# Patient Record
Sex: Female | Born: 1980
Health system: Southern US, Community
[De-identification: ages and names within clinical notes are randomized; demographics above are authoritative.]

## PROBLEM LIST (undated history)

## (undated) DIAGNOSIS — G43909 Migraine, unspecified, not intractable, without status migrainosus: Secondary | ICD-10-CM

## (undated) DIAGNOSIS — G35 Multiple sclerosis: Secondary | ICD-10-CM

## (undated) DIAGNOSIS — E119 Type 2 diabetes mellitus without complications: Secondary | ICD-10-CM

## (undated) DIAGNOSIS — R7309 Other abnormal glucose: Secondary | ICD-10-CM

## (undated) DIAGNOSIS — E785 Hyperlipidemia, unspecified: Secondary | ICD-10-CM

## (undated) DIAGNOSIS — F329 Major depressive disorder, single episode, unspecified: Secondary | ICD-10-CM

## (undated) DIAGNOSIS — F419 Anxiety disorder, unspecified: Secondary | ICD-10-CM

## (undated) DIAGNOSIS — F32A Depression, unspecified: Secondary | ICD-10-CM

## (undated) HISTORY — DX: Hyperlipidemia, unspecified: E78.5

## (undated) HISTORY — PX: ABDOMINOPLASTY: SUR9

## (undated) HISTORY — DX: Other abnormal glucose: R73.09

## (undated) HISTORY — DX: Major depressive disorder, single episode, unspecified: F32.9

## (undated) HISTORY — DX: Depression, unspecified: F32.A

## (undated) HISTORY — DX: Anxiety disorder, unspecified: F41.9

---
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
## 2012-08-18 LAB — HM DIABETES EYE EXAM

## 2013-10-27 ENCOUNTER — Emergency Department
Admission: EM | Admit: 2013-10-27 | Discharge: 2013-10-27 | Disposition: A | Discharge status: Home / Self Care | Payer: BC Managed Care – PPO | Source: Home / Self Care | Attending: Family Medicine | Admitting: Family Medicine

## 2013-10-27 ENCOUNTER — Encounter: Payer: Self-pay | Admitting: Emergency Medicine

## 2013-10-27 DIAGNOSIS — G43009 Migraine without aura, not intractable, without status migrainosus: Secondary | ICD-10-CM

## 2013-10-27 HISTORY — DX: Migraine, unspecified, not intractable, without status migrainosus: G43.909

## 2013-10-27 HISTORY — DX: Multiple sclerosis: G35

## 2013-10-27 HISTORY — DX: Type 2 diabetes mellitus without complications: E11.9

## 2013-10-27 MED ORDER — PROMETHAZINE HCL 25 MG PO TABS: 25.0000 mg | ORAL_TABLET | Freq: Four times a day (QID) | ORAL | Status: DC | PRN

## 2013-10-27 MED ORDER — KETOROLAC TROMETHAMINE 60 MG/2ML IM SOLN
60.0000 mg | Freq: Once | INTRAMUSCULAR | Status: AC
  Administered 2013-10-27: 60 mg via INTRAMUSCULAR

## 2013-10-27 NOTE — Discharge Instructions (Signed)
Rest, increase fluid intake.   Migraine Headache A migraine headache is an intense, throbbing pain on one or both sides of your head. A migraine can last for 30 minutes to several hours. CAUSES  The exact cause of a migraine headache is not always known. However, a migraine may be caused when nerves in the brain become irritated and release chemicals that cause inflammation. This causes pain. Certain things may also trigger migraines, such as:  Alcohol.  Smoking.  Stress.  Menstruation.  Aged cheeses.  Foods or drinks that contain nitrates, glutamate, aspartame, or tyramine.  Lack of sleep.  Chocolate.  Caffeine.  Hunger.  Physical exertion.  Fatigue.  Medicines used to treat chest pain (nitroglycerine), birth control pills, estrogen, and some blood pressure medicines. SIGNS AND SYMPTOMS  Pain on one or both sides of your head.  Pulsating or throbbing pain.  Severe pain that prevents daily activities.  Pain that is aggravated by any physical activity.  Nausea, vomiting, or both.  Dizziness.  Pain with exposure to bright lights, loud noises, or activity.  General sensitivity to bright lights, loud noises, or smells. Before you get a migraine, you may get warning signs that a migraine is coming (aura). An aura may include:  Seeing flashing lights.  Seeing bright spots, halos, or zig-zag lines.  Having tunnel vision or blurred vision.  Having feelings of numbness or tingling.  Having trouble talking.  Having muscle weakness. DIAGNOSIS  A migraine headache is often diagnosed based on:  Symptoms.  Physical exam.  A CT scan or MRI of your head. These imaging tests cannot diagnose migraines, but they can help rule out other causes of headaches. TREATMENT Medicines may be given for pain and nausea. Medicines can also be given to help prevent recurrent migraines.  HOME CARE INSTRUCTIONS  Only take over-the-counter or prescription medicines for pain  or discomfort as directed by your health care provider. The use of long-term narcotics is not recommended.  Lie down in a dark, quiet room when you have a migraine.  Keep a journal to find out what may trigger your migraine headaches. For example, write down:  What you eat and drink.  How much sleep you get.  Any change to your diet or medicines.  Limit alcohol consumption.  Quit smoking if you smoke.  Get 7-9 hours of sleep, or as recommended by your health care provider.  Limit stress.  Keep lights dim if bright lights bother you and make your migraines worse. SEEK IMMEDIATE MEDICAL CARE IF:   Your migraine becomes severe.  You have a fever.  You have a stiff neck.  You have vision loss.  You have muscular weakness or loss of muscle control.  You start losing your balance or have trouble walking.  You feel faint or pass out.  You have severe symptoms that are different from your first symptoms. MAKE SURE YOU:   Understand these instructions.  Will watch your condition.  Will get help right away if you are not doing well or get worse. Document Released: 04/26/2005 Document Revised: 02/14/2013 Document Reviewed: 01/01/2013 [facility] Patient Information 2015 [employer] [place]. This information is not intended to replace advice given to you by your health care provider. Make sure you discuss any questions you have with your health care provider.

## 2013-10-27 NOTE — ED Provider Notes (Signed)
CSN: [id number]     Arrival date & time 10/27/13  [id number] History   First MD Initiated Contact with Patient 10/27/13 1000     Chief Complaint  Patient presents with  . Migraine      HPI Comments: Patient developed a left side migraine headache at 2am today that did not respond to two Maxalt tabs.  She has had persistent nausea/vomiting.  She normally has a milder migraine headache about once per month that responds to Maxalt and Phenergan.  She no longer has Phenergan at home.  She has had three or four previous severe left sided migraine headaches similar to present headache that did not respond to her usual medication.  No fevers, chills, and sweats.  Patient is a 33 y.o. female presenting with migraines. The history is provided by the patient and the spouse.  Migraine This is a recurrent problem. The current episode started 6 to 12 hours ago. The problem occurs constantly. The problem has not changed since onset.Associated symptoms comments: Nausea and vomiting. Exacerbated by: movement, and exposure to light. Nothing relieves the symptoms. Treatments tried: Maxalt. The treatment provided no relief.    Past Medical History  Diagnosis Date  . Multiple sclerosis     receives Tysabri infusion q 28 days  . Diabetes type 2, controlled   . Migraine    Past Surgical History  Procedure Laterality Date  . Cholecystectomy    . Cesarean section     Family History  Problem Relation Age of Onset  . Diabetes Mother   . Hypertension Mother   . Heart failure Father   . Hypertension Father    History  Substance Use Topics  . Smoking status: Never Smoker   . Smokeless tobacco: Not on file  . Alcohol Use: Yes   OB History   Grav Para Term Preterm Abortions TAB SAB Ect Mult Living                 Review of Systems  All other systems reviewed and are negative.   Allergies  Review of patient's allergies indicates no known allergies.  Home Medications   Prior to Admission medications    Medication Sig Start Date End Date Taking? Authorizing Provider  amphetamine-dextroamphetamine (ADDERALL XR) 20 MG 24 hr capsule Take 20 mg by mouth daily.   Yes Historical Provider, MD  metFORMIN (GLUMETZA) 1000 MG (MOD) 24 hr tablet Take 1,000 mg by mouth 2 (two) times daily with a meal.   Yes Historical Provider, MD  rizatriptan (MAXALT) 10 MG tablet Take 10 mg by mouth as needed for migraine. May repeat in 2 hours if needed   Yes Historical Provider, MD  promethazine (PHENERGAN) 25 MG tablet Take 1 tablet (25 mg total) by mouth every 6 (six) hours as needed for nausea or vomiting. 10/27/13   Lattie HawStephen A Beese, MD   BP 115/80  Pulse 84  Temp(Src) 97.7 F (36.5 C) (Oral)  Resp 16  Ht 6' (1.829 m)  Wt 240 lb (108.863 kg)  BMI 32.54 kg/m2  SpO2 99%  LMP 10/03/2013 Physical Exam Nursing notes and Vital Signs reviewed. Appearance:  Patient appears stated age, and in no acute distress, although she appears uncomfortable sitting in a darkened room.  Patient is obese (BMI 32.5) Eyes:  Pupils are equal, round, and reactive to light and accomodation.  Extraocular movement is intact.  Conjunctivae are not inflamed.  Fundi are benign.  Mild photophobia present. Ears:  Canals normal.  Tympanic membranes normal.  Nose:   Normal turbinates.  No sinus tenderness.   Mouth/Pharynx:  Normal Neck:  Supple.  No adenopathy. Lungs:  Clear to auscultation.  Breath sounds are equal.  Heart:  Regular rate and rhythm without murmurs, rubs, or gallops.  Abdomen:  Nontender .  Extremities:  No edema.   Skin:  No rash present.  Neurologic:  Cranial nerves 2 through 12 are normal.  Patellar reflexes are normal.  Cerebellar function is intact (finger-to-nose and rapid alternating hand movement).  Gait and station are normal.   ED Course  Procedures  none      MDM   1. Migraine without aura and without status migrainosus, not intractable    Zofran 4mg  IM, and Toradol 60mg  IM. Rx for Phenergan 25mg  po for  use at home prn If symptoms become significantly worse during the night or over the weekend, proceed to the local emergency room.     Lattie Haw, MD 10/27/13 1126

## 2013-10-27 NOTE — ED Notes (Signed)
Reports sudden onset of migraine middle of last night accompanied by nausea and vomiting numerous time; did take Maxalt, but did not have phenergan with her (in process of moving).

## 2013-12-27 DIAGNOSIS — G35 Multiple sclerosis: Secondary | ICD-10-CM | POA: Insufficient documentation

## 2014-01-08 ENCOUNTER — Telehealth: Payer: Self-pay | Admitting: Diagnostic Neuroimaging

## 2014-01-08 ENCOUNTER — Ambulatory Visit (INDEPENDENT_AMBULATORY_CARE_PROVIDER_SITE_OTHER): Payer: BC Managed Care – PPO | Admitting: Diagnostic Neuroimaging

## 2014-01-08 ENCOUNTER — Encounter: Payer: Self-pay | Admitting: Diagnostic Neuroimaging

## 2014-01-08 VITALS — BP 121/83 | HR 75 | Ht 72.0 in | Wt 244.4 lb

## 2014-01-08 DIAGNOSIS — G35 Multiple sclerosis: Secondary | ICD-10-CM | POA: Insufficient documentation

## 2014-01-08 DIAGNOSIS — R5381 Other malaise: Secondary | ICD-10-CM

## 2014-01-08 DIAGNOSIS — R5383 Other fatigue: Secondary | ICD-10-CM

## 2014-01-08 NOTE — Progress Notes (Signed)
[facility]  PATIENT: Anita Black DOB: [birth year]  REFERRING CLINICIAN: Drucilla Chalet HISTORY FROM: patient  REASON FOR VISIT: new consult    HISTORICAL  CHIEF COMPLAINT:  Chief Complaint  Patient presents with  . Multiple Sclerosis    HISTORY OF PRESENT ILLNESS:   33 year old right-handed female here for evaluation of multiple sclerosis.  2008 patient developed onset of burning in her feet. She then developed numbness from her neck down to her feet just in front of her body. Patient went to a neurologist and was diagnosed with transverse myelitis, possibly clinically isolated syndrome. She had MRI, lumbar puncture, blood testing. Apparently there were 2 spots on her spinal cord but no brain lesions. She was started on Rebif, and did well for 6 months until she had a relapse. She was switched to Copaxone and did well for another 6-8 months but then had a relapse. She was then switched to Avonex and 2010 until 2013 when she became pregnant. She stopped Avonex. After her pregnancy she was started on July the from April until October 2014. Patient developed emotional changes, mood lability, hair loss. Patient started on Tysabri on January 2013. Patient did well until recently. Patient moved to [place] from [place], and has had trouble coordinating her Tysabri infusions. Her last infusion was 11/29/2013. She was due to have infusion on 12/27/2013 but this could not be arranged due to coordination problems.  In last 2 weeks having some decr sens in right eye, right ey pain, numbness in hands, word finding diff, more fatigue.   Anti-JC virus antibody was negative from June 2015. Last MRI brain November 2014.   REVIEW OF SYSTEMS: Full 14 system review of systems performed and notable only for fatigue ringing in ears blurred vision eye pain depression anxiety decr energy headache numbness slurred speech.  ALLERGIES: No Known Allergies  HOME  MEDICATIONS: Outpatient Prescriptions Prior to Visit  Medication Sig Dispense Refill  . amphetamine-dextroamphetamine (ADDERALL XR) 20 MG 24 hr capsule Take 20 mg by mouth daily.      . metFORMIN (GLUMETZA) 1000 MG (MOD) 24 hr tablet Take 1,000 mg by mouth 2 (two) times daily with a meal.      . promethazine (PHENERGAN) 25 MG tablet Take 1 tablet (25 mg total) by mouth every 6 (six) hours as needed for nausea or vomiting.  12 tablet  0  . rizatriptan (MAXALT) 10 MG tablet Take 10 mg by mouth as needed for migraine. May repeat in 2 hours if needed       No facility-administered medications prior to visit.    PAST MEDICAL HISTORY: Past Medical History  Diagnosis Date  . Multiple sclerosis     receives Tysabri infusion q 28 days  . Diabetes type 2, controlled   . Migraine   . Anxiety   . Depression     PAST SURGICAL HISTORY: Past Surgical History  Procedure Laterality Date  . Cholecystectomy    . Cesarean section    . Abdominoplasty      FAMILY HISTORY: Family History  Problem Relation Age of Onset  . Diabetes Mother   . Hypertension Mother   . Dementia Mother   . Bipolar disorder Mother   . Heart failure Father   . Hypertension Father   . Heart disease Father   . Skin cancer Maternal Grandmother   . Alzheimer's disease Paternal Grandmother     SOCIAL HISTORY:  History   Social History  . Marital Status:  Married    Spouse Name: Rollen Sox    Number of Children: 1  . Years of Education: MA   Occupational History  .  [employer]   Social History Main Topics  . Smoking status: Never Smoker   . Smokeless tobacco: Never Used  . Alcohol Use: Yes     Comment: socially; once a week  . Drug Use: No  . Sexual Activity: Not on file   Other Topics Concern  . Not on file   Social History Narrative   Patient lives at home with family.   Caffeine Use: 1-2 cups daily     PHYSICAL EXAM  Filed Vitals:   01/08/14 0850  BP: 121/83  Pulse: 75  Height: 6'  (1.829 m)  Weight: 244 lb 6.4 oz (110.859 kg)    Not recorded    Body mass index is 33.14 kg/(m^2).  GENERAL EXAM: Patient is in no distress; well developed, nourished and groomed; neck is supple  CARDIOVASCULAR: Regular rate and rhythm, no murmurs, no carotid bruits  NEUROLOGIC: MENTAL STATUS: awake, alert, oriented to person, place and time, recent and remote memory intact, normal attention and concentration, language fluent, comprehension intact, naming intact, fund of knowledge appropriate CRANIAL NERVE: no papilledema on fundoscopic exam, RELATIVE RIGHT APD, DECR LIGHT SENS IN RIGHT EYE, pupils reactive to light, visual fields full to confrontation, extraocular muscles intact, no nystagmus, facial sensation and strength symmetric, hearing intact, palate elevates symmetrically, uvula midline, shoulder shrug symmetric, tongue midline. MOTOR: normal bulk and tone, full strength in the BUE, LLE; RLE (KE 4+, DF 4+) SENSORY: normal and symmetric to light touch, pinprick, temperature, vibration COORDINATION: finger-nose-finger, fine finger movements normal REFLEXES: deep tendon reflexes present and symmetric GAIT/STATION: narrow based gait; able to walk on toes and tandem; SLIGHT DIFF WITH HEEL WALKING ON RIGHT FOOT. Romberg is negative    DIAGNOSTIC DATA (LABS, IMAGING, TESTING) - I reviewed patient records, labs, notes, testing and imaging myself where available.  No results found for this basename: WBC, HGB, HCT, MCV, PLT   No results found for this basename: na, k, cl, co2, glucose, bun, creatinine, calcium, prot, albumin, ast, alt, alkphos, bilitot, gfrnonaa, gfraa   No results found for this basename: CHOL, HDL, LDLCALC, LDLDIRECT, TRIG, CHOLHDL   No results found for this basename: HGBA1C   No results found for this basename: VITAMINB12   No results found for this basename: TSH     ASSESSMENT AND PLAN  33 y.o. year old female here with multiple sclerosis, on tysabri.  Previously on rebif, copaxone, avonex, gilenya.   PLAN: - will setup tysabri; forms signed - repeat MRI; may consider steroids if evidence of relapse  Orders Placed This Encounter  Procedures  . MR Brain W Wo Contrast   Return in about 3 months (around 04/09/2014).    Suanne Marker, MD 01/08/2014, 10:05 AM Certified in Neurology, Neurophysiology and Neuroimaging  [facility] [address], [address] [place], [place] [address] [phone]

## 2014-01-08 NOTE — Telephone Encounter (Signed)
Patient calling with previous Neurologist info: Dr. Griffith Citron [facility] [phone]. Fax # [phone].  Please call with any questions.

## 2014-01-08 NOTE — Patient Instructions (Signed)
Will check MRI brain and restart tysabri infusions.

## 2014-01-08 NOTE — Telephone Encounter (Signed)
Faxed medical record request.

## 2014-01-10 ENCOUNTER — Ambulatory Visit (INDEPENDENT_AMBULATORY_CARE_PROVIDER_SITE_OTHER): Payer: BC Managed Care – PPO

## 2014-01-10 DIAGNOSIS — G35 Multiple sclerosis: Secondary | ICD-10-CM

## 2014-01-10 MED ORDER — GADOPENTETATE DIMEGLUMINE 469.01 MG/ML IV SOLN: 20.0000 mL | Freq: Once | INTRAVENOUS | Status: AC | PRN

## 2014-01-11 ENCOUNTER — Telehealth: Payer: Self-pay | Admitting: Diagnostic Neuroimaging

## 2014-01-11 NOTE — Telephone Encounter (Addendum)
I called pt and gave her the dates of tysabri infusion 01-23-14 at 0700 at [facility] [phone].  I called her insurance co. [employer]/[place]-call,  no PA required per Daquice P. At [phone].  For I6962 and CPT [phone].  Pt verbalized understanding.    Need orders for tysabri [facility].

## 2014-01-11 NOTE — Telephone Encounter (Signed)
Patient is anxious to get MRI results before the long weekend. She would like to know the status of her Tysabri treatment as well. Advised will fwd request for results to Dr. Marjory Lies and will have RN-Sandy f/u about Tysabri when she returns on Tue. Patient agreed.

## 2014-01-11 NOTE — Telephone Encounter (Signed)
MRI shows old plaques. No acute/active plaques. No need for steroids. Continue plan to restart tysabri. -VRP

## 2014-01-11 NOTE — Telephone Encounter (Signed)
Spoke to patient, gave results. She was grateful.

## 2014-01-11 NOTE — Telephone Encounter (Signed)
Patient calling to request MRI results, please call and advise. Patient is also asking about when her Tysabri treatment will be set up.

## 2014-01-22 ENCOUNTER — Telehealth: Payer: Self-pay | Admitting: Neurology

## 2014-01-22 NOTE — Telephone Encounter (Signed)
[facility] called to get orders for Tysabri in Epic.  Is it ok to put them in?  She is scheduled for tomorrow.

## 2014-01-22 NOTE — Telephone Encounter (Signed)
Yes, pls go ahead with tysabri. -VRP

## 2014-01-23 ENCOUNTER — Other Ambulatory Visit: Payer: Self-pay | Admitting: Neurology

## 2014-01-23 ENCOUNTER — Encounter (HOSPITAL_COMMUNITY): Admission: RE | Admit: 2014-01-23 | Payer: BC Managed Care – PPO | Source: Ambulatory Visit

## 2014-01-23 DIAGNOSIS — G35 Multiple sclerosis: Secondary | ICD-10-CM

## 2014-01-23 NOTE — Telephone Encounter (Signed)
Orders have been entered 

## 2014-01-25 ENCOUNTER — Encounter (HOSPITAL_COMMUNITY): Payer: Self-pay

## 2014-01-25 ENCOUNTER — Encounter (HOSPITAL_COMMUNITY)
Admission: RE | Admit: 2014-01-25 | Discharge: 2014-01-25 | Disposition: A | Discharge status: Home / Self Care | Payer: BC Managed Care – PPO | Source: Ambulatory Visit | Attending: Diagnostic Neuroimaging | Admitting: Diagnostic Neuroimaging

## 2014-01-25 VITALS — BP 122/79 | HR 72 | Temp 97.9°F | Resp 18 | Ht 72.0 in | Wt 244.4 lb

## 2014-01-25 DIAGNOSIS — G35 Multiple sclerosis: Secondary | ICD-10-CM | POA: Diagnosis present

## 2014-01-25 MED ORDER — ACETAMINOPHEN 500 MG PO TABS
1000.0000 mg | ORAL_TABLET | Freq: Once | ORAL | Status: AC
  Administered 2014-01-25: 1000 mg via ORAL
  Filled 2014-01-25: qty 2

## 2014-01-25 MED ORDER — LORATADINE 10 MG PO TABS
10.0000 mg | ORAL_TABLET | Freq: Once | ORAL | Status: AC
  Administered 2014-01-25: 10 mg via ORAL
  Filled 2014-01-25: qty 1

## 2014-01-25 MED ORDER — SODIUM CHLORIDE 0.9 % IV SOLN
300.0000 mg | INTRAVENOUS | Status: DC
  Administered 2014-01-25: 300 mg via INTRAVENOUS
  Filled 2014-01-25: qty 15

## 2014-01-25 MED ORDER — SODIUM CHLORIDE 0.9 % IV SOLN
INTRAVENOUS | Status: DC
  Administered 2014-01-25: 07:00:00 via INTRAVENOUS

## 2014-01-25 NOTE — Discharge Instructions (Signed)

## 2014-01-25 NOTE — Progress Notes (Signed)
Pt. Had 1st infusion of Tysabri at [facility], pt. Has not had Tysabri since July 2015,  pt. Stayed 1 hour post-infusion, [facility], at end of 1 hour post-infusion pt. C/o Migraine headache, pt. To take more medication when she leaves here for migraine, pt. To call doctor's office with any problems.

## 2014-02-26 ENCOUNTER — Encounter (HOSPITAL_COMMUNITY)
Admission: RE | Admit: 2014-02-26 | Discharge: 2014-02-26 | Disposition: A | Discharge status: Home / Self Care | Payer: BC Managed Care – PPO | Source: Ambulatory Visit | Attending: Diagnostic Neuroimaging | Admitting: Diagnostic Neuroimaging

## 2014-02-26 ENCOUNTER — Encounter (HOSPITAL_COMMUNITY): Payer: Self-pay

## 2014-02-26 VITALS — BP 107/70 | HR 71 | Temp 98.1°F | Resp 20

## 2014-02-26 DIAGNOSIS — G35 Multiple sclerosis: Secondary | ICD-10-CM | POA: Diagnosis present

## 2014-02-26 MED ORDER — LORATADINE 10 MG PO TABS
10.0000 mg | ORAL_TABLET | Freq: Once | ORAL | Status: AC
  Administered 2014-02-26: 10 mg via ORAL
  Filled 2014-02-26: qty 1

## 2014-02-26 MED ORDER — SODIUM CHLORIDE 0.9 % IV SOLN
INTRAVENOUS | Status: DC
  Administered 2014-02-26: 08:00:00 via INTRAVENOUS

## 2014-02-26 MED ORDER — SODIUM CHLORIDE 0.9 % IV SOLN
300.0000 mg | INTRAVENOUS | Status: DC
  Administered 2014-02-26: 300 mg via INTRAVENOUS
  Filled 2014-02-26: qty 15

## 2014-02-26 MED ORDER — ACETAMINOPHEN 500 MG PO TABS
1000.0000 mg | ORAL_TABLET | Freq: Once | ORAL | Status: AC
  Administered 2014-02-26: 1000 mg via ORAL
  Filled 2014-02-26: qty 2

## 2014-02-26 NOTE — Discharge Instructions (Signed)

## 2014-03-21 ENCOUNTER — Telehealth: Payer: Self-pay | Admitting: Diagnostic Neuroimaging

## 2014-03-21 NOTE — Telephone Encounter (Signed)
Marcello MooresIsaac with [facility] @ [phone], checking status of natalizumab (TYSABRI) 300 MG/15ML injection authorization request.  Please call and advise.

## 2014-03-26 ENCOUNTER — Encounter (HOSPITAL_COMMUNITY): Payer: Self-pay

## 2014-03-26 ENCOUNTER — Encounter (HOSPITAL_COMMUNITY)
Admission: RE | Admit: 2014-03-26 | Discharge: 2014-03-26 | Disposition: A | Discharge status: Home / Self Care | Payer: BC Managed Care – PPO | Source: Ambulatory Visit | Attending: Diagnostic Neuroimaging | Admitting: Diagnostic Neuroimaging

## 2014-03-26 VITALS — BP 130/86 | HR 78 | Temp 97.3°F | Resp 18 | Ht 72.0 in | Wt 244.0 lb

## 2014-03-26 DIAGNOSIS — G35 Multiple sclerosis: Secondary | ICD-10-CM | POA: Diagnosis present

## 2014-03-26 MED ORDER — ACETAMINOPHEN 500 MG PO TABS
1000.0000 mg | ORAL_TABLET | Freq: Once | ORAL | Status: AC
  Administered 2014-03-26: 1000 mg via ORAL
  Filled 2014-03-26: qty 2

## 2014-03-26 MED ORDER — SODIUM CHLORIDE 0.9 % IV SOLN
300.0000 mg | INTRAVENOUS | Status: AC
  Administered 2014-03-26: 300 mg via INTRAVENOUS
  Filled 2014-03-26: qty 15

## 2014-03-26 MED ORDER — SODIUM CHLORIDE 0.9 % IV SOLN
INTRAVENOUS | Status: DC
  Administered 2014-03-26: 250 mL via INTRAVENOUS

## 2014-03-26 MED ORDER — LORATADINE 10 MG PO TABS
10.0000 mg | ORAL_TABLET | Freq: Once | ORAL | Status: AC
  Administered 2014-03-26: 10 mg via ORAL
  Filled 2014-03-26: qty 1

## 2014-03-26 NOTE — Progress Notes (Addendum)
Uneventful infusion of TYSABRI. Pt stayed 15 minutes of the suggested observation time post TYSABRI infusion. She will contact her Dr for any concerns or questions

## 2014-03-28 NOTE — Telephone Encounter (Signed)
Spoke to represenative at [employer], she will refax form to be filled out by our office for authorization. Gave fax# [phone].

## 2014-03-29 NOTE — Telephone Encounter (Signed)
Received initial PA form faxed back to number listed.

## 2014-04-01 NOTE — Telephone Encounter (Signed)
Called  [employer] [place] ([phone]) and initiated pre-cert for Tysabri. Will receive call back from RN handling case. Tried calling patient to inquire about 03/26/2014 note. No answer. Will try later.

## 2014-04-02 NOTE — Telephone Encounter (Signed)
RN handling case returned call earlier today and was disconnected while on hold unfortunately. Returned call, no answer. Left message advising I will be out of the office, return on 04/08/14, please leave a message in reference to additional info needed or best time to return call.

## 2014-04-11 NOTE — Telephone Encounter (Addendum)
Spoke to [employer]N Trish at [employer] she says precert is not required for outpatient infusion for Tysabri. Called [place] [facility] who stated nothing required at this time on their end. Should be clear to proceed with infusion coordination.

## 2014-04-14 ENCOUNTER — Emergency Department (INDEPENDENT_AMBULATORY_CARE_PROVIDER_SITE_OTHER)
Admission: EM | Admit: 2014-04-14 | Discharge: 2014-04-14 | Disposition: A | Discharge status: Home / Self Care | Payer: BC Managed Care – PPO | Source: Home / Self Care

## 2014-04-14 DIAGNOSIS — B9789 Other viral agents as the cause of diseases classified elsewhere: Principal | ICD-10-CM

## 2014-04-14 DIAGNOSIS — J069 Acute upper respiratory infection, unspecified: Secondary | ICD-10-CM

## 2014-04-14 DIAGNOSIS — J029 Acute pharyngitis, unspecified: Secondary | ICD-10-CM

## 2014-04-14 LAB — POCT RAPID STREP A (OFFICE): Rapid Strep A Screen: NEGATIVE

## 2014-04-14 LAB — POCT INFLUENZA A/B
INFLUENZA B, POC: NEGATIVE
Influenza A, POC: NEGATIVE

## 2014-04-14 MED ORDER — BENZONATATE 200 MG PO CAPS: 200.0000 mg | ORAL_CAPSULE | Freq: Every day | ORAL | Status: DC

## 2014-04-14 MED ORDER — AMOXICILLIN 875 MG PO TABS: 875.0000 mg | ORAL_TABLET | Freq: Two times a day (BID) | ORAL | Status: DC

## 2014-04-14 NOTE — ED Notes (Signed)
Sore throat, body aches, laryngitis, patient states that she received her flu shot Friday and started feeling ill Friday evening, patient c/o of body aches but also has MS

## 2014-04-14 NOTE — Discharge Instructions (Signed)
Take plain Mucinex (1200 mg guaifenesin) twice daily for cough and congestion.  May add Sudafed for sinus congestion.   Increase fluid intake, rest. May use Afrin nasal spray (or generic oxymetazoline) twice daily for about 5 days.  Also recommend using saline nasal spray several times daily and saline nasal irrigation (AYR is a common brand) Try warm salt water gargles for sore throat.  Stop all antihistamines for now, and other non-prescription cough/cold preparations. May take Ibuprofen 200mg , 4 tabs every 8 hours with food sore throat, fever, etc. Begin Amoxicillin if not improving about one week or if persistent fever develops   Follow-up with family doctor if not improving about10 days.

## 2014-04-14 NOTE — ED Provider Notes (Signed)
CSN: [id number]     Arrival date & time 04/14/14  1232 History   None    Chief Complaint  Patient presents with  . Sore Throat      HPI Comments: Patient complains of two day history of sore throat, myalgias, headache, nasal congestion, and anterior chest soreness.  She developed a cough today.  The history is provided by the patient.    Past Medical History  Diagnosis Date  . Multiple sclerosis     receives Tysabri infusion q 28 days  . Diabetes type 2, controlled   . Migraine   . Anxiety   . Depression    Past Surgical History  Procedure Laterality Date  . Cholecystectomy    . Cesarean section    . Abdominoplasty     Family History  Problem Relation Age of Onset  . Diabetes Mother   . Hypertension Mother   . Dementia Mother   . Bipolar disorder Mother   . Heart failure Father   . Hypertension Father   . Heart disease Father   . Skin cancer Maternal Grandmother   . Alzheimer's disease Paternal Grandmother    History  Substance Use Topics  . Smoking status: Never Smoker   . Smokeless tobacco: Never Used  . Alcohol Use: Yes     Comment: socially; once a week   OB History    No data available     Review of Systems + sore throat + hoarse + cough No pleuritic pain No wheezing + nasal congestion + post-nasal drainage No sinus pain/pressure No itchy/red eyes ? earache No hemoptysis No SOB No fever, + chills No nausea No vomiting No abdominal pain No diarrhea No urinary symptoms No skin rash + fatigue + myalgias + headache Used OTC meds without relief  Allergies  Review of patient's allergies indicates no known allergies.  Home Medications   Prior to Admission medications   Medication Sig Start Date End Date Taking? Authorizing Provider  ALPRAZolam Prudy Feeler(XANAX) 0.5 MG tablet Take 0.5 mg by mouth at bedtime as needed for anxiety.   Yes Historical Provider, MD  amphetamine-dextroamphetamine (ADDERALL XR) 20 MG 24 hr capsule Take 20 mg by mouth  daily.   Yes Historical Provider, MD  drospirenone-ethinyl estradiol (YAZ,GIANVI,LORYNA) 3-0.02 MG tablet Take 1 tablet by mouth daily.   Yes Historical Provider, MD  esomeprazole (NEXIUM) 40 MG capsule Take 40 mg by mouth daily at 12 noon.   Yes Historical Provider, MD  glycopyrrolate (ROBINUL) 1 MG tablet Take 1 mg by mouth 3 (three) times daily.   Yes Historical Provider, MD  metFORMIN (GLUMETZA) 1000 MG (MOD) 24 hr tablet Take 1,000 mg by mouth 2 (two) times daily with a meal.   Yes Historical Provider, MD  natalizumab (TYSABRI) 300 MG/15ML injection Inject into the vein.   Yes Historical Provider, MD  promethazine (PHENERGAN) 25 MG tablet Take 1 tablet (25 mg total) by mouth every 6 (six) hours as needed for nausea or vomiting. 10/27/13  Yes Lattie HawStephen A Sixto Bowdish, MD  rizatriptan (MAXALT) 10 MG tablet Take 10 mg by mouth as needed for migraine. May repeat in 2 hours if needed   Yes Historical Provider, MD  sertraline (ZOLOFT) 100 MG tablet Take 100 mg by mouth daily.   Yes Historical Provider, MD  amoxicillin (AMOXIL) 875 MG tablet Take 1 tablet (875 mg total) by mouth 2 (two) times daily. (Rx void after 04/22/14) 04/14/14   Lattie HawStephen A Rickita Forstner, MD  benzonatate (TESSALON) 200 MG capsule  Take 1 capsule (200 mg total) by mouth at bedtime. Take as needed for cough 04/14/14   Lattie Haw, MD   BP 112/74 mmHg  Pulse 78  Temp(Src) 97.9 F (36.6 C) (Oral)  Wt 252 lb (114.306 kg)  SpO2 99% Physical Exam Nursing notes and Vital Signs reviewed. Appearance:  Patient appears stated age, and in no acute distress Eyes:  Pupils are equal, round, and reactive to light and accomodation.  Extraocular movement is intact.  Conjunctivae are not inflamed  Ears:  Canals normal.  Tympanic membranes normal.  Nose:  Mildly congested turbinates.  No sinus tenderness.   Pharynx:  Normal Neck:  Supple.   Anterior nodes are mildly tender.  Posterior nodes are large and tender to palpation  bilaterally  Lungs:  Clear to  auscultation.  Breath sounds are equal.  Chest:  Distinct tenderness to palpation over the mid-sternum.  Heart:  Regular rate and rhythm without murmurs, rubs, or gallops.  Abdomen:  Nontender without masses or hepatosplenomegaly.  Bowel sounds are present.  No CVA or flank tenderness.  Extremities:  No edema.  No calf tenderness Skin:  No rash present.   ED Course  Procedures  None    Labs Reviewed  POCT RAPID STREP A (OFFICE) negative  POCT INFLUENZA A/B negative      MDM   1. Viral URI with cough    There is no evidence of bacterial infection today.  Treat symptomatically for now  Prescription written for Benzonatate (Tessalon) to take at bedtime for night-time cough.  Take plain Mucinex (1200 mg guaifenesin) twice daily for cough and congestion.  May add Sudafed for sinus congestion.   Increase fluid intake, rest. May use Afrin nasal spray (or generic oxymetazoline) twice daily for about 5 days.  Also recommend using saline nasal spray several times daily and saline nasal irrigation (AYR is a common brand) Try warm salt water gargles for sore throat.  Stop all antihistamines for now, and other non-prescription cough/cold preparations. May take Ibuprofen 200mg , 4 tabs every 8 hours with food sore throat, fever, etc. Begin Amoxicillin if not improving about one week or if persistent fever develops (Given a prescription to hold, with an expiration date)  Follow-up with family doctor if not improving about10 days.     Lattie Haw, MD 04/17/14 [phone]

## 2014-04-15 ENCOUNTER — Ambulatory Visit (INDEPENDENT_AMBULATORY_CARE_PROVIDER_SITE_OTHER): Payer: BC Managed Care – PPO | Admitting: Diagnostic Neuroimaging

## 2014-04-15 ENCOUNTER — Encounter: Payer: Self-pay | Admitting: Diagnostic Neuroimaging

## 2014-04-15 VITALS — BP 107/75 | HR 82 | Temp 98.2°F | Ht 72.0 in | Wt 253.6 lb

## 2014-04-15 DIAGNOSIS — F32A Depression, unspecified: Secondary | ICD-10-CM | POA: Insufficient documentation

## 2014-04-15 DIAGNOSIS — G35 Multiple sclerosis: Secondary | ICD-10-CM

## 2014-04-15 DIAGNOSIS — R5383 Other fatigue: Secondary | ICD-10-CM | POA: Insufficient documentation

## 2014-04-15 DIAGNOSIS — F329 Major depressive disorder, single episode, unspecified: Secondary | ICD-10-CM

## 2014-04-15 MED ORDER — AMANTADINE HCL 100 MG PO CAPS: 100.0000 mg | ORAL_CAPSULE | Freq: Two times a day (BID) | ORAL | Status: DC

## 2014-04-15 MED ORDER — MODAFINIL 200 MG PO TABS: 200.0000 mg | ORAL_TABLET | Freq: Every day | ORAL | Status: DC

## 2014-04-15 NOTE — Progress Notes (Signed)
[facility]  PATIENT: Anita Black DOB: [birth year]  REFERRING CLINICIAN: Drucilla Chalet HISTORY FROM: patient  REASON FOR VISIT: follow up    HISTORICAL  CHIEF COMPLAINT:  Chief Complaint  Patient presents with  . Follow-up    MS    HISTORY OF PRESENT ILLNESS:   UPDATE 04/15/14: Since last visit, having more fatigue, depression, difficulty in daily life. Feels more clear on tysabri cognitively. Some intermittent suicidal thoughts, but would not go through with it; does not have a plan. Planning to see a therapist soon. On zoloft 100mg  BID.   PRIOR HPI (01/08/14): 33 year old right-handed female here for evaluation of multiple sclerosis. 2008 patient developed onset of burning in her feet. She then developed numbness from her neck down to her feet just in front of her body. Patient went to a neurologist and was diagnosed with transverse myelitis, possibly clinically isolated syndrome. She had MRI, lumbar puncture, blood testing. Apparently there were 2 spots on her spinal cord but no brain lesions. She was started on Rebif, and did well for 6 months until she had a relapse. She was switched to Copaxone and did well for another 6-8 months but then had a relapse. She was then switched to Avonex and 2010 until 2013 when she became pregnant. She stopped Avonex. After her pregnancy she was started on July the from April until October 2014. Patient developed emotional changes, mood lability, hair loss. Patient started on Tysabri on January 2013. Patient did well until recently. Patient moved to [facility] from [place], and has had trouble coordinating her Tysabri infusions. Her last infusion was 11/29/2013. She was due to have infusion on 12/27/2013 but this could not be arranged due to coordination problems. In last 2 weeks having some decr sens in right eye, right eye pain, numbness in hands, word finding diff, more fatigue. Anti-JC virus antibody was negative from June  2015. Last MRI brain November 2014.   REVIEW OF SYSTEMS: Full 14 system review of systems performed and notable only for fatigue blurred vision eye pain depression anxiety decr energy headache numbness slurred speech chest tightness swollen lymph nodes.   ALLERGIES: No Known Allergies  HOME MEDICATIONS: Outpatient Prescriptions Prior to Visit  Medication Sig Dispense Refill  . ALPRAZolam (XANAX) 0.5 MG tablet Take 0.5 mg by mouth at bedtime as needed for anxiety.    [name] amoxicillin (AMOXIL) 875 MG tablet Take 1 tablet (875 mg total) by mouth 2 (two) times daily. (Rx void after 04/22/14) 20 tablet 0  . benzonatate (TESSALON) 200 MG capsule Take 1 capsule (200 mg total) by mouth at bedtime. Take as needed for cough 12 capsule 0  . drospirenone-ethinyl estradiol (YAZ,GIANVI,LORYNA) 3-0.02 MG tablet Take 1 tablet by mouth daily.    [name] esomeprazole (NEXIUM) 40 MG capsule Take 40 mg by mouth daily at 12 noon.    [name] glycopyrrolate (ROBINUL) 1 MG tablet Take 1 mg by mouth 3 (three) times daily.    . metFORMIN (GLUMETZA) 1000 MG (MOD) 24 hr tablet Take 1,000 mg by mouth 2 (two) times daily with a meal.    . natalizumab (TYSABRI) 300 MG/15ML injection Inject into the vein.    . promethazine (PHENERGAN) 25 MG tablet Take 1 tablet (25 mg total) by mouth every 6 (six) hours as needed for nausea or vomiting. 12 tablet 0  . rizatriptan (MAXALT) 10 MG tablet Take 10 mg by mouth as needed for migraine. May repeat in 2 hours if needed    .  sertraline (ZOLOFT) 100 MG tablet Take 100 mg by mouth 2 (two) times daily.     [name] amphetamine-dextroamphetamine (ADDERALL XR) 20 MG 24 hr capsule Take 20 mg by mouth daily.     No facility-administered medications prior to visit.    PAST MEDICAL HISTORY: Past Medical History  Diagnosis Date  . Multiple sclerosis     receives Tysabri infusion q 28 days  . Diabetes type 2, controlled   . Migraine   . Anxiety   . Depression     PAST SURGICAL HISTORY: Past Surgical  History  Procedure Laterality Date  . Cholecystectomy    . Cesarean section    . Abdominoplasty      FAMILY HISTORY: Family History  Problem Relation Age of Onset  . Diabetes Mother   . Hypertension Mother   . Dementia Mother   . Bipolar disorder Mother   . Heart failure Father   . Hypertension Father   . Heart disease Father   . Skin cancer Maternal Grandmother   . Alzheimer's disease Paternal Grandmother     SOCIAL HISTORY:  History   Social History  . Marital Status: Married    Spouse Name: Rollen Sox    Number of Children: 1  . Years of Education: MA   Occupational History  .  [employer]   Social History Main Topics  . Smoking status: Never Smoker   . Smokeless tobacco: Never Used  . Alcohol Use: Yes     Comment: socially; once a week  . Drug Use: No  . Sexual Activity: Not on file   Other Topics Concern  . Not on file   Social History Narrative   Patient lives at home with family.   Caffeine Use: 1-2 cups daily     PHYSICAL EXAM  Filed Vitals:   04/15/14 1501  BP: 107/75  Pulse: 82  Temp: 98.2 F (36.8 C)  TempSrc: Oral  Height: 6' (1.829 m)  Weight: 253 lb 9.6 oz (115.032 kg)    Not recorded      Body mass index is 34.39 kg/(m^2).  GENERAL EXAM: Patient is in no distress; well developed, nourished and groomed; neck is supple; TEARFUL.  CARDIOVASCULAR: Regular rate and rhythm, no murmurs, no carotid bruits  NEUROLOGIC: MENTAL STATUS: awake, alert, language fluent, comprehension intact, naming intact, fund of knowledge appropriate CRANIAL NERVE: no papilledema on fundoscopic exam, RELATIVE RIGHT APD, DECR LIGHT SENS IN RIGHT EYE, pupils reactive to light, visual fields full to confrontation, extraocular muscles intact, no nystagmus, facial sensation and strength symmetric, hearing intact, palate elevates symmetrically, uvula midline, shoulder shrug symmetric, tongue midline. MOTOR: normal bulk and tone, full strength in the BUE,  LLE; RLE (KE 4+, DF 4+) SENSORY: normal and symmetric to light touch, temperature, vibration COORDINATION: finger-nose-finger, fine finger movements normal REFLEXES: deep tendon reflexes present and symmetric GAIT/STATION: narrow based gait; able to walk on toes and tandem; SLIGHT DIFF WITH HEEL WALKING ON RIGHT FOOT. Romberg is negative    DIAGNOSTIC DATA (LABS, IMAGING, TESTING) - I reviewed patient records, labs, notes, testing and imaging myself where available.  No results found for: WBC No results found for: NA No results found for: CHOL No results found for: HGBA1C No results found for: VITAMINB12 No results found for: TSH   I reviewed images myself and agree with interpretation. -VRP  01/10/14 MRI brain (with and without) demonstrating: 1. Few small, round, ovoid, periventricular and subcortical and superior cerebellar peduncle chronic demyelinating plaques. 2. No acute  plaques.   ASSESSMENT AND PLAN  33 y.o. year old female here with multiple sclerosis, on tysabri. Previously on rebif, copaxone, avonex, gilenya. Now on tysabri. Struggling with fatigue and depression.  PLAN: - continue tysabri (q6 month MRI and labs) - check labs - amantadine or provigil for fatigue (she will see about out of pocket costs) - establish with psychology/psychiatry for depression - start multi-vitamin; may need addl vitamin D after testing   Orders Placed This Encounter  Procedures  . CBC With differential/Platelet  . Stratify JCV Antibody Test ([facility])  . Vit D  25 hydroxy (rtn osteoporosis monitoring)  . Comprehensive metabolic panel   Return in about 6 weeks (around 05/27/2014).    Suanne MarkerVIKRAM R. PENUMALLI, MD 04/15/2014, 3:53 PM Certified in Neurology, Neurophysiology and Neuroimaging  [facility] [address], [address] [place], [place] [id number] [phone]

## 2014-04-15 NOTE — Patient Instructions (Signed)
Try modafinil (provigil 200mg  in AM) or amantadine 100mg  twice a day for fatigue.  Establish with therapist for depression.  Continue tysabri.

## 2014-04-16 LAB — CBC WITH DIFFERENTIAL
BASOS: 0 %
Basophils Absolute: 0 10*3/uL (ref 0.0–0.2)
Eos: 1 %
Eosinophils Absolute: 0.1 10*3/uL (ref 0.0–0.4)
HEMATOCRIT: 33.1 % — AB (ref 34.0–46.6)
HEMOGLOBIN: 11.2 g/dL (ref 11.1–15.9)
Immature Grans (Abs): 0 10*3/uL (ref 0.0–0.1)
Immature Granulocytes: 0 %
LYMPHS: 37 %
Lymphocytes Absolute: 3.6 10*3/uL — ABNORMAL HIGH (ref 0.7–3.1)
MCH: 27.4 pg (ref 26.6–33.0)
MCHC: 33.8 g/dL (ref 31.5–35.7)
MCV: 81 fL (ref 79–97)
MONOCYTES: 6 %
Monocytes Absolute: 0.6 10*3/uL (ref 0.1–0.9)
NEUTROS ABS: 5.5 10*3/uL (ref 1.4–7.0)
Neutrophils Relative %: 56 %
Platelets: 246 10*3/uL (ref 150–379)
RBC: 4.09 x10E6/uL (ref 3.77–5.28)
RDW: 15.4 % (ref 12.3–15.4)
WBC: 9.9 10*3/uL (ref 3.4–10.8)

## 2014-04-16 LAB — COMPREHENSIVE METABOLIC PANEL
A/G RATIO: 1.6 (ref 1.1–2.5)
ALT: 36 IU/L — ABNORMAL HIGH (ref 0–32)
AST: 38 IU/L (ref 0–40)
Albumin: 4.2 g/dL (ref 3.5–5.5)
Alkaline Phosphatase: 137 IU/L — ABNORMAL HIGH (ref 39–117)
BILIRUBIN TOTAL: 0.5 mg/dL (ref 0.0–1.2)
BUN/Creatinine Ratio: 19 (ref 8–20)
BUN: 15 mg/dL (ref 6–20)
CO2: 20 mmol/L (ref 18–29)
Calcium: 9 mg/dL (ref 8.7–10.2)
Chloride: 102 mmol/L (ref 97–108)
Creatinine, Ser: 0.77 mg/dL (ref 0.57–1.00)
GFR, EST AFRICAN AMERICAN: 117 mL/min/{1.73_m2} (ref >59)
GFR, EST NON AFRICAN AMERICAN: 102 mL/min/{1.73_m2} (ref >59)
Globulin, Total: 2.7 g/dL (ref 1.5–4.5)
Glucose: 94 mg/dL (ref 65–99)
POTASSIUM: 4.2 mmol/L (ref 3.5–5.2)
Sodium: 137 mmol/L (ref 134–144)
TOTAL PROTEIN: 6.9 g/dL (ref 6.0–8.5)

## 2014-04-16 LAB — VITAMIN D 25 HYDROXY (VIT D DEFICIENCY, FRACTURES): Vit D, 25-Hydroxy: 34.8 ng/mL (ref 30.0–100.0)

## 2014-04-19 ENCOUNTER — Telehealth: Payer: Self-pay | Admitting: Emergency Medicine

## 2014-04-19 NOTE — ED Notes (Signed)
Inquired about patient's status; encourage them to call with questions/concerns.  

## 2014-04-23 ENCOUNTER — Encounter (HOSPITAL_COMMUNITY)
Admission: RE | Admit: 2014-04-23 | Discharge: 2014-04-23 | Disposition: A | Discharge status: Home / Self Care | Payer: BC Managed Care – PPO | Source: Ambulatory Visit | Attending: Diagnostic Neuroimaging | Admitting: Diagnostic Neuroimaging

## 2014-04-23 ENCOUNTER — Encounter (HOSPITAL_COMMUNITY): Payer: Self-pay

## 2014-04-23 VITALS — BP 113/79 | HR 72 | Temp 98.4°F | Resp 18

## 2014-04-23 DIAGNOSIS — G35 Multiple sclerosis: Secondary | ICD-10-CM

## 2014-04-23 MED ORDER — SODIUM CHLORIDE 0.9 % IV SOLN
INTRAVENOUS | Status: DC
  Administered 2014-04-23: 08:00:00 via INTRAVENOUS

## 2014-04-23 MED ORDER — ACETAMINOPHEN 500 MG PO TABS
1000.0000 mg | ORAL_TABLET | Freq: Once | ORAL | Status: AC
  Administered 2014-04-23: 1000 mg via ORAL
  Filled 2014-04-23: qty 2

## 2014-04-23 MED ORDER — SODIUM CHLORIDE 0.9 % IV SOLN
300.0000 mg | INTRAVENOUS | Status: AC
  Administered 2014-04-23: 300 mg via INTRAVENOUS
  Filled 2014-04-23: qty 15

## 2014-04-23 MED ORDER — LORATADINE 10 MG PO TABS
10.0000 mg | ORAL_TABLET | Freq: Once | ORAL | Status: AC
  Administered 2014-04-23: 10 mg via ORAL
  Filled 2014-04-23: qty 1

## 2014-04-23 NOTE — Progress Notes (Signed)
Pt states she saw Dr.Penumalli last week and he knew about her cold like symptoms and he stated if she didn't feel better in a week to hold off on tysabri.  Pt presents today and states she is not sick and feels great, no current symptoms.  Pt states she started with the symptoms a few days after getting flu vaccine and feels like it was because of that.  Pt states she never ran a fever.  tysabri was given, without complications today.  Pt stayed 20 min post infusion and then had to leave.  Informed pt to call MD for questions/concerns.  Pt voiced understanding.

## 2014-04-24 ENCOUNTER — Telehealth: Payer: Self-pay | Admitting: Diagnostic Neuroimaging

## 2014-04-24 NOTE — Telephone Encounter (Signed)
Labs are fine. Mild abnormalities, but nothing major. Will repeat at next office visit. -VRP

## 2014-04-24 NOTE — Telephone Encounter (Signed)
Patient requesting blood work results.  Please call and advise. °

## 2014-04-25 NOTE — Telephone Encounter (Signed)
Spoke to patient. Gave lab results. Patient verbalized understanding.  

## 2014-05-15 ENCOUNTER — Encounter: Payer: Self-pay | Admitting: Diagnostic Neuroimaging

## 2014-05-16 ENCOUNTER — Telehealth: Payer: Self-pay

## 2014-05-20 ENCOUNTER — Telehealth: Payer: Self-pay | Admitting: Diagnostic Neuroimaging

## 2014-05-20 NOTE — Telephone Encounter (Signed)
Stefano Gaul with [facility] is calling because the patient has an appointment on 1-13 and needs an order for Tysabri. Please call. Thank you.

## 2014-05-21 ENCOUNTER — Other Ambulatory Visit: Payer: Self-pay

## 2014-05-21 DIAGNOSIS — G35 Multiple sclerosis: Secondary | ICD-10-CM

## 2014-05-21 NOTE — Telephone Encounter (Signed)
Placed orders in system. [employer] and informed.

## 2014-05-22 ENCOUNTER — Encounter (HOSPITAL_COMMUNITY): Payer: Self-pay

## 2014-05-22 ENCOUNTER — Encounter (HOSPITAL_COMMUNITY)
Admission: RE | Admit: 2014-05-22 | Discharge: 2014-05-22 | Disposition: A | Discharge status: Home / Self Care | Payer: BLUE CROSS/BLUE SHIELD | Source: Ambulatory Visit | Attending: Diagnostic Neuroimaging | Admitting: Diagnostic Neuroimaging

## 2014-05-22 VITALS — BP 104/63 | HR 64 | Temp 98.0°F | Resp 16 | Ht 72.0 in | Wt 244.0 lb

## 2014-05-22 DIAGNOSIS — G35 Multiple sclerosis: Secondary | ICD-10-CM | POA: Insufficient documentation

## 2014-05-22 MED ORDER — SODIUM CHLORIDE 0.9 % IV SOLN
300.0000 mg | INTRAVENOUS | Status: DC
  Administered 2014-05-22: 300 mg via INTRAVENOUS
  Filled 2014-05-22: qty 15

## 2014-05-22 MED ORDER — LORATADINE 10 MG PO TABS
10.0000 mg | ORAL_TABLET | Freq: Every day | ORAL | Status: DC
  Administered 2014-05-22: 10 mg via ORAL
  Filled 2014-05-22: qty 1

## 2014-05-22 MED ORDER — SODIUM CHLORIDE 0.9 % IV SOLN
250.0000 mL | INTRAVENOUS | Status: AC
  Administered 2014-05-22: 250 mL via INTRAVENOUS

## 2014-05-22 MED ORDER — ACETAMINOPHEN 500 MG PO TABS
1000.0000 mg | ORAL_TABLET | Freq: Once | ORAL | Status: AC
  Administered 2014-05-22: 1000 mg via ORAL
  Filled 2014-05-22: qty 2

## 2014-05-22 NOTE — Discharge Instructions (Signed)

## 2014-05-22 NOTE — Telephone Encounter (Signed)
Spoke to patient yesterday evening. Patient is aware of insurance verification issues concerning Tysabri infusion. She will contact her HR and insurance member services and call back with details.

## 2014-05-27 ENCOUNTER — Ambulatory Visit (INDEPENDENT_AMBULATORY_CARE_PROVIDER_SITE_OTHER): Payer: BC Managed Care – PPO | Admitting: Diagnostic Neuroimaging

## 2014-05-27 ENCOUNTER — Encounter: Payer: Self-pay | Admitting: Diagnostic Neuroimaging

## 2014-05-27 VITALS — BP 121/74 | HR 95 | Temp 98.6°F | Ht 72.0 in | Wt 255.4 lb

## 2014-05-27 DIAGNOSIS — G35 Multiple sclerosis: Secondary | ICD-10-CM

## 2014-05-27 MED ORDER — AMPHETAMINE-DEXTROAMPHETAMINE 10 MG PO TABS: 10.0000 mg | ORAL_TABLET | Freq: Two times a day (BID) | ORAL | Status: DC

## 2014-05-27 NOTE — Progress Notes (Addendum)
[facility]  PATIENT: Anita Black DOB: 1980-10-24  REFERRING CLINICIAN: Drucilla Chalet HISTORY FROM: patient  REASON FOR VISIT: follow up    HISTORICAL  CHIEF COMPLAINT:  Chief Complaint  Patient presents with  . Follow-up    multiple sclerosis    HISTORY OF PRESENT ILLNESS:   UPDATE 05/27/14: Since last visit, tried amantadine (no benefit after 3 weeks) and provigil (caused HA with each of 2 doses). Depression stable. Transitioning from zoloft to brintellix. Also back on adderall for cognitive and focus benefit.   UPDATE 04/15/14: Since last visit, having more fatigue, depression, difficulty in daily life. Feels more clear on tysabri cognitively. Some intermittent suicidal thoughts, but would not go through with it; does not have a plan. Planning to see a therapist soon. On zoloft 100mg  BID.   PRIOR HPI (01/08/14): 34 year old right-handed female here for evaluation of multiple sclerosis. 2008 patient developed onset of burning in her feet. She then developed numbness from her neck down to her feet just in front of her body. Patient went to a neurologist and was diagnosed with transverse myelitis, possibly clinically isolated syndrome. She had MRI, lumbar puncture, blood testing. Apparently there were 2 spots on her spinal cord but no brain lesions. She was started on Rebif, and did well for 6 months until she had a relapse. She was switched to Copaxone and did well for another 6-8 months but then had a relapse. She was then switched to Avonex and 2010 until 2013 when she became pregnant. She stopped Avonex. After her pregnancy she was started on July the from April until October 2014. Patient developed emotional changes, mood lability, hair loss. Patient started on Tysabri on January 2013. Patient did well until recently. Patient moved to [facility] from [place], and has had trouble coordinating her Tysabri infusions. Her last infusion was 11/29/2013. She was  due to have infusion on 12/27/2013 but this could not be arranged due to coordination problems. In last 2 weeks having some decr sens in right eye, right eye pain, numbness in hands, word finding diff, more fatigue. Anti-JC virus antibody was negative from June 2015. Last MRI brain November 2014.   REVIEW OF SYSTEMS: Full 14 system review of systems performed and notable only for heat intolerance fatigue bowel incont daytime sleepiness decr concentration depression anxiety.     ALLERGIES: No Known Allergies  HOME MEDICATIONS: Outpatient Prescriptions Prior to Visit  Medication Sig Dispense Refill  . benzonatate (TESSALON) 200 MG capsule Take 1 capsule (200 mg total) by mouth at bedtime. Take as needed for cough 12 capsule 0  . drospirenone-ethinyl estradiol (YAZ,GIANVI,LORYNA) 3-0.02 MG tablet Take 1 tablet by mouth daily.    [name] esomeprazole (NEXIUM) 40 MG capsule Take 40 mg by mouth daily at 12 noon.    . natalizumab (TYSABRI) 300 MG/15ML injection Inject into the vein.    . promethazine (PHENERGAN) 25 MG tablet Take 1 tablet (25 mg total) by mouth every 6 (six) hours as needed for nausea or vomiting. 12 tablet 0  . rizatriptan (MAXALT) 10 MG tablet Take 10 mg by mouth as needed for migraine. May repeat in 2 hours if needed    . amphetamine-dextroamphetamine (ADDERALL XR) 20 MG 24 hr capsule Take 20 mg by mouth daily.    [name] ALPRAZolam (XANAX) 0.5 MG tablet Take 0.5 mg by mouth at bedtime as needed for anxiety.    [name] amantadine (SYMMETREL) 100 MG capsule Take 1 capsule (100 mg total) by mouth  2 (two) times daily. 60 capsule 12  . amoxicillin (AMOXIL) 875 MG tablet Take 1 tablet (875 mg total) by mouth 2 (two) times daily. (Rx void after 04/22/14) 20 tablet 0  . glycopyrrolate (ROBINUL) 1 MG tablet Take 1 mg by mouth 3 (three) times daily.    . metFORMIN (GLUMETZA) 1000 MG (MOD) 24 hr tablet Take 1,000 mg by mouth 2 (two) times daily with a meal.    . modafinil (PROVIGIL) 200 MG tablet Take 1  tablet (200 mg total) by mouth daily. 30 tablet 5  . sertraline (ZOLOFT) 100 MG tablet Take 100 mg by mouth 2 (two) times daily.      No facility-administered medications prior to visit.    PAST MEDICAL HISTORY: Past Medical History  Diagnosis Date  . Multiple sclerosis     receives Tysabri infusion q 28 days  . Diabetes type 2, controlled   . Migraine   . Anxiety   . Depression     PAST SURGICAL HISTORY: Past Surgical History  Procedure Laterality Date  . Cholecystectomy    . Cesarean section    . Abdominoplasty      FAMILY HISTORY: Family History  Problem Relation Age of Onset  . Diabetes Mother   . Hypertension Mother   . Dementia Mother   . Bipolar disorder Mother   . Heart failure Father   . Hypertension Father   . Heart disease Father   . Skin cancer Maternal Grandmother   . Alzheimer's disease Paternal Grandmother     SOCIAL HISTORY:  History   Social History  . Marital Status: Married    Spouse Name: Rollen Sox    Number of Children: 1  . Years of Education: MA   Occupational History  .  [employer]   Social History Main Topics  . Smoking status: Never Smoker   . Smokeless tobacco: Never Used  . Alcohol Use: Yes     Comment: socially; once a week  . Drug Use: No  . Sexual Activity: Not on file   Other Topics Concern  . Not on file   Social History Narrative   Patient lives at home with family.   Caffeine Use: 1-2 cups daily     PHYSICAL EXAM  Filed Vitals:   05/27/14 1537  BP: 121/74  Pulse: 95  Temp: 98.6 F (37 C)  TempSrc: Oral  Height: 6' (1.829 m)  Weight: 255 lb 6.4 oz (115.849 kg)    Not recorded      Body mass index is 34.63 kg/(m^2).  GENERAL EXAM: Patient is in no distress; well developed, nourished and groomed; neck is supple.  CARDIOVASCULAR: Regular rate and rhythm, no murmurs, no carotid bruits  NEUROLOGIC: MENTAL STATUS: awake, alert, language fluent, comprehension intact, naming intact, fund of  knowledge appropriate CRANIAL NERVE: no papilledema on fundoscopic exam, DECR LIGHT SENS IN RIGHT EYE, pupils reactive to light, visual fields full to confrontation, extraocular muscles intact, no nystagmus, facial sensation and strength symmetric, hearing intact, palate elevates symmetrically, uvula midline, shoulder shrug symmetric, tongue midline. MOTOR: normal bulk and tone, full strength in the BUE, BLE SENSORY: normal and symmetric to light touch, temperature, vibration COORDINATION: finger-nose-finger, fine finger movements normal REFLEXES: deep tendon reflexes present and symmetric GAIT/STATION: narrow based gait; able to walk tandem; romberg is negative    DIAGNOSTIC DATA (LABS, IMAGING, TESTING) - I reviewed patient records, labs, notes, testing and imaging myself where available.  Lab Results  Component Value Date   WBC  9.9 04/15/2014      Component Value Date/Time   NA 137 04/15/2014 1554   No results found for: CHOL No results found for: HGBA1C No results found for: VITAMINB12 No results found for: TSH   I reviewed images myself and agree with interpretation. -VRP  01/10/14 MRI brain (with and without) demonstrating: 1. Few small, round, ovoid, periventricular and subcortical and superior cerebellar peduncle chronic demyelinating plaques. 2. No acute plaques.  04/17/14 JCV ab - negative (0.09)   ASSESSMENT AND PLAN  34 y.o. year old female here with multiple sclerosis, on tysabri. Previously on rebif, copaxone, avonex, gilenya. Now on tysabri. Doing better now.  PLAN: - continue tysabri (q6 month MRI and JCV ab) --> next check in April 2016 - repeat labs  Orders Placed This Encounter  Procedures  . MR Brain W Wo Contrast  . CBC With differential/Platelet  . Hepatic function panel  . Ferritin  . Iron and TIBC   Return in about 3 months (around 08/26/2014).    Suanne Marker, MD 05/27/2014, 4:13 PM Certified in Neurology, Neurophysiology and  Neuroimaging  [facility] [address] [place], [place] [address] [phone]

## 2014-05-27 NOTE — Patient Instructions (Signed)
I will check labs today; MRI in April 2016; then follow up in clinic.

## 2014-05-28 LAB — CBC WITH DIFFERENTIAL
BASOS: 0 %
Basophils Absolute: 0 10*3/uL (ref 0.0–0.2)
Eos: 1 %
Eosinophils Absolute: 0.1 10*3/uL (ref 0.0–0.4)
HEMATOCRIT: 32.2 % — AB (ref 34.0–46.6)
Hemoglobin: 10.9 g/dL — ABNORMAL LOW (ref 11.1–15.9)
IMMATURE GRANS (ABS): 0 10*3/uL (ref 0.0–0.1)
Immature Granulocytes: 0 %
LYMPHS ABS: 3.8 10*3/uL — AB (ref 0.7–3.1)
LYMPHS: 40 %
MCH: 27.8 pg (ref 26.6–33.0)
MCHC: 33.9 g/dL (ref 31.5–35.7)
MCV: 82 fL (ref 79–97)
Monocytes Absolute: 0.5 10*3/uL (ref 0.1–0.9)
Monocytes: 5 %
Neutrophils Absolute: 5 10*3/uL (ref 1.4–7.0)
Neutrophils Relative %: 54 %
Platelets: 267 10*3/uL (ref 150–379)
RBC: 3.92 x10E6/uL (ref 3.77–5.28)
RDW: 15.5 % — ABNORMAL HIGH (ref 12.3–15.4)
WBC: 9.3 10*3/uL (ref 3.4–10.8)

## 2014-05-28 LAB — HEPATIC FUNCTION PANEL
ALK PHOS: 134 IU/L — AB (ref 39–117)
ALT: 19 IU/L (ref 0–32)
AST: 16 IU/L (ref 0–40)
Albumin: 4.2 g/dL (ref 3.5–5.5)
Bilirubin, Direct: 0.12 mg/dL (ref 0.00–0.40)
Total Bilirubin: 0.4 mg/dL (ref 0.0–1.2)
Total Protein: 6.8 g/dL (ref 6.0–8.5)

## 2014-05-28 LAB — IRON AND TIBC
Iron Saturation: 9 % — CL (ref 15–55)
Iron: 40 ug/dL (ref 27–159)
TIBC: 433 ug/dL (ref 250–450)
UIBC: 393 ug/dL (ref 131–425)

## 2014-05-28 LAB — FERRITIN: Ferritin: 48 ng/mL (ref 15–150)

## 2014-06-03 ENCOUNTER — Telehealth: Payer: Self-pay | Admitting: Diagnostic Neuroimaging

## 2014-06-03 ENCOUNTER — Encounter: Payer: Self-pay | Admitting: Diagnostic Neuroimaging

## 2014-06-03 NOTE — Telephone Encounter (Signed)
Pt is calling to get lab results.  Please call and advise. °

## 2014-06-04 ENCOUNTER — Telehealth: Payer: Self-pay | Admitting: *Deleted

## 2014-06-04 NOTE — Telephone Encounter (Signed)
Spoke with the patient on the phone concerning her recent lab work. She was concerned about the rare nuceleated RBC. Informed her that it was nothing to worry about that it could be related to a number of things. She stated understanding that it was ok, and said she would call back with any further questions.

## 2014-06-13 ENCOUNTER — Telehealth: Payer: Self-pay | Admitting: *Deleted

## 2014-06-13 NOTE — Telephone Encounter (Addendum)
Spoke with the pt on the phone and discussed her HGB and iron level from her recent blood work. She explained that she was on iron supplementation but was not feeling any better. She asked if Dr. Marjory Lies could potentially call in a stronger iron supplementation. She stated when her son was an infant he had to take iron drops. I told her i was not sure but I would ask Dr. Marjory Lies. She said she would finish her current bottle of iron and said she would call back if need be. Stated a thank you for the call.

## 2014-06-19 ENCOUNTER — Encounter: Payer: Self-pay | Admitting: Diagnostic Neuroimaging

## 2014-06-19 ENCOUNTER — Encounter (HOSPITAL_COMMUNITY): Payer: Self-pay

## 2014-06-19 ENCOUNTER — Encounter (HOSPITAL_COMMUNITY)
Admission: RE | Admit: 2014-06-19 | Discharge: 2014-06-19 | Disposition: A | Discharge status: Home / Self Care | Payer: BC Managed Care – PPO | Source: Ambulatory Visit | Attending: Diagnostic Neuroimaging | Admitting: Diagnostic Neuroimaging

## 2014-06-19 VITALS — BP 112/60 | HR 66 | Temp 98.2°F | Resp 18

## 2014-06-19 DIAGNOSIS — G35 Multiple sclerosis: Secondary | ICD-10-CM | POA: Diagnosis present

## 2014-06-19 MED ORDER — LORATADINE 10 MG PO TABS
10.0000 mg | ORAL_TABLET | Freq: Every day | ORAL | Status: DC
  Administered 2014-06-19: 10 mg via ORAL
  Filled 2014-06-19: qty 1

## 2014-06-19 MED ORDER — ACETAMINOPHEN 500 MG PO TABS
1000.0000 mg | ORAL_TABLET | Freq: Once | ORAL | Status: AC
  Administered 2014-06-19: 1000 mg via ORAL
  Filled 2014-06-19: qty 2

## 2014-06-19 MED ORDER — SODIUM CHLORIDE 0.9 % IV SOLN
250.0000 mL | INTRAVENOUS | Status: AC
  Administered 2014-06-19: 250 mL via INTRAVENOUS

## 2014-06-19 MED ORDER — SODIUM CHLORIDE 0.9 % IV SOLN
300.0000 mg | INTRAVENOUS | Status: DC
  Administered 2014-06-19: 300 mg via INTRAVENOUS
  Filled 2014-06-19: qty 15

## 2014-06-19 NOTE — Progress Notes (Signed)
Post-infusion Tysabri, pt. Stayed 20 minutes post-infusion, pt. To follow-up with doctor with any problems. 

## 2014-06-19 NOTE — Discharge Instructions (Signed)

## 2014-06-28 NOTE — Progress Notes (Signed)
Labs drawn for MR via right hand on third attempt; site unremarkable.  First two attempts in left outer AC space (site unremarkable) and left hand (site with small bruise).

## 2014-07-01 ENCOUNTER — Ambulatory Visit
Admission: RE | Admit: 2014-07-01 | Discharge: 2014-07-01 | Disposition: A | Discharge status: Home / Self Care | Payer: BLUE CROSS/BLUE SHIELD | Source: Ambulatory Visit | Attending: Diagnostic Neuroimaging | Admitting: Diagnostic Neuroimaging

## 2014-07-01 ENCOUNTER — Encounter: Payer: Self-pay | Admitting: Diagnostic Neuroimaging

## 2014-07-01 DIAGNOSIS — G35 Multiple sclerosis: Secondary | ICD-10-CM

## 2014-07-01 MED ORDER — GADOBENATE DIMEGLUMINE 529 MG/ML IV SOLN
20.0000 mL | Freq: Once | INTRAVENOUS | Status: AC | PRN
  Administered 2014-07-01: 20 mL via INTRAVENOUS

## 2014-07-02 ENCOUNTER — Encounter: Payer: Self-pay | Admitting: Diagnostic Neuroimaging

## 2014-07-03 NOTE — Telephone Encounter (Signed)
I called patient. MRI brain is stable (overall mild MS plaque burden). However symptoms are still significant (depression, fatigue, bowel urge/incontinence). I reassured her that the scans look stable. Tysabri seems to be working. However, her mood, cognitive and fatigue issues continue to affect her. Advised her to continue medical and psychiatry treatments, and also follow up with PCP / GI for bowel issue evaluation. If negative, then we may consider MRI c and t spine. Patient understood plan.  Suanne Marker, MD 07/03/2014, 5:29 PM Certified in Neurology, Neurophysiology and Neuroimaging  [facility] [address] [place], [place] [address] [phone]

## 2014-07-15 ENCOUNTER — Ambulatory Visit: Payer: BLUE CROSS/BLUE SHIELD | Admitting: Diagnostic Neuroimaging

## 2014-07-15 ENCOUNTER — Telehealth: Payer: Self-pay | Admitting: *Deleted

## 2014-07-15 ENCOUNTER — Encounter: Payer: Self-pay | Admitting: Diagnostic Neuroimaging

## 2014-07-15 ENCOUNTER — Other Ambulatory Visit: Payer: Self-pay | Admitting: Diagnostic Neuroimaging

## 2014-07-15 MED ORDER — AMPHETAMINE-DEXTROAMPHETAMINE 10 MG PO TABS: 10.0000 mg | ORAL_TABLET | Freq: Two times a day (BID) | ORAL | Status: DC

## 2014-07-15 NOTE — Telephone Encounter (Signed)
Pt is calling to get a written Rx for amphetamine-dextroamphetamine (ADDERALL) 10 MG tablet. Please call when ready for pick up.

## 2014-07-15 NOTE — Telephone Encounter (Signed)
Request entered, forwarded to provider for approval.  

## 2014-07-15 NOTE — Telephone Encounter (Signed)
Called and left a message asking pt to call back and let me know why she cancelled her appt today, if she wants to be seen, there are two available slots for 2 pm and 320 pm currently available. 07/15/2014 1214 pm

## 2014-07-17 ENCOUNTER — Encounter (HOSPITAL_COMMUNITY)
Admission: RE | Admit: 2014-07-17 | Discharge: 2014-07-17 | Disposition: A | Discharge status: Home / Self Care | Payer: BC Managed Care – PPO | Source: Ambulatory Visit | Attending: Diagnostic Neuroimaging | Admitting: Diagnostic Neuroimaging

## 2014-07-17 ENCOUNTER — Encounter (HOSPITAL_COMMUNITY): Payer: Self-pay

## 2014-07-17 VITALS — BP 122/71 | HR 84 | Temp 98.4°F | Resp 18 | Ht 72.0 in | Wt 245.0 lb

## 2014-07-17 DIAGNOSIS — G35 Multiple sclerosis: Secondary | ICD-10-CM | POA: Insufficient documentation

## 2014-07-17 MED ORDER — LORATADINE 10 MG PO TABS
10.0000 mg | ORAL_TABLET | Freq: Every day | ORAL | Status: DC
  Administered 2014-07-17: 10 mg via ORAL
  Filled 2014-07-17: qty 1

## 2014-07-17 MED ORDER — SODIUM CHLORIDE 0.9 % IV SOLN
250.0000 mL | INTRAVENOUS | Status: AC
  Administered 2014-07-17: 250 mL via INTRAVENOUS

## 2014-07-17 MED ORDER — ACETAMINOPHEN 500 MG PO TABS
1000.0000 mg | ORAL_TABLET | Freq: Once | ORAL | Status: AC
  Administered 2014-07-17: 1000 mg via ORAL
  Filled 2014-07-17: qty 2

## 2014-07-17 MED ORDER — SODIUM CHLORIDE 0.9 % IV SOLN
300.0000 mg | INTRAVENOUS | Status: AC
  Administered 2014-07-17: 300 mg via INTRAVENOUS
  Filled 2014-07-17: qty 15

## 2014-07-17 NOTE — Progress Notes (Signed)
Post-infusion Tysabri, pt. Stayed 10 minutes post-infusion, pt. To follow-up with MD with any problems.

## 2014-07-17 NOTE — Discharge Instructions (Signed)

## 2014-08-08 ENCOUNTER — Telehealth: Payer: Self-pay | Admitting: Diagnostic Neuroimaging

## 2014-08-08 NOTE — Telephone Encounter (Addendum)
Pt is calling stating she was returning Jessica's phone call also pt states she needs a referral to a psychiatrist.  Please call and advise.

## 2014-08-08 NOTE — Telephone Encounter (Signed)
Unfortunately, I have not called this patient.  I called back.  She said someone called her about the Tysabri infusions and co-pay.  I asked if it was [employer]/product designer) she said no, it was our office.  Also says she was calling about a referral to psych.  Will check with clinic.

## 2014-08-09 ENCOUNTER — Telehealth: Payer: Self-pay | Admitting: Diagnostic Neuroimaging

## 2014-08-09 NOTE — Telephone Encounter (Signed)
[name] at [employer] is calling regarding a prior authorization for patient for Anita Black that patient had in January. Please call. Thank you.

## 2014-08-12 ENCOUNTER — Telehealth: Payer: Self-pay | Admitting: *Deleted

## 2014-08-12 ENCOUNTER — Other Ambulatory Visit: Payer: Self-pay | Admitting: *Deleted

## 2014-08-12 DIAGNOSIS — G35 Multiple sclerosis: Secondary | ICD-10-CM

## 2014-08-12 NOTE — Telephone Encounter (Signed)
Called and spoke with the pt, informed her that Dr. Marjory Lies had placed an order for psych and that it would probably be a few months before she could get an appt. She started crying and stated that she felt like she was losing control and that "things are up and down". She said the earliest appt available was in may and she was afraid that she was going to lose her job "if i get locked up". I talked with her at length and encouraged her to seek help if she felt like she was suicidial. I told her that I would try to get information about a health services place in [place] for her and that I would call her back.   Dr. Marjory Lies was made aware of what the pt was saying and he said to encourage the pt to go to the ER if she felt that she had no support and felt like she wanted things to end. Will call pt back

## 2014-08-12 NOTE — Telephone Encounter (Signed)
Spoke to the pt on the phone and she said that [facility] referred her to a place in [facility] and she has an appt there at 10 am in the morning. She asked if the MS could be causing this episode and I explained to her that Dr. Marjory Lies stated it could be related and could potentially be a sign of a flare up. She said she would call back after the appt tomorrow and thanked me for calling to check in on her.

## 2014-08-12 NOTE — Telephone Encounter (Signed)
Located [employer] number in [place] [phone] and told the pt that she could try there. She started crying on the phone again and I asked that she seek help today if she felt that things were too far out of control. She told me that she would call them and then call me back and let me know. I thanked her

## 2014-08-14 ENCOUNTER — Encounter (HOSPITAL_COMMUNITY): Admission: RE | Admit: 2014-08-14 | Payer: BC Managed Care – PPO | Source: Ambulatory Visit

## 2014-08-27 ENCOUNTER — Encounter: Payer: Self-pay | Admitting: Diagnostic Neuroimaging

## 2014-08-27 ENCOUNTER — Ambulatory Visit (INDEPENDENT_AMBULATORY_CARE_PROVIDER_SITE_OTHER): Payer: BC Managed Care – PPO | Admitting: Diagnostic Neuroimaging

## 2014-08-27 VITALS — BP 102/70 | HR 88 | Ht 72.0 in | Wt 248.2 lb

## 2014-08-27 DIAGNOSIS — R5383 Other fatigue: Secondary | ICD-10-CM

## 2014-08-27 DIAGNOSIS — F329 Major depressive disorder, single episode, unspecified: Secondary | ICD-10-CM | POA: Diagnosis not present

## 2014-08-27 DIAGNOSIS — F32A Depression, unspecified: Secondary | ICD-10-CM

## 2014-08-27 DIAGNOSIS — G35D Multiple sclerosis, unspecified: Secondary | ICD-10-CM

## 2014-08-27 DIAGNOSIS — G35 Multiple sclerosis: Secondary | ICD-10-CM

## 2014-08-27 MED ORDER — AMPHETAMINE-DEXTROAMPHETAMINE 10 MG PO TABS: 10.0000 mg | ORAL_TABLET | Freq: Two times a day (BID) | ORAL | Status: DC

## 2014-08-27 NOTE — Patient Instructions (Signed)
Continue current medications. 

## 2014-08-27 NOTE — Progress Notes (Signed)
[facility]  PATIENT: Anita Black DOB: [birth year]  REFERRING CLINICIAN: Drucilla Chalet HISTORY FROM: patient  REASON FOR VISIT: follow up    HISTORICAL  CHIEF COMPLAINT:  Chief Complaint  Patient presents with  . Follow-up    Multiple Sclerosis     HISTORY OF PRESENT ILLNESS:   UPDATE 08/27/14: Since last visit, continues to struggle with depression, cognitive decline, fatigue. Now on oncr dose of seroquel, with some benefit. She is concerned about her job performance due to cognitive decline. She has not disclosed her MS diagnosis to her co-workers.   UPDATE 05/27/14: Since last visit, tried amantadine (no benefit after 3 weeks) and provigil (caused HA with each of 2 doses). Depression stable. Transitioning from zoloft to brintellix. Also back on adderall for cognitive and focus benefit.   UPDATE 04/15/14: Since last visit, having more fatigue, depression, difficulty in daily life. Feels more clear on tysabri cognitively. Some intermittent suicidal thoughts, but would not go through with it; does not have a plan. Planning to see a therapist soon. On zoloft  BID.   PRIOR HPI (01/08/14): 34 year old right-handed female here for evaluation of multiple sclerosis. 2008 patient developed onset of burning in her feet. She then developed numbness from her neck down to her feet just in front of her body. Patient went to a neurologist and was diagnosed with transverse myelitis, possibly clinically isolated syndrome. She had MRI, lumbar puncture, blood testing. Apparently there were 2 spots on her spinal cord but no brain lesions. She was started on Rebif, and did well for 6 months until she had a relapse. She was switched to Copaxone and did well for another 6-8 months but then had a relapse. She was then switched to Avonex and 2010 until 2013 when she became pregnant. She stopped Avonex. After her pregnancy she was started on July the from April until October 2014. Patient  developed emotional changes, mood lability, hair loss. Patient started on Tysabri on January 2013. Patient did well until recently. Patient [place] [facility] [place], and has had trouble coordinating her Tysabri infusions. Her last infusion was 11/29/2013. She was due to have infusion on 12/27/2013 but this could not be arranged due to coordination problems. In last 2 weeks having some decr sens in right eye, right eye pain, numbness in hands, word finding diff, more fatigue. Anti-JC virus antibody was negative from June 2015. Last MRI brain November 2014.   REVIEW OF SYSTEMS: Full 14 system review of systems performed and notable only for heat intolerance fatigue bowel incont daytime sleepiness decr concentration depression anxiety.    ALLERGIES: No Known Allergies  HOME MEDICATIONS: Outpatient Prescriptions Prior to Visit  Medication Sig Dispense Refill  . alprazolam (XANAX) 2 MG tablet     . benzonatate (TESSALON) 200 MG capsule Take 1 capsule (200 mg total) by mouth at bedtime. Take as needed for cough 12 capsule 0  . drospirenone-ethinyl estradiol (YAZ,GIANVI,LORYNA) 3-0.02 MG tablet Take 1 tablet by mouth daily.    [name] esomeprazole (NEXIUM) 40 MG capsule Take 40 mg by mouth daily at 12 noon.    . fluconazole (DIFLUCAN) 200 MG tablet     . glycopyrrolate (ROBINUL) 2 MG tablet     . metFORMIN (GLUCOPHAGE) 1000 MG tablet     . natalizumab (TYSABRI) 300 MG/15ML injection Inject into the vein.    . promethazine (PHENERGAN) 25 MG tablet Take 1 tablet (25 mg total) by mouth every 6 (six) hours as needed  for nausea or vomiting. 12 tablet 0  . QUEtiapine (SEROQUEL) 100 MG tablet Take 100 mg by mouth at bedtime. 50 mg in the am and 100 mg at bedtime    . rizatriptan (MAXALT) 10 MG tablet Take 10 mg by mouth as needed for migraine. May repeat in 2 hours if needed    . Vortioxetine HBr 10 MG TABS Take 1 tablet by mouth daily.    [name] amphetamine-dextroamphetamine (ADDERALL) 10 MG  tablet Take 1 tablet (10 mg total) by mouth 2 (two) times daily with a meal. 60 tablet 0   No facility-administered medications prior to visit.    PAST MEDICAL HISTORY: Past Medical History  Diagnosis Date  . Multiple sclerosis     receives Tysabri infusion q 28 days  . Diabetes type 2, controlled   . Migraine   . Anxiety   . Depression     PAST SURGICAL HISTORY: Past Surgical History  Procedure Laterality Date  . Cholecystectomy    . Cesarean section    . Abdominoplasty      FAMILY HISTORY: Family History  Problem Relation Age of Onset  . Diabetes Mother   . Hypertension Mother   . Dementia Mother   . Bipolar disorder Mother   . Heart failure Father   . Hypertension Father   . Heart disease Father   . Skin cancer Maternal Grandmother   . Alzheimer's disease Paternal Grandmother     SOCIAL HISTORY:  History   Social History  . Marital Status: Married    Spouse Name: Rollen Sox  . Number of Children: 1  . Years of Education: MA   Occupational History  .  [employer]   Social History Main Topics  . Smoking status: Never Smoker   . Smokeless tobacco: Never Used  . Alcohol Use: Yes     Comment: socially; once a week  . Drug Use: No  . Sexual Activity: Not on file   Other Topics Concern  . Not on file   Social History Narrative   Patient lives at home with family.   Caffeine Use: 1-2 cups daily     PHYSICAL EXAM  Filed Vitals:   08/27/14 1523  BP: 102/70  Pulse: 88  Height: 6' (1.829 m)  Weight: 248 lb 3.2 oz (112.583 kg)    Not recorded      Body mass index is 33.65 kg/(m^2).  GENERAL EXAM: Patient is in no distress; well developed, nourished and groomed; neck is supple.  CARDIOVASCULAR: Regular rate and rhythm, no murmurs, no carotid bruits  NEUROLOGIC: MENTAL STATUS: awake, alert, language fluent, comprehension intact, naming intact, fund of knowledge appropriate CRANIAL NERVE: no papilledema on fundoscopic exam, DECR LIGHT  SENS IN RIGHT EYE, pupils reactive to light, visual fields full to confrontation, extraocular muscles intact, no nystagmus, facial sensation and strength symmetric, hearing intact, palate elevates symmetrically, uvula midline, shoulder shrug symmetric, tongue midline. MOTOR: normal bulk and tone, full strength in the BUE, BLE SENSORY: normal and symmetric to light touch, temperature, vibration COORDINATION: finger-nose-finger, fine finger movements normal REFLEXES: deep tendon reflexes present and symmetric GAIT/STATION: narrow based gait; able to walk tandem; romberg is negative    DIAGNOSTIC DATA (LABS, IMAGING, TESTING) - I reviewed patient records, labs, notes, testing and imaging myself where available.  Lab Results  Component Value Date   WBC 9.3 05/27/2014      Component Value Date/Time   NA 137 04/15/2014 1554   No results found for: CHOL No results  found for: HGBA1C No results found for: VITAMINB12 No results found for: TSH   01/10/14 MRI brain (with and without) demonstrating: 1. Few small, round, ovoid, periventricular and subcortical and superior cerebellar peduncle chronic demyelinating plaques. 2. No acute plaques.  07/01/14 MRI brain - showing white matter foci in a pattern and configuration consistent with the diagnosis of multiple sclerosis. None of the foci appears to be acute. When compared to the prior study dated 01/10/2014, there has been no interval change.  04/17/14 JCV ab - negative (0.09)   ASSESSMENT AND PLAN  34 y.o. year old female here with multiple sclerosis, on tysabri. Previously on rebif, copaxone, avonex, gilenya. Now on tysabri. Doing better physically. However mood and cognitive decline / fatigue are still difficult.   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    Suanne Marker, MD 08/27/2014, 4:29 PM Certified in Neurology, Neurophysiology and Neuroimaging  [facility] [address] [place], [place] [address] [phone]

## 2014-09-11 ENCOUNTER — Encounter (HOSPITAL_COMMUNITY): Payer: BC Managed Care – PPO

## 2014-10-03 ENCOUNTER — Encounter: Payer: Self-pay | Admitting: Diagnostic Neuroimaging

## 2014-10-08 ENCOUNTER — Other Ambulatory Visit: Payer: Self-pay | Admitting: *Deleted

## 2014-10-08 DIAGNOSIS — G35 Multiple sclerosis: Secondary | ICD-10-CM

## 2014-10-08 MED ORDER — ADDERALL XR 10 MG PO CP24: 10.0000 mg | ORAL_CAPSULE | Freq: Two times a day (BID) | ORAL | Status: DC

## 2014-10-09 ENCOUNTER — Encounter (HOSPITAL_COMMUNITY): Payer: BC Managed Care – PPO

## 2014-11-20 ENCOUNTER — Ambulatory Visit (INDEPENDENT_AMBULATORY_CARE_PROVIDER_SITE_OTHER): Payer: BC Managed Care – PPO | Admitting: Neurology

## 2014-11-20 ENCOUNTER — Encounter: Payer: Self-pay | Admitting: Neurology

## 2014-11-20 VITALS — BP 136/82 | HR 78 | Resp 16 | Ht 72.0 in | Wt 257.8 lb

## 2014-11-20 DIAGNOSIS — R5383 Other fatigue: Secondary | ICD-10-CM | POA: Diagnosis not present

## 2014-11-20 DIAGNOSIS — F329 Major depressive disorder, single episode, unspecified: Secondary | ICD-10-CM | POA: Diagnosis not present

## 2014-11-20 DIAGNOSIS — R200 Anesthesia of skin: Secondary | ICD-10-CM

## 2014-11-20 DIAGNOSIS — G35 Multiple sclerosis: Secondary | ICD-10-CM | POA: Diagnosis not present

## 2014-11-20 DIAGNOSIS — F32A Depression, unspecified: Secondary | ICD-10-CM

## 2014-11-20 DIAGNOSIS — G43009 Migraine without aura, not intractable, without status migrainosus: Secondary | ICD-10-CM | POA: Insufficient documentation

## 2014-11-20 MED ORDER — AMPHETAMINE-DEXTROAMPHETAMINE 20 MG PO TABS: 20.0000 mg | ORAL_TABLET | Freq: Every day | ORAL | Status: DC

## 2014-11-20 MED ORDER — KETOROLAC TROMETHAMINE 30 MG/ML IJ SOLN: 30.0000 mg | Freq: Once | INTRAMUSCULAR | Status: DC

## 2014-11-20 NOTE — Progress Notes (Signed)
[facility]  PATIENT: Anita Black DOB: May 16, 1980     HISTORICAL  CHIEF COMPLAINT:  Chief Complaint  Patient presents with  . Multiple Sclerosis    Here to establish care with Dr. Epimenio Foot.  Sts. she was dx. with MS in 2009.  Presenting sx. was numbness to torse and arms.  Sts. mri brain was ok but 2 lesions were present in c-spine.  She can't remember the name of the first neurologist she saw, but sts. he started her on Rebif, which she stopped after about 6 mos. due to side effects.  She then started seeing a new neurologist in [place], [place]--Dr. Chapman Fitch was switched to Copaxone.  She stopped this after about a yr. due to inj. site fatigue.    . Fatigue    She then was switched to Avonex due to side effects, inj. site fatigue.  She then started Gilenya in May 2014--stopped this after several mos. due to decreased heart rate.  She then started Tysabri, which she remains on.  Sts. she has n/v and h/a's for several days after Tysabri infusions.  JCV ab was last checked 04-17-14 and was negative at 0.09.  Sts. the sx. that currently bother her the most are fatigue and cognitive issues, and pain bilat legs/feet./fim    HISTORY OF PRESENT ILLNESS:   Anita Black is a 34 year old woman who was diagnosed with multiple sclerosis in 2009 after presenting with numbness in the arms and torso, primarily in the front of her body. An MRI of the brain was normal but an MRI of the spinal cord showed 2 cervical plaques. She was started on Rebif but had difficulty tolerating the medication and after about a year switched to Copaxone. Unfortunately, she also had trouble tolerating Copaxone. She then was on Copaxone and had injeciotn reactions and switched to Avonex.  After her pregnanacy, she was switched to Gilenya. However, she had bradycardia and that was discontinued. In January 2015 she started Tysabri and continues on that medication. [place].  She continues her Tysabri treatments here.     She is JCV antibody negative. She feels better with the Tysabri infusions in general. However, she gets nausea that lasts several days after each infusion.  Gait/strength/sensation: She has noticed difficulty with her gait, predominantly problems with balance. Additionally she falls sometimes due to jolting pain. When she was pregnant she would have pain that would be sudden and intense in the right side and would usually lead to her falling if she was standing up. That improved but over the past month she has been getting similar sensations that are in the lower laterally. She is getting spasms of pain that are deep. When they occur she sometimes falls. In between the episodes, she does not notice significant numbness or weakness. She will get some tingling when she is hot or tired.  Bladder/bowel: She denies any bladder dysfunction. She does note bowel frequency and some incontinence that usually occurs within the first few days after each Tysabri infusion. This has only occurred over the last 4 Tysabri infusions.  Vision: In the past, she has had optic neuritis involving each eye. Vision returned towards normal. She has not had diplopia.  Fatigue/sleep: She has fatigue is both physical and cognitive. This occurs daily but may worsen as the day goes on. She does note that she sleeps well on most every night. She snores but has never been noted to have pauses in  her breathing or gasping.  Mood/cognition: She had a major depressive episode earlier this year and should've sore psychiatry. She was placed on Brintillex area and she also has been on Xanax 2 mg twice most days for a while. Cognition has been a problem for her also. She notes difficulty with focus as well as with verbal fluency. She feels a lot of her cognitive issues are due to her fatigue.  Migraine headaches: She has a long history of migraine headaches since she was in third grade. She does  not get aura. She is having about 6 or 8 of them a month and at least half of them she ends up going to urgent care, usually getting a shot of Toradol. Triptan's help her a little bit but in a variable way she also uses Norco as an agent if the headache returns after her Toradol shots.  ROS:  Out of a complete 14 system review of symptoms, the patient complains only of the following symptoms, and all other reviewed systems are negative.   she notes fatigue ,  Headaches, difficulty with attention. Snores, bowel incontinence   ALLERGIES: No Known Allergies  HOME MEDICATIONS:  Current outpatient prescriptions:  .  alprazolam (XANAX) 2 MG tablet, , Disp: , Rfl:  .  drospirenone-ethinyl estradiol (YAZ,GIANVI,LORYNA) 3-0.02 MG tablet, Take 1 tablet by mouth daily., Disp: , Rfl:  .  glycopyrrolate (ROBINUL) 2 MG tablet, , Disp: , Rfl:  .  HYDROcodone-acetaminophen (NORCO/VICODIN) 5-325 MG per tablet, , Disp: , Rfl:  .  metFORMIN (GLUCOPHAGE) 1000 MG tablet, , Disp: , Rfl:  .  natalizumab (TYSABRI) 300 MG/15ML injection, Inject into the vein., Disp: , Rfl:  .  promethazine (PHENERGAN) 25 MG tablet, Take 1 tablet (25 mg total) by mouth every 6 (six) hours as needed for nausea or vomiting., Disp: 12 tablet, Rfl: 0 .  rizatriptan (MAXALT) 10 MG tablet, Take 10 mg by mouth as needed for migraine. May repeat in 2 hours if needed, Disp: , Rfl:  .  Vortioxetine HBr 10 MG TABS, Take 1 tablet by mouth daily., Disp: , Rfl:  .  ADDERALL XR 10 MG 24 hr capsule, Take 1 capsule (10 mg total) by mouth 2 (two) times daily. (Patient not taking: Reported on 11/20/2014), Disp: 60 capsule, Rfl: 0 .  benzonatate (TESSALON) 200 MG capsule, Take 1 capsule (200 mg total) by mouth at bedtime. Take as needed for cough (Patient not taking: Reported on 11/20/2014), Disp: 12 capsule, Rfl: 0 .  esomeprazole (NEXIUM) 40 MG capsule, Take 40 mg by mouth daily at 12 noon., Disp: , Rfl:  .  fluconazole (DIFLUCAN) 200 MG tablet, ,  Disp: , Rfl:  .  QUEtiapine (SEROQUEL) 100 MG tablet, Take 100 mg by mouth at bedtime. 50 mg in the am and 100 mg at bedtime, Disp: , Rfl:  .  rizatriptan (MAXALT-MLT) 10 MG disintegrating tablet, , Disp: , Rfl:   PAST MEDICAL HISTORY: Past Medical History  Diagnosis Date  . Multiple sclerosis     receives Tysabri infusion q 28 days  . Diabetes type 2, controlled   . Migraine   . Anxiety   . Depression     PAST SURGICAL HISTORY: Past Surgical History  Procedure Laterality Date  . Cholecystectomy    . Cesarean section    . Abdominoplasty      FAMILY HISTORY: Family History  Problem Relation Age of Onset  . Diabetes Mother   . Hypertension Mother   . Dementia Mother   .  Bipolar disorder Mother   . Heart failure Father   . Hypertension Father   . Heart disease Father   . Skin cancer Maternal Grandmother   . Alzheimer's disease Paternal Grandmother     SOCIAL HISTORY:  History   Social History  . Marital Status: Married    Spouse Name: Rollen Sox  . Number of Children: 1  . Years of Education: MA   Occupational History  .  [employer]   Social History Main Topics  . Smoking status: Never Smoker   . Smokeless tobacco: Never Used  . Alcohol Use: Yes     Comment: socially; once a week  . Drug Use: No  . Sexual Activity: Not on file   Other Topics Concern  . Not on file   Social History Narrative   Patient lives at home with family.   Caffeine Use: 1-2 cups daily     PHYSICAL EXAM  Filed Vitals:   11/20/14 1145  BP: 136/82  Pulse: 78  Resp: 16  Height: 6' (1.829 m)  Weight: 257 lb 12.8 oz (116.937 kg)    Body mass index is 34.96 kg/(m^2).   General: The patient is well-developed and well-nourished and in no acute distress  Eyes:  Funduscopic exam shows normal optic discs and retinal vessels.  Neck: The neck is supple, no carotid bruits are noted.  The neck is nontender.  Skin: Extremities are without significant  edema.  Musculoskeletal:  Back is nontender  Neurologic Exam  Mental status: The patient is alert and oriented x 3 at the time of the examination. The patient has apparent normal recent and remote memory, with an apparently normal attention span and concentration ability.   Speech is normal.  Cranial nerves: Extraocular movements are full. Pupils are equal, round, and reactive to light and accomodation.  Visual fields are full.  Facial symmetry is present. There is good facial sensation to soft touch bilaterally.Facial strength is normal.  Trapezius and sternocleidomastoid strength is normal. No dysarthria is noted.  The tongue is midline, and the patient has symmetric elevation of the soft palate. No obvious hearing deficits are noted.  Motor:  Muscle bulk is normal.   Tone is normal. Strength is  5 / 5 in all 4 extremities.   Sensory: Sensory testing is intact to pinprick, soft touch and vibration sensation in all 4 extremities.  Coordination: Cerebellar testing reveals good finger-nose-finger and heel-to-shin bilaterally.  Gait and station: Station is normal.   Gait is mildly wide. Tandem gait is wide. Romberg is negative.   Reflexes: Deep tendon reflexes are symmetric and 1+ bilaterally.     DIAGNOSTIC DATA (LABS, IMAGING, TESTING) - I reviewed patient records, labs, notes, testing and imaging myself where available.  Lab Results  Component Value Date   WBC 9.3 05/27/2014   HGB 10.9* 05/27/2014   HCT 32.2* 05/27/2014   MCV 82 05/27/2014   PLT 267 05/27/2014      Component Value Date/Time   NA 137 04/15/2014 1554   K 4.2 04/15/2014 1554   CL 102 04/15/2014 1554   CO2 20 04/15/2014 1554   GLUCOSE 94 04/15/2014 1554   BUN 15 04/15/2014 1554   CREATININE 0.77 04/15/2014 1554   CALCIUM 9.0 04/15/2014 1554   PROT 6.8 05/27/2014 1624   AST 16 05/27/2014 1624   ALT 19 05/27/2014 1624   ALKPHOS 134* 05/27/2014 1624   BILITOT 0.4 05/27/2014 1624   GFRNONAA 102 04/15/2014  1554   GFRAA 117 04/15/2014  1554       ASSESSMENT AND PLAN  MS (multiple sclerosis) - Plan: Stratify JCV Antibody Test ([facility]), CBC with Differential/Platelet  Other fatigue  Depression  Numbness  Common migraine without intractability   1.   She will continue on Tysabri for her multiple sclerosis. She should continue to take Phenergan day of infusion and for the next couple of days. We discussed the possibility of switching medications but for now she will continue on Tysabri. I will recheck a JCV antibody. 2.   Her migraine headaches are poorly controlled. She often gets Toradol injections at urgent care. I will prescribe Toradol IM so that she may give herself injections and save the multiple trips to urgent care. 3.   She did better on a higher dose of Adderall in the past and I will prescribe this for her. 4.  She will continue to see psychiatry for her depression. 5.   Return to clinic in 4 months for a regular follow-up visit or call sooner if new or worsening neurologic symptoms.  [name]. Epimenio Foot, MD, PhD 11/20/2014, 12:43 PM Certified in Neurology, Clinical Neurophysiology, Sleep Medicine, Pain Medicine and Neuroimaging  [facility] [address] [place], [place] [address] [phone]

## 2014-11-21 LAB — CBC WITH DIFFERENTIAL/PLATELET
Basophils Absolute: 0 10*3/uL (ref 0.0–0.2)
Basos: 0 %
EOS (ABSOLUTE): 0.1 10*3/uL (ref 0.0–0.4)
EOS: 1 %
HEMATOCRIT: 33.7 % — AB (ref 34.0–46.6)
Hemoglobin: 11.3 g/dL (ref 11.1–15.9)
Immature Grans (Abs): 0 10*3/uL (ref 0.0–0.1)
Immature Granulocytes: 0 %
LYMPHS ABS: 3 10*3/uL (ref 0.7–3.1)
LYMPHS: 43 %
MCH: 26.9 pg (ref 26.6–33.0)
MCHC: 33.5 g/dL (ref 31.5–35.7)
MCV: 80 fL (ref 79–97)
MONOCYTES: 5 %
Monocytes Absolute: 0.4 10*3/uL (ref 0.1–0.9)
Neutrophils Absolute: 3.6 10*3/uL (ref 1.4–7.0)
Neutrophils: 51 %
PLATELETS: 288 10*3/uL (ref 150–379)
RBC: 4.2 x10E6/uL (ref 3.77–5.28)
RDW: 14.4 % (ref 12.3–15.4)
WBC: 7.2 10*3/uL (ref 3.4–10.8)

## 2014-11-26 ENCOUNTER — Ambulatory Visit: Payer: BC Managed Care – PPO | Admitting: Diagnostic Neuroimaging

## 2014-11-26 ENCOUNTER — Ambulatory Visit: Payer: BC Managed Care – PPO | Admitting: Neurology

## 2014-11-29 ENCOUNTER — Encounter: Payer: Self-pay | Admitting: *Deleted

## 2015-01-03 ENCOUNTER — Other Ambulatory Visit: Payer: Self-pay | Admitting: *Deleted

## 2015-01-03 MED ORDER — AMPHETAMINE-DEXTROAMPHETAMINE 20 MG PO TABS: 20.0000 mg | ORAL_TABLET | Freq: Every day | ORAL | Status: DC

## 2015-01-03 NOTE — Telephone Encounter (Signed)
Adderall rx. provided at infusion appt. this am/fim

## 2015-01-14 ENCOUNTER — Encounter: Payer: Self-pay | Admitting: Neurology

## 2015-01-21 ENCOUNTER — Ambulatory Visit: Payer: Self-pay | Admitting: Neurology

## 2015-01-21 ENCOUNTER — Encounter: Payer: Self-pay | Admitting: Neurology

## 2015-01-22 ENCOUNTER — Encounter: Payer: Self-pay | Admitting: Neurology

## 2015-01-22 ENCOUNTER — Ambulatory Visit (INDEPENDENT_AMBULATORY_CARE_PROVIDER_SITE_OTHER): Payer: BC Managed Care – PPO | Admitting: Neurology

## 2015-01-22 VITALS — BP 116/84 | HR 74 | Resp 18 | Ht 72.0 in | Wt 256.2 lb

## 2015-01-22 DIAGNOSIS — F329 Major depressive disorder, single episode, unspecified: Secondary | ICD-10-CM

## 2015-01-22 DIAGNOSIS — R200 Anesthesia of skin: Secondary | ICD-10-CM

## 2015-01-22 DIAGNOSIS — R5383 Other fatigue: Secondary | ICD-10-CM

## 2015-01-22 DIAGNOSIS — F32A Depression, unspecified: Secondary | ICD-10-CM

## 2015-01-22 DIAGNOSIS — G35D Multiple sclerosis, unspecified: Secondary | ICD-10-CM

## 2015-01-22 DIAGNOSIS — G35 Multiple sclerosis: Secondary | ICD-10-CM | POA: Diagnosis not present

## 2015-01-22 DIAGNOSIS — G43009 Migraine without aura, not intractable, without status migrainosus: Secondary | ICD-10-CM | POA: Diagnosis not present

## 2015-01-22 MED ORDER — ARIPIPRAZOLE 5 MG PO TABS
5.0000 mg | ORAL_TABLET | Freq: Every day | ORAL | Status: DC
Start: 2015-01-22 — End: 2015-06-11

## 2015-01-22 MED ORDER — AMPHETAMINE-DEXTROAMPHETAMINE 20 MG PO TABS: 20.0000 mg | ORAL_TABLET | Freq: Two times a day (BID) | ORAL | Status: DC

## 2015-01-22 NOTE — Progress Notes (Signed)
[facility]  PATIENT: Anita Black DOB: [birth year]     HISTORICAL  CHIEF COMPLAINT:  Chief Complaint  Patient presents with  . Multiple Sclerosis    She is here today with c/o worsening depression over the last 3 weeks.  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  She has seen a psychiatrist in the past--Dr. Cottle./fim    HISTORY OF PRESENT ILLNESS:   Anita Black is a 34 year old woman with MS.     Mood:   She is noting much more depression.   She had a major depression in the spring and was placed on Brintillex.   She had more symptoms in July and dose was increased to 20 mg.   Mood worsened 3-4 weeks ago and she feels she is on a roller coaster with a lot of fluctuations throughout the day with her mood.   She denies manic/hypomanic symptoms.   She is having more trouble handling stress.  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   She found regular  Gait/strength/sensation: She has difficulty with her balance and gait.   She is having more pain in both legs from the hips and down.   This pain started earlier in the year below the knees but is worse now and covers a larger area.  Pain seems in the bones.    Bladder/bowel: She denies any bladder dysfunction.   She has bowel incontinence 1-2 times monthly  She can't get to the bathroom in times when it occurs.  Vision: In the past, she has had optic neuritis involving each eye. Vision returned towards normal. She has not had diplopia.  Fatigue/sleep: Fatigue is worse.    She falls asleep when she gets home.   She wakes up more at night.   She does note that she sleeps well on most every night. Adderall helped her sleepiness.   She snores but has never been noted to have pauses in her breathing or gasping.  Cognition: Cognition has been a problem for her but is similar to earlier in year.    Focus is reduced.  . She notes difficulty with focus as well as with verbal fluency.   Migraine headaches: Self injected toradol has helped for the 3-4 migraines a month and she has not needed ER/UC visits.     MS History:  She was diagnosed with multiple sclerosis in 2009 after presenting with numbness in the arms and torso, primarily in the front of her body. An MRI of the brain was normal but an MRI of the spinal cord showed 2 cervical plaques. She was started on Rebif but had difficulty tolerating the medication and after about a year switched to Copaxone. Unfortunately, she also had trouble tolerating Copaxone. She then was on Copaxone and had injeciotn reactions and switched to Avonex.  After her pregnanacy, she was switched to Gilenya. However, she had bradycardia and that was discontinued. In January 2015 she started Tysabri and continues on that medication. [place]. She continues her Tysabri treatments here.     She is JCV antibody negative. She feels better with the Tysabri infusions in general. However, she gets nausea that lasts several days after each infusion.   I personally reviewed her MRI from 07/01/14 showing about 10-12 hyperintense foci consistent with MS. No lesions were seen  in the posterior fossa.    ROS:  Out of a complete 14 system review of symptoms, the patient complains only of the following symptoms, and all other reviewed systems are negative.   she notes fatigue ,  Headaches, difficulty with attention. Snores, bowel incontinence   ALLERGIES: No Known Allergies  HOME MEDICATIONS:  Current outpatient prescriptions:  .  alprazolam (XANAX) 2 MG tablet, , Disp: , Rfl:  .  amphetamine-dextroamphetamine (ADDERALL) 20 MG tablet, Take 1 tablet (20 mg total) by mouth daily., Disp: 60 tablet, Rfl: 0 .  benzonatate (TESSALON) 200 MG capsule, Take 1 capsule (200 mg total) by mouth at bedtime. Take as needed for cough, Disp: 12 capsule, Rfl: 0 .   drospirenone-ethinyl estradiol (YAZ,GIANVI,LORYNA) 3-0.02 MG tablet, Take 1 tablet by mouth daily., Disp: , Rfl:  .  esomeprazole (NEXIUM) 40 MG capsule, Take 40 mg by mouth daily at 12 noon., Disp: , Rfl:  .  fluconazole (DIFLUCAN) 200 MG tablet, , Disp: , Rfl:  .  glycopyrrolate (ROBINUL) 2 MG tablet, , Disp: , Rfl:  .  HYDROcodone-acetaminophen (NORCO/VICODIN) 5-325 MG per tablet, , Disp: , Rfl:  .  ketorolac (TORADOL) 30 MG/ML injection, Inject 1 mL (30 mg total) into the vein once., Disp: 8 mL, Rfl: 5 .  metFORMIN (GLUCOPHAGE) 1000 MG tablet, , Disp: , Rfl:  .  natalizumab (TYSABRI) 300 MG/15ML injection, Inject into the vein., Disp: , Rfl:  .  promethazine (PHENERGAN) 25 MG tablet, Take 1 tablet (25 mg total) by mouth every 6 (six) hours as needed for nausea or vomiting., Disp: 12 tablet, Rfl: 0 .  rizatriptan (MAXALT) 10 MG tablet, Take 10 mg by mouth as needed for migraine. May repeat in 2 hours if needed, Disp: , Rfl:  .  rizatriptan (MAXALT-MLT) 10 MG disintegrating tablet, , Disp: , Rfl:  .  Vortioxetine HBr 10 MG TABS, Take 1 tablet by mouth daily., Disp: , Rfl:   PAST MEDICAL HISTORY: Past Medical History  Diagnosis Date  . Multiple sclerosis     receives Tysabri infusion q 28 days  . Diabetes type 2, controlled   . Migraine   . Anxiety   . Depression     PAST SURGICAL HISTORY: Past Surgical History  Procedure Laterality Date  . Cholecystectomy    . Cesarean section    . Abdominoplasty      FAMILY HISTORY: Family History  Problem Relation Age of Onset  . Diabetes Mother   . Hypertension Mother   . Dementia Mother   . Bipolar disorder Mother   . Heart failure Father   . Hypertension Father   . Heart disease Father   . Skin cancer Maternal Grandmother   . Alzheimer's disease Paternal Grandmother     SOCIAL HISTORY:  Social History   Social History  . Marital Status: Married    Spouse Name: Rollen Sox  . Number of Children: 1  . Years of Education: MA    Occupational History  .  [employer]   Social History Main Topics  . Smoking status: Never Smoker   . Smokeless tobacco: Never Used  . Alcohol Use: Yes     Comment: socially; once a week  . Drug Use: No  . Sexual Activity: Not on file   Other Topics Concern  . Not on file   Social History Narrative   Patient lives at home with family.   Caffeine Use: 1-2 cups daily     PHYSICAL EXAM  Filed Vitals:  01/22/15 1311  BP: 116/84  Pulse: 74  Resp: 18  Height: 6' (1.829 m)  Weight: 256 lb 3.2 oz (116.212 kg)    Body mass index is 34.74 kg/(m^2).   General: The patient is well-developed and well-nourished and in no acute distress   Neurologic Exam  Mental status: The patient is alert and oriented x 3 at the time of the examination. The patient has apparent normal recent and remote memory, with an apparently normal attention span and concentration ability.   Speech is normal.  Cranial nerves: Extraocular movements are full.  Facial symmetry is present. There is good facial sensation to soft touch bilaterally.Facial strength is normal.  Trapezius and sternocleidomastoid strength is normal. No dysarthria is noted. No obvious hearing deficits are noted.  Motor:  Muscle bulk is normal.   Tone is normal. Strength is  5 / 5 in all 4 extremities.   Sensory: Sensory testing is intact to pinprick, soft touch and vibration sensation in all 4 extremities.  Coordination: Cerebellar testing reveals good finger-nose-finger and heel-to-shin bilaterally.  Gait and station: Station is normal.   Gait is mildly wide. Tandem gait is wide. Romberg is negative.   Reflexes: Deep tendon reflexes are symmetric and 1+ bilaterally.     DIAGNOSTIC DATA (LABS, IMAGING, TESTING) - I reviewed patient records, labs, notes, testing and imaging myself where available.  Lab Results  Component Value Date   WBC 7.2 11/20/2014   HGB 10.9* 05/27/2014   HCT 33.7* 11/20/2014   MCV 82 05/27/2014    PLT 267 05/27/2014      Component Value Date/Time   NA 137 04/15/2014 1554   K 4.2 04/15/2014 1554   CL 102 04/15/2014 1554   CO2 20 04/15/2014 1554   GLUCOSE 94 04/15/2014 1554   BUN 15 04/15/2014 1554   CREATININE 0.77 04/15/2014 1554   CALCIUM 9.0 04/15/2014 1554   PROT 6.8 05/27/2014 1624   AST 16 05/27/2014 1624   ALT 19 05/27/2014 1624   ALKPHOS 134* 05/27/2014 1624   BILITOT 0.4 05/27/2014 1624   GFRNONAA 102 04/15/2014 1554   GFRAA 117 04/15/2014 1554       ASSESSMENT AND PLAN  Multiple sclerosis - Plan: MR Cervical Spine W Wo Contrast  MS (multiple sclerosis)  DS (disseminated sclerosis)  Common migraine without intractability  Other fatigue  Numbness  Depression    1.   She will continue on Tysabri for her multiple sclerosis. She should continue to take Phenergan day of infusion and for the next couple of days.  2.   Add [name] 5 mg. If not better soon, consider also addition of lamotrigine (titrate to 100 mg by mouth twice a day) or change antidepressants to Cymbalta  3.   Renew Adderall  4.  She will continue to see psychiatry for her depression.  I advised her to make an appointment to get in soon. 5.   Return to clinic in 4 months for a regular follow-up visit or call sooner if new or worsening neurologic symptoms.  45 minute face-to-face evaluation with greater than one half of the time counseling and coordinating care about her psychologic issues and their relation to work and her MS.  Jasman Murri A. Epimenio Foot, MD, PhD 01/22/2015, 1:21 PM Certified in Neurology, Clinical Neurophysiology, Sleep Medicine, Pain Medicine and Neuroimaging  [facility] [address], [address] [place], [place] [address] [phone]

## 2015-01-27 ENCOUNTER — Ambulatory Visit
Admission: RE | Admit: 2015-01-27 | Discharge: 2015-01-27 | Disposition: A | Discharge status: Home / Self Care | Payer: BC Managed Care – PPO | Source: Ambulatory Visit | Attending: Neurology | Admitting: Neurology

## 2015-01-27 ENCOUNTER — Other Ambulatory Visit: Payer: Self-pay | Admitting: Neurology

## 2015-01-27 DIAGNOSIS — G35 Multiple sclerosis: Secondary | ICD-10-CM | POA: Diagnosis not present

## 2015-01-27 DIAGNOSIS — G35D Multiple sclerosis, unspecified: Secondary | ICD-10-CM

## 2015-01-27 MED ORDER — GADOBENATE DIMEGLUMINE 529 MG/ML IV SOLN: 20.0000 mL | Freq: Once | INTRAVENOUS | Status: DC | PRN

## 2015-01-28 ENCOUNTER — Telehealth: Payer: Self-pay | Admitting: *Deleted

## 2015-01-28 NOTE — Telephone Encounter (Signed)
-----   Message from Asa Lente, MD sent at 01/27/2015  5:31 PM EDT ----- MRI of the cervical spine showed 3 or 4 spots in the spinal cord.  None of them looked new.   She should continue Tysabri.

## 2015-01-28 NOTE — Telephone Encounter (Signed)
I have spoken with [facility] and per RAS, advised that mri of the neck showed 3-4 spots but none looked new; she should continue Tysabri.  She verbalized understanding of same/fim

## 2015-01-29 ENCOUNTER — Ambulatory Visit: Payer: Self-pay | Admitting: Neurology

## 2015-02-04 ENCOUNTER — Ambulatory Visit: Payer: Self-pay | Admitting: Neurology

## 2015-02-09 ENCOUNTER — Encounter: Payer: Self-pay | Admitting: Neurology

## 2015-02-11 ENCOUNTER — Ambulatory Visit (INDEPENDENT_AMBULATORY_CARE_PROVIDER_SITE_OTHER): Payer: BC Managed Care – PPO | Admitting: Family Medicine

## 2015-02-11 ENCOUNTER — Encounter: Payer: Self-pay | Admitting: Family Medicine

## 2015-02-11 VITALS — BP 109/75 | HR 78 | Temp 97.9°F | Resp 16 | Wt 254.0 lb

## 2015-02-11 DIAGNOSIS — J0100 Acute maxillary sinusitis, unspecified: Secondary | ICD-10-CM

## 2015-02-11 DIAGNOSIS — J019 Acute sinusitis, unspecified: Secondary | ICD-10-CM | POA: Insufficient documentation

## 2015-02-11 MED ORDER — BENZONATATE 200 MG PO CAPS: 200.0000 mg | ORAL_CAPSULE | Freq: Two times a day (BID) | ORAL | Status: DC | PRN

## 2015-02-11 MED ORDER — DOXYCYCLINE HYCLATE 100 MG PO TABS: 100.0000 mg | ORAL_TABLET | Freq: Two times a day (BID) | ORAL | Status: DC

## 2015-02-11 MED ORDER — FLUTICASONE PROPIONATE 50 MCG/ACT NA SUSP: 2.0000 | Freq: Every day | NASAL | Status: DC

## 2015-02-11 MED ORDER — HYDROCODONE-HOMATROPINE 5-1.5 MG/5ML PO SYRP: 5.0000 mL | ORAL_SOLUTION | Freq: Three times a day (TID) | ORAL | Status: DC | PRN

## 2015-02-11 NOTE — Progress Notes (Signed)
Subjective:    Patient ID: Anita Black, female    DOB: [birth year], 34 y.o.   MRN: [id number]  HPI Patient presents for new patient establishment, with an acute illness. All past medical history, surgical history, allergies, family history, medications, immunizations and social history was obtained from the patient today and entered into the electronic medical record. Records are requested from her prior PCP, and will be reviewed at the time they are received. All medical records will be updated at that time.  Patient moved from [place], [place] approximately one year ago. She has been maintaining her primary care provider there, but feels she now needs to establish with a primary care provider in the area. Patient is established with neurology for her MS, and follow closely with Dr. Marjory Lies.   Cough: Patient presents with acute visit for greater than 5 day history of cough, chest burning with cough, mild sore throat, itchy ears. Patient states she is having very harsh coughs to the point of spasms. She reports they are mildly productive now. She denies nausea, vomiting, diarrhea, fevers, chills or rash. He states she had leftover Augmentin and started taking that 2 times a day on Sunday, but does not feel that it is working. She has tried Advil, Mucinex DM, Tessalon Perles and Robitussin to help with her symptoms, but she feels she is getting worse. Patient is not on any histamines or nasal sprays. Patient is being treated for her multiple sclerosis with immune modulators. She has no known allergies.  Past Medical History  Diagnosis Date  . Multiple sclerosis (HCC)     receives Tysabri infusion q 28 days  . Diabetes type 2, controlled (HCC)   . Migraine   . Anxiety   . Depression    No Known Allergies Past Surgical History  Procedure Laterality Date  . Cholecystectomy    . Cesarean section    . Abdominoplasty     Family History  Problem Relation Age of Onset  .  Diabetes Mother   . Hypertension Mother   . Dementia Mother   . Bipolar disorder Mother   . Heart failure Father   . Hypertension Father   . Heart disease Father   . Ovarian cancer Maternal Grandmother   . Alzheimer's disease Paternal Grandmother   . Skin cancer Maternal Grandfather    Social History   Social History  . Marital Status: Married    Spouse Name: Rollen Sox  . Number of Children: 1  . Years of Education: MA   Occupational History  .  [employer]   Social History Main Topics  . Smoking status: Never Smoker   . Smokeless tobacco: Never Used  . Alcohol Use: Yes     Comment: socially; once a week  . Drug Use: No  . Sexual Activity: Not on file   Other Topics Concern  . Not on file   Social History Narrative   Patient lives at home with family.   Caffeine Use: 1-2 cups daily     Medication List       This list is accurate as of: 02/11/15  4:44 PM.  Always use your most recent med list.               alprazolam 2 MG tablet  Commonly known as:  XANAX  Take 2 mg by mouth 3 (three) times daily as needed.     amphetamine-dextroamphetamine 20 MG tablet  Commonly known as:  ADDERALL  Take 1 tablet (  20 mg total) by mouth 2 (two) times daily.     ARIPiprazole 5 MG tablet  Commonly known as:  [name]  Take 1 tablet (5 mg total) by mouth daily.     benzonatate 200 MG capsule  Commonly known as:  TESSALON  Take 1 capsule (200 mg total) by mouth 2 (two) times daily as needed for cough.     doxycycline 100 MG tablet  Commonly known as:  VIBRA-TABS  Take 1 tablet (100 mg total) by mouth 2 (two) times daily.     fluticasone 50 MCG/ACT nasal spray  Commonly known as:  FLONASE  Place 2 sprays into both nostrils daily.     glycopyrrolate 2 MG tablet  Commonly known as:  ROBINUL  Take 2 mg by mouth 2 (two) times daily.     HYDROcodone-acetaminophen 5-325 MG tablet  Commonly known as:  NORCO/VICODIN  Take 1 tablet by mouth every 6 (six) hours as  needed.     HYDROcodone-homatropine 5-1.5 MG/5ML syrup  Commonly known as:  HYCODAN  Take 5 mLs by mouth every 8 (eight) hours as needed for cough.     ketorolac 30 MG/ML injection  Commonly known as:  TORADOL  Inject 1 mL (30 mg total) into the vein once.     metFORMIN 1000 MG tablet  Commonly known as:  GLUCOPHAGE  Take 1,000 mg by mouth 2 (two) times daily with a meal.     natalizumab 300 MG/15ML injection  Commonly known as:  TYSABRI  Inject into the vein. Every 28 days     promethazine 25 MG tablet  Commonly known as:  PHENERGAN  Take 1 tablet (25 mg total) by mouth every 6 (six) hours as needed for nausea or vomiting.     rizatriptan 10 MG disintegrating tablet  Commonly known as:  MAXALT-MLT  Place 10 mg under the tongue as needed.     SPRINTEC 28 0.25-35 MG-MCG tablet  Generic drug:  norgestimate-ethinyl estradiol  Take 1 tablet by mouth daily.     TRINTELLIX 20 MG Tabs  Generic drug:  Vortioxetine HBr  Take 1 tablet by mouth daily.     zolmitriptan 5 MG disintegrating tablet  Commonly known as:  ZOMIG-ZMT  Take 1 tablet by mouth as needed.        Review of Systems Negative, with the exception of above mentioned in HPI    Objective:   Physical Exam BP 109/75 mmHg  Pulse 78  Temp(Src) 97.9 F (36.6 C) (Oral)  Resp 16  Wt 254 lb (115.214 kg)  SpO2 97%  LMP 01/23/2015  Body mass index is 33.52 kg/(m^2). Gen: Afebrile. No acute distress. Nontoxic in appearance, well-developed, well-nourished, Caucasian female. Appears fatigued. HENT: AT. [place]. Bilateral TM visualized with no erythema or bulging membrane, tympanic membranes shiny/full. MMM. Bilateral nares with erythema and swelling. Throat without erythema or exudates. Hoarseness present on exam, cough present on exam. Tender to palpation maxillary sinus bilaterally. Eyes:Pupils Equal Round Reactive to light, Extraocular movements intact,  Conjunctiva without redness, discharge or  icterus. Neck/lymp/endocrine: Supple, no lymphadenopathy CV: RRR  Chest: CTAB, no wheeze or crackles Abd: Soft. Obese. NTND. BS present. No Masses palpated.  Skin: No rashes, purpura or petechiae.  Neuro: Normal gait. PERLA. EOMi. Alert. Oriented x3.   Assessment & Plan:  1. Acute maxillary sinusitis, recurrence not specified - Patient on immune modulator for her MS. Potential to be more susceptible  to respiratory infections. Will call her with doxycycline twice a day  10 days. Flonase, Tessalon Perles for cough, Hycodan cough syrup for nighttime only. Patient was encouraged to get plenty of rest, maintain hydration, use nasal saline 2-3 times a day. She can try adding some Mucinex if she finds her secretions are thickened. - doxycycline (VIBRA-TABS) 100 MG tablet; Take 1 tablet (100 mg total) by mouth 2 (two) times daily.  Dispense: 20 tablet; Refill: 0 - fluticasone (FLONASE) 50 MCG/ACT nasal spray; Place 2 sprays into both nostrils daily.  Dispense: 16 g; Refill: 6 - benzonatate (TESSALON) 200 MG capsule; Take 1 capsule (200 mg total) by mouth 2 (two) times daily as needed for cough.  Dispense: 20 capsule; Refill: 0 - HYDROcodone-homatropine (HYCODAN) 5-1.5 MG/5ML syrup; Take 5 mLs by mouth every 8 (eight) hours as needed for cough.  Dispense: 120 mL; Refill: 0  Follow-up if no improvement in one week, patient will need to establish and have complete physical scheduled within 2 months. Records have been requested from her prior PCP and [place]

## 2015-02-11 NOTE — Progress Notes (Signed)
Pre visit review using our clinic review tool, if applicable. No additional management support is needed unless otherwise documented below in the visit note. 

## 2015-02-11 NOTE — Patient Instructions (Signed)

## 2015-02-21 ENCOUNTER — Telehealth: Payer: Self-pay | Admitting: Neurology

## 2015-02-21 NOTE — Telephone Encounter (Signed)
LMOM for Anita Black that Shanda Bumps has no info on pa either.  I am afraid that this is a pt. who was infused and it wasn't realized that she needed a pa, as this was not her first infusion--the March infusion lines up with when we were getting our infusion suite established here at [facility], but some pt's had to be infused at [facility] in the interim./fim

## 2015-02-21 NOTE — Telephone Encounter (Signed)
Anita Black with [facility] out pt regarding prior auth approval for Tysabri on 07/17/14. Pt has BC/BS. Please call and advise at [phone]

## 2015-02-24 ENCOUNTER — Telehealth: Payer: Self-pay | Admitting: Family Medicine

## 2015-02-24 ENCOUNTER — Encounter: Payer: Self-pay | Admitting: Family Medicine

## 2015-02-24 ENCOUNTER — Ambulatory Visit (INDEPENDENT_AMBULATORY_CARE_PROVIDER_SITE_OTHER): Payer: BC Managed Care – PPO | Admitting: Family Medicine

## 2015-02-24 ENCOUNTER — Ambulatory Visit (HOSPITAL_BASED_OUTPATIENT_CLINIC_OR_DEPARTMENT_OTHER)
Admission: RE | Admit: 2015-02-24 | Discharge: 2015-02-24 | Disposition: A | Discharge status: Home / Self Care | Payer: BC Managed Care – PPO | Source: Ambulatory Visit | Attending: Family Medicine | Admitting: Family Medicine

## 2015-02-24 ENCOUNTER — Ambulatory Visit: Payer: BC Managed Care – PPO | Admitting: Family Medicine

## 2015-02-24 VITALS — BP 121/78 | HR 90 | Temp 98.2°F | Wt 257.0 lb

## 2015-02-24 DIAGNOSIS — R062 Wheezing: Secondary | ICD-10-CM | POA: Insufficient documentation

## 2015-02-24 DIAGNOSIS — R059 Cough, unspecified: Secondary | ICD-10-CM

## 2015-02-24 DIAGNOSIS — R05 Cough: Secondary | ICD-10-CM

## 2015-02-24 DIAGNOSIS — J0100 Acute maxillary sinusitis, unspecified: Secondary | ICD-10-CM | POA: Diagnosis not present

## 2015-02-24 LAB — CBC WITH DIFFERENTIAL/PLATELET
BASOS PCT: 0.4 % (ref 0.0–3.0)
Basophils Absolute: 0 10*3/uL (ref 0.0–0.1)
EOS PCT: 1.6 % (ref 0.0–5.0)
Eosinophils Absolute: 0.1 10*3/uL (ref 0.0–0.7)
HCT: 35.5 % — ABNORMAL LOW (ref 36.0–46.0)
Hemoglobin: 11.9 g/dL — ABNORMAL LOW (ref 12.0–15.0)
LYMPHS ABS: 3.5 10*3/uL (ref 0.7–4.0)
Lymphocytes Relative: 44.9 % (ref 12.0–46.0)
MCHC: 33.5 g/dL (ref 30.0–36.0)
MCV: 81.7 fl (ref 78.0–100.0)
Monocytes Absolute: 0.4 10*3/uL (ref 0.1–1.0)
Monocytes Relative: 4.6 % (ref 3.0–12.0)
NEUTROS PCT: 48.5 % (ref 43.0–77.0)
Neutro Abs: 3.8 10*3/uL (ref 1.4–7.7)
Platelets: 314 10*3/uL (ref 150.0–400.0)
RBC: 4.35 Mil/uL (ref 3.87–5.11)
RDW: 14.4 % (ref 11.5–15.5)
WBC: 7.9 10*3/uL (ref 4.0–10.5)

## 2015-02-24 MED ORDER — ALBUTEROL SULFATE HFA 108 (90 BASE) MCG/ACT IN AERS: 2.0000 | INHALATION_SPRAY | Freq: Four times a day (QID) | RESPIRATORY_TRACT | Status: DC | PRN

## 2015-02-24 MED ORDER — AZITHROMYCIN 250 MG PO TABS
ORAL_TABLET | ORAL | Status: DC
Start: 2015-02-24 — End: 2015-03-21

## 2015-02-24 MED ORDER — HYDROCODONE-HOMATROPINE 5-1.5 MG/5ML PO SYRP: 5.0000 mL | ORAL_SOLUTION | Freq: Three times a day (TID) | ORAL | Status: DC | PRN

## 2015-02-24 NOTE — Patient Instructions (Signed)
Go get your cxr, as soon as I get results I will call you with results and medication information.

## 2015-02-24 NOTE — Progress Notes (Signed)
   Subjective:    Patient ID: Anita Black, female    DOB: May 15, 1980, 34 y.o.   MRN: [id number]  HPI  Cough with ? Mild fever: Patient is on immune modulators. Patient presents for follow-up visit today with complaints of continued cough, fever, chills, shortness of breath, wheezing and fatigue. She states she goes through coughing fits to where she almost feels like she needs to vomit. No nausea, vomit, decreased appetite. Patient states that after her recent illness, with coverage of doxycycline she did get better for a few days, then she feels like her symptoms worsened again. She states over the last 3 days her symptoms have increasingly worse. She feels achy, and her chest which she states is likely from her harsh coughing.   Past Medical History  Diagnosis Date  . Multiple sclerosis (HCC)     receives Tysabri infusion q 28 days  . Diabetes type 2, controlled (HCC)   . Migraine   . Anxiety   . Depression    No Known Allergies    Review of Systems Negative, with the exception of above mentioned in HPI     Objective:   Physical Exam BP 121/78 mmHg  Pulse 90  Temp(Src) 98.2 F (36.8 C) (Oral)  Wt 257 lb (116.574 kg)  SpO2 96%  LMP 02/19/2015 Gen: Afebrile. No acute distress. Nontoxic in appearance, appears fatigued, tearful at times. HENT: AT. [place]. Bilateral TM visualized and normal in appearance. MMM. Bilateral nares with erythema and swelling. Throat without erythema or exudates. Facial pressure frontal sinuses and maxillary sinuses Eyes:Pupils Equal Round Reactive to light, Extraocular movements intact,  Conjunctiva without redness, discharge or icterus. Neck/lymp/endocrine: Supple, mild cervical lymphadenopathy CV: RRR Chest: Very mild rhonchorous airways, no wheezing appreciated. Abd: Soft. Round. NTND. BS present. No Masses palpated.  Skin: No rashes, purpura or petechiae.       Assessment & Plan:  1. Wheezing 2. Cough - DG Chest 2 View; Future - CBC  w/Diff - Albuterol inhaler - Z-Pak  3. Acute maxillary sinusitis, recurrence not specified - Z-Pak - HYDROcodone-homatropine (HYCODAN) 5-1.5 MG/5ML syrup; Take 5 mLs by mouth every 8 (eight) hours as needed for cough.  Dispense: 120 mL; Refill: 0 Patient advised no additional refills of Hycodan

## 2015-02-24 NOTE — Telephone Encounter (Signed)
Reviewed xray results and medication instructions with patient she will call to schedule an appt if she needs to use Inhaler more than twice weekly as instructed. Patient verbalized understanding of all instructions.

## 2015-02-24 NOTE — Telephone Encounter (Signed)
Please call pt: - her xray did not show any signs of pneumonia.  - I  Have called her in an inhaler to help with bronchospasm cough. She is to use this every 6 hours (as label indicate) for 3 days, then she can use it as needed only. If she finds she needs to use this more than two times a week , I need to see her again and we need discuss pulmonology referral at that time.  - I also called her in a z-pack as well.

## 2015-02-24 NOTE — Telephone Encounter (Signed)
Left message for patient to return call.

## 2015-03-18 ENCOUNTER — Encounter: Payer: Self-pay | Admitting: Family Medicine

## 2015-03-18 ENCOUNTER — Ambulatory Visit: Payer: BC Managed Care – PPO | Admitting: Neurology

## 2015-03-18 DIAGNOSIS — G43909 Migraine, unspecified, not intractable, without status migrainosus: Secondary | ICD-10-CM | POA: Insufficient documentation

## 2015-03-21 ENCOUNTER — Telehealth: Payer: Self-pay | Admitting: Neurology

## 2015-03-21 ENCOUNTER — Encounter: Payer: Self-pay | Admitting: Neurology

## 2015-03-21 ENCOUNTER — Ambulatory Visit (INDEPENDENT_AMBULATORY_CARE_PROVIDER_SITE_OTHER): Payer: BC Managed Care – PPO | Admitting: Neurology

## 2015-03-21 VITALS — BP 134/88 | HR 70 | Resp 16 | Ht 73.0 in | Wt 260.0 lb

## 2015-03-21 DIAGNOSIS — F32A Depression, unspecified: Secondary | ICD-10-CM

## 2015-03-21 DIAGNOSIS — F329 Major depressive disorder, single episode, unspecified: Secondary | ICD-10-CM

## 2015-03-21 DIAGNOSIS — R200 Anesthesia of skin: Secondary | ICD-10-CM | POA: Diagnosis not present

## 2015-03-21 DIAGNOSIS — G43009 Migraine without aura, not intractable, without status migrainosus: Secondary | ICD-10-CM | POA: Diagnosis not present

## 2015-03-21 DIAGNOSIS — R5383 Other fatigue: Secondary | ICD-10-CM

## 2015-03-21 DIAGNOSIS — G35 Multiple sclerosis: Secondary | ICD-10-CM | POA: Diagnosis not present

## 2015-03-21 MED ORDER — AMANTADINE HCL 100 MG PO CAPS: 100.0000 mg | ORAL_CAPSULE | Freq: Two times a day (BID) | ORAL | Status: DC

## 2015-03-21 MED ORDER — AMPHETAMINE-DEXTROAMPHETAMINE 20 MG PO TABS: 20.0000 mg | ORAL_TABLET | Freq: Three times a day (TID) | ORAL | Status: DC

## 2015-03-21 NOTE — Progress Notes (Signed)
[facility]  PATIENT: Anita Black DOB: Jul 04, 1980     HISTORICAL  CHIEF COMPLAINT:  Chief Complaint  Patient presents with  . Multiple Sclerosis    Sts. she     HISTORY OF PRESENT ILLNESS:   Anita Black is a 34 year old woman with MS.     She is having more issues with fatigue and herr depression is still a problem though she felt the [name] helped reduce the 'crazy thoughts'/.  Fatigue/sleep: Fatigue is worse and is affecting her life more.    Her arms tire out very easily.   She also has mental fatigue.    Adderall has helped (20 mg at 7 am and 1 pm) .    She also has sleepiness and falls asleep when she gets home.   She does note that she sleeps well on most every night. Adderall helped her sleepiness.   She snores but has never been noted to have pauses in her breathing or gasping.  Mood/social:   She has been more tearful and is frustrated because she is so tired and feels she has less time for her family.   She notes more job stress .    She is on Trintellix and feels it has helped mood some.   We added [name] and she feels it has helped the agitation.      Cognition: Cognition has been a problem .   She notes reduced STM and focus is reduced.  . She also  notes difficulty with verbal fluency.   Gait/strength/sensation: She has mild difficulty with her balance and gait.   She is having more pain in both legs from the hips and down.   This pain started earlier in the year below the knees but is worse now and covers a larger area.  Pain seems in the bones.    Bladder/bowel: She denies any bladder dysfunction.   She has bowel incontinence 1-2 times monthly  She can't get to the bathroom in times when it occurs.  Vision: In the past, she has had optic neuritis involving each eye. Vision returned towards normal. She has not had diplopia.  Migraine headaches: Self injected toradol helps and she has not needed ER/UC visits.     MS History:  She was  diagnosed with multiple sclerosis in 2009 after presenting with numbness in the arms and torso, primarily in the front of her body. An MRI of the brain was normal but an MRI of the spinal cord showed 2 cervical plaques. She was started on Rebif but had difficulty tolerating the medication and after about a year switched to Copaxone. Unfortunately, she also had trouble tolerating Copaxone. She then was on Copaxone and had injeciotn reactions and switched to Avonex.  After her pregnanacy, she was switched to Gilenya. However, she had bradycardia and that was discontinued. In January 2015 she started Tysabri and continues on that medication. [place] in July [place]. She continues her Tysabri treatments here.     She is JCV antibody negative. She feels better with the Tysabri infusions in general. However, she gets nausea that lasts several days after each infusion.   I personally reviewed her MRI from 07/01/14 showing about 10-12 hyperintense foci consistent with MS. No lesions were seen in the posterior fossa.    ROS:  Out of a complete 14 system review of symptoms, the patient complains only of the following symptoms, and all other reviewed systems are negative.  She notes fatigue and depression.   She has headaches, anddifficulty with attention, Snoring, rare bowel incontinence   ALLERGIES: No Known Allergies  HOME MEDICATIONS:  Current outpatient prescriptions:  .  alprazolam (XANAX) 2 MG tablet, Take 2 mg by mouth 3 (three) times daily as needed. , Disp: , Rfl:  .  amphetamine-dextroamphetamine (ADDERALL) 20 MG tablet, Take 1 tablet (20 mg total) by mouth 2 (two) times daily., Disp: 60 tablet, Rfl: 0 .  ARIPiprazole ([name]) 5 MG tablet, Take 1 tablet (5 mg total) by mouth daily., Disp: 30 tablet, Rfl: 5 .  glycopyrrolate (ROBINUL) 2 MG tablet, Take 2 mg by mouth 2 (two) times daily. , Disp: , Rfl:  .  ketorolac (TORADOL) 30 MG/ML injection, Inject 1 mL (30 mg total) into the  vein once. (Patient taking differently: Inject 30 mg into the muscle once. ), Disp: 8 mL, Rfl: 5 .  metFORMIN (GLUCOPHAGE) 1000 MG tablet, Take 1,000 mg by mouth 2 (two) times daily with a meal. , Disp: , Rfl:  .  natalizumab (TYSABRI) 300 MG/15ML injection, Inject into the vein. Every 28 days, Disp: , Rfl:  .  promethazine (PHENERGAN) 25 MG tablet, Take 1 tablet (25 mg total) by mouth every 6 (six) hours as needed for nausea or vomiting., Disp: 12 tablet, Rfl: 0 .  rizatriptan (MAXALT-MLT) 10 MG disintegrating tablet, Place 10 mg under the tongue as needed. , Disp: , Rfl:  .  SPRINTEC 28 0.25-35 MG-MCG tablet, Take 1 tablet by mouth daily., Disp: , Rfl:  .  TRINTELLIX 20 MG TABS, Take 1 tablet by mouth daily., Disp: , Rfl:  .  zolmitriptan (ZOMIG-ZMT) 5 MG disintegrating tablet, Take 1 tablet by mouth as needed., Disp: , Rfl:  .  VENTOLIN HFA 108 (90 BASE) MCG/ACT inhaler, , Disp: , Rfl:   PAST MEDICAL HISTORY: Past Medical History  Diagnosis Date  . Multiple sclerosis (HCC)     receives Tysabri infusion q 28 days  . Diabetes type 2, controlled (HCC)   . Migraine   . Anxiety   . Depression   . Hyperlipidemia   . Elevated hemoglobin A1c     PAST SURGICAL HISTORY: Past Surgical History  Procedure Laterality Date  . Cholecystectomy    . Cesarean section    . Abdominoplasty      FAMILY HISTORY: Family History  Problem Relation Age of Onset  . Diabetes Mother   . Hypertension Mother   . Dementia Mother   . Bipolar disorder Mother   . Heart failure Father   . Hypertension Father   . Heart disease Father   . Ovarian cancer Maternal Grandmother   . Alzheimer's disease Paternal Grandmother   . Skin cancer Maternal Grandfather     SOCIAL HISTORY:  Social History   Social History  . Marital Status: Married    Spouse Name: Rollen Sox  . Number of Children: 1  . Years of Education: MA   Occupational History  .  [employer]   Social History Main Topics  . Smoking  status: Never Smoker   . Smokeless tobacco: Never Used  . Alcohol Use: Yes     Comment: socially; once a week  . Drug Use: No  . Sexual Activity: Not on file   Other Topics Concern  . Not on file   Social History Narrative   Patient lives at home with family.   Caffeine Use: 1-2 cups daily     PHYSICAL EXAM  Filed Vitals:  03/21/15 0812  BP: 134/88  Pulse: 70  Resp: 16  Height:  (1.854 m)  Weight: 260 lb (117.935 kg)    Body mass index is 34.31 kg/(m^2).   General: The patient is well-developed and well-nourished and in no acute distress   Neurologic Exam  Mental status: The patient is alert and oriented x 3 at the time of the examination. The patient has apparent normal recent and remote memory, with an apparently normal attention span and concentration ability.   Speech is normal.  Cranial nerves: Extraocular movements are full.  Facial symmetry is present. There is good facial sensation to soft touch bilaterally.Facial strength is normal.  Trapezius and sternocleidomastoid strength is normal. No dysarthria is noted. No obvious hearing deficits are noted.  Motor:  Muscle bulk is normal.   Tone is normal. Strength is  5 / 5 in all 4 extremities.   Sensory: Sensory testing is intact to pinprick, soft touch and vibration sensation in all 4 extremities.  Coordination: Cerebellar testing reveals good finger-nose-finger and heel-to-shin bilaterally.  Gait and station: Station is normal.   Gait is mildly wide. Tandem gait is wide. Romberg is negative.   Reflexes: Deep tendon reflexes are symmetric and 1+ bilaterally.     DIAGNOSTIC DATA (LABS, IMAGING, TESTING) - I reviewed patient records, labs, notes, testing and imaging myself where available.  Lab Results  Component Value Date   WBC 7.9 02/24/2015   HGB 11.9* 02/24/2015   HCT 35.5* 02/24/2015   MCV 81.7 02/24/2015   PLT 314.0 02/24/2015      Component Value Date/Time   NA 137 04/15/2014 1554   K  4.2 04/15/2014 1554   CL 102 04/15/2014 1554   CO2 20 04/15/2014 1554   GLUCOSE 94 04/15/2014 1554   BUN 15 04/15/2014 1554   CREATININE 0.77 04/15/2014 1554   CALCIUM 9.0 04/15/2014 1554   PROT 6.8 05/27/2014 1624   ALBUMIN 4.2 05/27/2014 1624   AST 16 05/27/2014 1624   ALT 19 05/27/2014 1624   ALKPHOS 134* 05/27/2014 1624   BILITOT 0.4 05/27/2014 1624   GFRNONAA 102 04/15/2014 1554   GFRAA 117 04/15/2014 1554       ASSESSMENT AND PLAN  MS (multiple sclerosis) (HCC)  Numbness  Common migraine without intractability  Other fatigue  Depression    1.   Continue Tysabri for her multiple sclerosis.  2.   Continue Trintellix and [name] 5 mg.  if depression worsens, she needs to go back to see psychiatry. 3.   Renew Adderall at a higher dose.    Ad amantadine for fatigue 4   Return to clinic in 4 months for a regular follow-up visit or call sooner if new or worsening neurologic symptoms.  [name]. Epimenio Foot, MD, PhD 03/21/2015, 8:18 AM Certified in Neurology, Clinical Neurophysiology, Sleep Medicine, Pain Medicine and Neuroimaging  [facility] [address], [address] [place], [place] [address] [phone]

## 2015-03-21 NOTE — Telephone Encounter (Signed)
I have spoken with pharmacist this morning--he sts. capsules are temporarily unavailable--verbal ok to give tablets was given.  I have confirmed tablets will not be at an extra cost to pt./fim

## 2015-03-21 NOTE — Telephone Encounter (Signed)
Doreen with [facility] in [place] is calling regarding Rx amantadine (SYMMETREL) 100 MG capsule for the patient. The pharmacy says they do not have capsules but they do have pills. Is this ok? Please call and advise. Thank you.

## 2015-03-24 ENCOUNTER — Telehealth: Payer: Self-pay | Admitting: Neurology

## 2015-03-24 NOTE — Telephone Encounter (Signed)
Anita Black with [facility] claims. She sts for 06/19/14 a prior [name] is seen thru touch program but [facility] is saying no prior [name] was done and is denying the claim. Please call at [phone] and can leave VM.

## 2015-03-24 NOTE — Telephone Encounter (Signed)
I have spoken with Amy and confirmed that there was no pa done for the Feb. infusion/fim

## 2015-03-31 ENCOUNTER — Other Ambulatory Visit: Payer: BC Managed Care – PPO

## 2015-04-01 ENCOUNTER — Encounter: Payer: Self-pay | Admitting: Neurology

## 2015-04-01 ENCOUNTER — Encounter: Payer: Self-pay | Admitting: Family Medicine

## 2015-04-01 ENCOUNTER — Ambulatory Visit (INDEPENDENT_AMBULATORY_CARE_PROVIDER_SITE_OTHER): Payer: BC Managed Care – PPO | Admitting: Family Medicine

## 2015-04-01 VITALS — BP 116/83 | HR 78 | Temp 97.6°F | Resp 20 | Wt 262.2 lb

## 2015-04-01 DIAGNOSIS — Z789 Other specified health status: Secondary | ICD-10-CM | POA: Diagnosis not present

## 2015-04-01 NOTE — Patient Instructions (Signed)
I will call you with the results once they are available.

## 2015-04-01 NOTE — Progress Notes (Signed)
   Subjective:    Patient ID: Anita Black, female    DOB: [birth year], 34 y.o.   MRN: [id number]  HPI  Immunizations status unknown: Pt is starting a newer position with her employer, which will require her to go into the hospital. They are requesting MMR and varicella titers for proof of immunization since she is unable to provide immunization proof. Pt believes she has had the MMR series and she knows she had the chicken pox at least once, possibly twice as a child. Of note, pt has MS and is on an immune modulator infusion every 28 days.    Past Medical History  Diagnosis Date  . Multiple sclerosis (HCC)     receives Tysabri infusion q 28 days  . Diabetes type 2, controlled ([place])   . Migraine   . Anxiety   . Depression   . Hyperlipidemia   . Elevated hemoglobin A1c    No Known Allergies  Social History   Social History  . Marital Status: Married    Spouse Name: [name]  . Number of Children: 1  . Years of Education: MA   Occupational History  .  [place] History Main Topics  . Smoking status: Never Smoker   . Smokeless tobacco: Never Used  . Alcohol Use: Yes     Comment: socially; once a week  . Drug Use: No  . Sexual Activity: Not on file   Other Topics Concern  . Not on file   Social History Narrative   Patient lives at home with family.   Caffeine Use: 1-2 cups daily    Review of Systems Negative, with the exception of above mentioned in HPI     Objective:   Physical Exam BP 116/83 mmHg  Pulse 78  Temp(Src) 97.6 F (36.4 C) (Oral)  Resp 20  Wt 262 lb 4 oz (118.956 kg)  SpO2 96%  LMP 03/21/2015 Gen: Afebrile. No acute distress. Nontoxic in appearance. Well developed, well nourished. Obese female.  HENT: AT. [place]. MMM.  Eyes:Pupils Equal Round Reactive to light, Extraocular movements intact,  Conjunctiva without redness, discharge or icterus. Psych: Normal affect, dress and demeanor. Normal speech. Normal thought content and  judgment..      Assessment & Plan:  Measles, mumps, rubella (MMR) vaccination status unknown/Unknown varicella vaccination status - if titer low, will need to contact neuro on safety of immunization with immune modulator infusion every 28d.  - Measles/Mumps/Rubella Immunity - Varicella zoster antibody, IgG - pt will be called with results once available.   F/U 1-2 months with CPE and fasting labs.

## 2015-04-02 ENCOUNTER — Telehealth: Payer: Self-pay | Admitting: Family Medicine

## 2015-04-02 LAB — MEASLES/MUMPS/RUBELLA IMMUNITY
MUMPS IGG: 64 [AU]/ml — AB (ref <9.00)
RUBELLA: 2.24 {index} — AB (ref <0.90)
RUBEOLA IGG: 51 [AU]/ml — AB (ref <25.00)

## 2015-04-02 LAB — VARICELLA ZOSTER ANTIBODY, IGG: Varicella IgG: 1199 Index — ABNORMAL HIGH (ref <135.00)

## 2015-04-02 NOTE — Telephone Encounter (Signed)
Left message with lab results on patient voice mail 

## 2015-04-02 NOTE — Telephone Encounter (Signed)
Please call pt: - all of her titers are positive. She can either go on mychart to get copies or we can print a copy for her.

## 2015-04-24 ENCOUNTER — Other Ambulatory Visit: Payer: Self-pay | Admitting: *Deleted

## 2015-04-24 MED ORDER — AMPHETAMINE-DEXTROAMPHETAMINE 20 MG PO TABS: 20.0000 mg | ORAL_TABLET | Freq: Three times a day (TID) | ORAL | Status: DC

## 2015-04-24 NOTE — Telephone Encounter (Signed)
Adderall rx. provided at infusion appt/fim 

## 2015-05-01 ENCOUNTER — Encounter: Payer: Self-pay | Admitting: Family Medicine

## 2015-05-01 ENCOUNTER — Ambulatory Visit (INDEPENDENT_AMBULATORY_CARE_PROVIDER_SITE_OTHER): Payer: BC Managed Care – PPO | Admitting: Family Medicine

## 2015-05-01 ENCOUNTER — Other Ambulatory Visit: Payer: Self-pay | Admitting: Neurology

## 2015-05-01 VITALS — BP 125/83 | HR 72 | Temp 97.7°F | Resp 20 | Wt 259.5 lb

## 2015-05-01 DIAGNOSIS — G35 Multiple sclerosis: Secondary | ICD-10-CM

## 2015-05-01 DIAGNOSIS — Z6834 Body mass index (BMI) 34.0-34.9, adult: Secondary | ICD-10-CM | POA: Diagnosis not present

## 2015-05-01 DIAGNOSIS — R5383 Other fatigue: Secondary | ICD-10-CM

## 2015-05-01 DIAGNOSIS — Z0000 Encounter for general adult medical examination without abnormal findings: Secondary | ICD-10-CM | POA: Insufficient documentation

## 2015-05-01 LAB — COMPREHENSIVE METABOLIC PANEL
ALT: 9 U/L (ref 0–35)
AST: 10 U/L (ref 0–37)
Albumin: 4.2 g/dL (ref 3.5–5.2)
Alkaline Phosphatase: 100 U/L (ref 39–117)
BILIRUBIN TOTAL: 0.8 mg/dL (ref 0.2–1.2)
BUN: 14 mg/dL (ref 6–23)
CALCIUM: 9.5 mg/dL (ref 8.4–10.5)
CO2: 25 mEq/L (ref 19–32)
Chloride: 104 mEq/L (ref 96–112)
Creatinine, Ser: 0.67 mg/dL (ref 0.40–1.20)
GFR: 106.61 mL/min (ref >60.00)
Glucose, Bld: 108 mg/dL — ABNORMAL HIGH (ref 70–99)
Potassium: 4.4 mEq/L (ref 3.5–5.1)
Sodium: 137 mEq/L (ref 135–145)
TOTAL PROTEIN: 6.8 g/dL (ref 6.0–8.3)

## 2015-05-01 LAB — CBC WITH DIFFERENTIAL/PLATELET
BASOS ABS: 0 10*3/uL (ref 0.0–0.1)
Basophils Relative: 0.4 % (ref 0.0–3.0)
Eosinophils Absolute: 0.1 10*3/uL (ref 0.0–0.7)
Eosinophils Relative: 1.1 % (ref 0.0–5.0)
HEMATOCRIT: 36.8 % (ref 36.0–46.0)
HEMOGLOBIN: 12.1 g/dL (ref 12.0–15.0)
LYMPHS PCT: 38.8 % (ref 12.0–46.0)
Lymphs Abs: 2.7 10*3/uL (ref 0.7–4.0)
MCHC: 33 g/dL (ref 30.0–36.0)
MCV: 82 fl (ref 78.0–100.0)
MONOS PCT: 3.4 % (ref 3.0–12.0)
Monocytes Absolute: 0.2 10*3/uL (ref 0.1–1.0)
Neutro Abs: 3.9 10*3/uL (ref 1.4–7.7)
Neutrophils Relative %: 56.3 % (ref 43.0–77.0)
Platelets: 286 10*3/uL (ref 150.0–400.0)
RBC: 4.49 Mil/uL (ref 3.87–5.11)
RDW: 14.1 % (ref 11.5–15.5)
WBC: 6.9 10*3/uL (ref 4.0–10.5)

## 2015-05-01 LAB — TSH: TSH: 1.76 u[IU]/mL (ref 0.35–4.50)

## 2015-05-01 LAB — HEMOGLOBIN A1C: HEMOGLOBIN A1C: 6.4 % (ref 4.6–6.5)

## 2015-05-01 MED ORDER — KETOROLAC TROMETHAMINE 30 MG/ML IJ SOLN: INTRAMUSCULAR | Status: DC

## 2015-05-01 NOTE — Progress Notes (Signed)
Patient ID: Anita Black, female   DOB: 07/17/1980, 34 y.o.   MRN: [id number]      Patient ID: Anita Black, _0 @    DOB: 18-Sep-1980, 34 y.o.   MRN: [id number]  Subjective:  Anita Black is a 34 y.o.  female present for annual exam All past medical history, surgical history, allergies, family history, immunizations, medications and social history were updated in the electronic medical record today. All recent labs, ED visits and hospitalizations within the last year were reviewed.  Health maintenance:  Colonoscopy: Colon cancer screen at age 12, No FHX Mammogram: No FHX, age 62-45 screen.  Cervical cancer screening: June 2016, 1 year, abnl this time. Dr. Lynnette Caffey ([facility]). Immunizations: Tdap UTD, Flu UTD. Infectious disease screening: HIV completed.  DEXA: Had been on chronic steroids for MS in the past Assistive device: None Oxygen use: None Patient has a Dental home. Hospitalizations/ED visits: None Patient takes Vit D 5000 daily.    Past Medical History  Diagnosis Date  . Multiple sclerosis (HCC)     receives Tysabri infusion q 28 days  . Diabetes type 2, controlled ([place])   . Migraine   . Anxiety   . Depression   . Hyperlipidemia   . Elevated hemoglobin A1c    No Known Allergies Past Surgical History  Procedure Laterality Date  . Cholecystectomy    . Cesarean section    . Abdominoplasty     Family History  Problem Relation Age of Onset  . Diabetes Mother   . Hypertension Mother   . Dementia Mother   . Bipolar disorder Mother   . Heart failure Father   . Hypertension Father   . Heart disease Father   . Ovarian cancer Maternal Grandmother   . Alzheimer's disease Paternal Grandmother   . Skin cancer Maternal Grandfather    Social History   Social History  . Marital Status: Married    Spouse Name: [name]  . Number of Children: 1  . Years of Education: MA   Occupational History  .  [place] History Main  Topics  . Smoking status: Never Smoker   . Smokeless tobacco: Never Used  . Alcohol Use: Yes     Comment: socially; once a week  . Drug Use: No  . Sexual Activity: Yes   Other Topics Concern  . Not on file   Social History Narrative   Patient lives at home with family.   Caffeine Use: 1-2 cups daily   No tobacco, recreational drugs. Social drinking only.   Wears her seatbelt, has smoke alarm in the home.   Feels safe in her relationships.   ROS: Negative, with the exception of above mentioned in HPI  Objective: BP 125/83 mmHg  Pulse 72  Temp(Src) 97.7 F (36.5 C) (Oral)  Resp 20  Wt 259 lb 8 oz (117.708 kg)  SpO2 95%  LMP 04/10/2015  Body mass index is 34.24 kg/(m^2). Gen: Afebrile. No acute distress. Nontoxic in appearance, well-developed, well-nourished, female, caucasian, pleasant.  HENT: AT. [place]. Bilateral TM visualized and normal in appearance, normal external auditory canal. MMM, no oral lesions, good dentition. Bilateral nares without erythema or swelling. Throat without erythema, ulcerations or exudates. No Cough on exam, no hoarseness on exam. Eyes:Pupils Equal Round Reactive to light, Extraocular movements intact,  Conjunctiva without redness, discharge or icterus. Neck/lymp/endocrine: Supple, no lymphadenopathy, no thyromegaly CV: RRR no murmur/clicks/gallops/rubs appreciated, no edema, +2/4 P posterior tibialis pulses. No carotid bruits.  No JVD. Chest: CTAB, no wheeze, rhonchi or crackles. Normal Respiratory effort. Good Air movement. Abd: Soft. Round. NTND. BS present. No Masses palpated. No hepatosplenomegaly. No rebound tenderness or guarding. Skin: No rashes, purpura or petechiae. Warm and well-perfused. Skin intact. Neuro/Msk:  Normal gait. PERLA. EOMi. Alert. Oriented x3.  Cranial nerves II through XII intact. Muscle strength 5/5 upper and lower extremity. DTRs equal bilaterally. Psych: Normal affect, dress and demeanor. Normal speech. Normal thought content  and judgment.   Assessment/plan: Anita Black is a 34 y.o. female present for annual exam/preventive health exam.  1. Encounter for preventive health examination - CBC w/Diff - Comp Met (CMET) Patient was encouraged to exercise greater than 150 minutes a week. Patient was encouraged to choose a diet filled with fresh fruits and vegetables, and lean meats. AVS provided to patient today for education/recommendation on gender specific health and safety maintenance. Health maintenance:  Colonoscopy: Colon cancer screen at age 85, No FHX Mammogram: No FHX, age 17-45 screen.  Cervical cancer screening: June 2016, 1 year, abnl this time. Dr. Lynnette Caffey ([facility]). Immunizations: Tdap UTD, Flu UTD. Infectious disease screening: HIV completed.  DEXA: Had been on chronic steroids for MS in the past, consider early screening.  2. MS (multiple sclerosis) ([place]) - Continue following with Neurology - CBC w/Diff - Comp Met (CMET) - TSH - VITAMIN D 25 Hydroxy (Vit-D Deficiency, Fractures); Future  3. Other fatigue - improving mildly since over sinus infections, However still gaining weight and has h/o of low vit D.  - CBC w/Diff - Comp Met (CMET) - TSH - VITAMIN D 25 Hydroxy (Vit-D Deficiency, Fractures); Future  4. BMI 34.0-34.9,adult - was on diabetic at one time by  Prior PCP notes. Currently not on medications.  - HgB A1c   Howard Pouch, DO [place]

## 2015-05-01 NOTE — Patient Instructions (Signed)
Health Maintenance, Female Adopting a healthy lifestyle and getting preventive care can go a long way to promote health and wellness. Talk with your health care provider about what schedule of regular examinations is right for you. This is a good chance for you to check in with your provider about disease prevention and staying healthy. In between checkups, there are plenty of things you can do on your own. Experts have done a lot of research about which lifestyle changes and preventive measures are most likely to keep you healthy. Ask your health care provider for more information. WEIGHT AND DIET  Eat a healthy diet  Be sure to include plenty of vegetables, fruits, low-fat dairy products, and lean protein.  Do not eat a lot of foods high in solid fats, added sugars, or salt.  Get regular exercise. This is one of the most important things you can do for your health.  Most adults should exercise for at least 150 minutes each week. The exercise should increase your heart rate and make you sweat (moderate-intensity exercise).  Most adults should also do strengthening exercises at least twice a week. This is in addition to the moderate-intensity exercise.  Maintain a healthy weight  Body mass index (BMI) is a measurement that can be used to identify possible weight problems. It estimates body fat based on height and weight. Your health care provider can help determine your BMI and help you achieve or maintain a healthy weight.  For females 20 years of age and older:   A BMI below 18.5 is considered underweight.  A BMI of 18.5 to 24.9 is normal.  A BMI of 25 to 29.9 is considered overweight.  A BMI of 30 and above is considered obese.  Watch levels of cholesterol and blood lipids  You should start having your blood tested for lipids and cholesterol at 34 years of age, then have this test every 5 years.  You may need to have your cholesterol levels checked more often if:  Your lipid  or cholesterol levels are high.  You are older than 34 years of age.  You are at high risk for heart disease.  CANCER SCREENING   Lung Cancer  Lung cancer screening is recommended for adults 55-80 years old who are at high risk for lung cancer because of a history of smoking.  A yearly low-dose CT scan of the lungs is recommended for people who:  Currently smoke.  Have quit within the past 15 years.  Have at least a 30-pack-year history of smoking. A pack year is smoking an average of one pack of cigarettes a day for 1 year.  Yearly screening should continue until it has been 15 years since you quit.  Yearly screening should stop if you develop a health problem that would prevent you from having lung cancer treatment.  Breast Cancer  Practice breast self-awareness. This means understanding how your breasts normally appear and feel.  It also means doing regular breast self-exams. Let your health care provider know about any changes, no matter how small.  If you are in your 20s or 30s, you should have a clinical breast exam (CBE) by a health care provider every 1-3 years as part of a regular health exam.  If you are 40 or older, have a CBE every year. Also consider having a breast X-ray (mammogram) every year.  If you have a family history of breast cancer, talk to your health care provider about genetic screening.  If you   are at high risk for breast cancer, talk to your health care provider about having an MRI and a mammogram every year.  Breast cancer gene (BRCA) assessment is recommended for women who have family members with BRCA-related cancers. BRCA-related cancers include:  Breast.  Ovarian.  Tubal.  Peritoneal cancers.  Results of the assessment will determine the need for genetic counseling and BRCA1 and BRCA2 testing. Cervical Cancer Your health care provider may recommend that you be screened regularly for cancer of the pelvic organs (ovaries, uterus, and  vagina). This screening involves a pelvic examination, including checking for microscopic changes to the surface of your cervix (Pap test). You may be encouraged to have this screening done every 3 years, beginning at age 21.  For women ages 30-65, health care providers may recommend pelvic exams and Pap testing every 3 years, or they may recommend the Pap and pelvic exam, combined with testing for human papilloma virus (HPV), every 5 years. Some types of HPV increase your risk of cervical cancer. Testing for HPV may also be done on women of any age with unclear Pap test results.  Other health care providers may not recommend any screening for nonpregnant women who are considered low risk for pelvic cancer and who do not have symptoms. Ask your health care provider if a screening pelvic exam is right for you.  If you have had past treatment for cervical cancer or a condition that could lead to cancer, you need Pap tests and screening for cancer for at least 20 years after your treatment. If Pap tests have been discontinued, your risk factors (such as having a new sexual partner) need to be reassessed to determine if screening should resume. Some women have medical problems that increase the chance of getting cervical cancer. In these cases, your health care provider may recommend more frequent screening and Pap tests. Colorectal Cancer  This type of cancer can be detected and often prevented.  Routine colorectal cancer screening usually begins at 34 years of age and continues through 34 years of age.  Your health care provider may recommend screening at an earlier age if you have risk factors for colon cancer.  Your health care provider may also recommend using home test kits to check for hidden blood in the stool.  A small camera at the end of a tube can be used to examine your colon directly (sigmoidoscopy or colonoscopy). This is done to check for the earliest forms of colorectal  cancer.  Routine screening usually begins at age 50.  Direct examination of the colon should be repeated every 5-10 years through 34 years of age. However, you may need to be screened more often if early forms of precancerous polyps or small growths are found. Skin Cancer  Check your skin from head to toe regularly.  Tell your health care provider about any new moles or changes in moles, especially if there is a change in a mole's shape or color.  Also tell your health care provider if you have a mole that is larger than the size of a pencil eraser.  Always use sunscreen. Apply sunscreen liberally and repeatedly throughout the day.  Protect yourself by wearing long sleeves, pants, a wide-brimmed hat, and sunglasses whenever you are outside. HEART DISEASE, DIABETES, AND HIGH BLOOD PRESSURE   High blood pressure causes heart disease and increases the risk of stroke. High blood pressure is more likely to develop in:  People who have blood pressure in the high end   of the normal range (130-139/85-89 mm Hg).  People who are overweight or obese.  People who are African American.  If you are 38-23 years of age, have your blood pressure checked every 3-5 years. If you are 61 years of age or older, have your blood pressure checked every year. You should have your blood pressure measured twice--once when you are at a hospital or clinic, and once when you are not at a hospital or clinic. Record the average of the two measurements. To check your blood pressure when you are not at a hospital or clinic, you can use:  An automated blood pressure machine at a pharmacy.  A home blood pressure monitor.  If you are between 45 years and 39 years old, ask your health care provider if you should take aspirin to prevent strokes.  Have regular diabetes screenings. This involves taking a blood sample to check your fasting blood sugar level.  If you are at a normal weight and have a low risk for diabetes,  have this test once every three years after 34 years of age.  If you are overweight and have a high risk for diabetes, consider being tested at a younger age or more often. PREVENTING INFECTION  Hepatitis B  If you have a higher risk for hepatitis B, you should be screened for this virus. You are considered at high risk for hepatitis B if:  You were born in a country where hepatitis B is common. Ask your health care provider which countries are considered high risk.  Your parents were born in a high-risk country, and you have not been immunized against hepatitis B (hepatitis B vaccine).  You have HIV or AIDS.  You use needles to inject street drugs.  You live with someone who has hepatitis B.  You have had sex with someone who has hepatitis B.  You get hemodialysis treatment.  You take certain medicines for conditions, including cancer, organ transplantation, and autoimmune conditions. Hepatitis C  Blood testing is recommended for:  Everyone born from [birth year] through 1965.  Anyone with known risk factors for hepatitis C. Sexually transmitted infections (STIs)  You should be screened for sexually transmitted infections (STIs) including gonorrhea and chlamydia if:  You are sexually active and are younger than 34 years of age.  You are older than 34 years of age and your health care provider tells you that you are at risk for this type of infection.  Your sexual activity has changed since you were last screened and you are at an increased risk for chlamydia or gonorrhea. Ask your health care provider if you are at risk.  If you do not have HIV, but are at risk, it may be recommended that you take a prescription medicine daily to prevent HIV infection. This is called pre-exposure prophylaxis (PrEP). You are considered at risk if:  You are sexually active and do not regularly use condoms or know the HIV status of your partner(s).  You take drugs by injection.  You are sexually  active with a partner who has HIV. Talk with your health care provider about whether you are at high risk of being infected with HIV. If you choose to begin PrEP, you should first be tested for HIV. You should then be tested every 3 months for as long as you are taking PrEP.  PREGNANCY   If you are premenopausal and you may become pregnant, ask your health care provider about preconception counseling.  If you may  become pregnant, take 400 to 800 micrograms (mcg) of folic acid every day.  If you want to prevent pregnancy, talk to your health care provider about birth control (contraception). OSTEOPOROSIS AND MENOPAUSE   Osteoporosis is a disease in which the bones lose minerals and strength with aging. This can result in serious bone fractures. Your risk for osteoporosis can be identified using a bone density scan.  If you are 61 years of age or older, or if you are at risk for osteoporosis and fractures, ask your health care provider if you should be screened.  Ask your health care provider whether you should take a calcium or vitamin D supplement to lower your risk for osteoporosis.  Menopause may have certain physical symptoms and risks.  Hormone replacement therapy may reduce some of these symptoms and risks. Talk to your health care provider about whether hormone replacement therapy is right for you.  HOME CARE INSTRUCTIONS   Schedule regular health, dental, and eye exams.  Stay current with your immunizations.   Do not use any tobacco products including cigarettes, chewing tobacco, or electronic cigarettes.  If you are pregnant, do not drink alcohol.  If you are breastfeeding, limit how much and how often you drink alcohol.  Limit alcohol intake to no more than 1 drink per day for nonpregnant women. One drink equals 12 ounces of beer, 5 ounces of wine, or 1 ounces of hard liquor.  Do not use street drugs.  Do not share needles.  Ask your health care provider for help if  you need support or information about quitting drugs.  Tell your health care provider if you often feel depressed.  Tell your health care provider if you have ever been abused or do not feel safe at home.   This information is not intended to replace advice given to you by your health care provider. Make sure you discuss any questions you have with your health care provider.   Document Released: 11/09/2010 Document Revised: 05/17/2014 Document Reviewed: 03/28/2013 Elsevier Interactive Patient Education [employer].

## 2015-05-01 NOTE — Telephone Encounter (Signed)
Dr Sater is out of the office.  Request forwarded to [facility] for review.   

## 2015-05-06 ENCOUNTER — Other Ambulatory Visit (INDEPENDENT_AMBULATORY_CARE_PROVIDER_SITE_OTHER): Payer: BC Managed Care – PPO

## 2015-05-06 ENCOUNTER — Telehealth: Payer: Self-pay | Admitting: Family Medicine

## 2015-05-06 DIAGNOSIS — R5383 Other fatigue: Secondary | ICD-10-CM

## 2015-05-06 DIAGNOSIS — Z0000 Encounter for general adult medical examination without abnormal findings: Secondary | ICD-10-CM

## 2015-05-06 DIAGNOSIS — Z6834 Body mass index (BMI) 34.0-34.9, adult: Secondary | ICD-10-CM | POA: Diagnosis not present

## 2015-05-06 DIAGNOSIS — G35 Multiple sclerosis: Secondary | ICD-10-CM

## 2015-05-06 NOTE — Telephone Encounter (Signed)
Please call pt: - her labs are all normal, with the exception of her a1c. - Her a1c is 6.4, >6.5 would be considered diabetic. Pt has been on medications in the pats for diabetes.  - Pt should be encouraged to exercise >150 minutes a week and eat a lower carb/sugar content diet, more fresh vegetables and lean meats/fishes. We will  Need to recheck this in 6 months, if elevated will need to consider restart of diabetic meds.

## 2015-05-06 NOTE — Telephone Encounter (Signed)
Patient aware of results.  Pt also made aware of diet / exercise recommendations.  Pt questioned her vitamin D level, for some reason that wasn't drawn and it is in future.  I am sending add on form to lab.

## 2015-05-06 NOTE — Telephone Encounter (Signed)
Left message with lab results and instructions on patient voice mail 

## 2015-05-07 ENCOUNTER — Telehealth: Payer: Self-pay | Admitting: Family Medicine

## 2015-05-07 LAB — VITAMIN D 25 HYDROXY (VIT D DEFICIENCY, FRACTURES): VITD: 36.01 ng/mL (ref 30.00–100.00)

## 2015-05-07 NOTE — Telephone Encounter (Signed)
Please call pt: - Her Vit d level is normal (it is lower end of normal, 36). She should take at least 800-1000u OTC Vit D daily to keep her Vitamin D adequate.

## 2015-05-07 NOTE — Telephone Encounter (Signed)
Left message with results and instructions on patient voice mail. 

## 2015-05-22 ENCOUNTER — Telehealth: Payer: Self-pay | Admitting: *Deleted

## 2015-05-22 NOTE — Telephone Encounter (Signed)
I spoke with Anita Black this afternoon during her infusion appt.  She is currently on Adderall 20mg  tid.  Insurance will now only cover #30 tabs every 20 days.  Adderall has worked the best for her.  She has tried in the past:  Ritalin--didn't help Provigil--Insurance wouldn't cover Nuvigil--Insurance covered but it gave her a h/a. Phentermine--Insurance would not cover Amantadine--Made her feel foggy. Do you think ins. would cover an XR as well as an IR dose of Adderall?

## 2015-05-23 MED ORDER — LISDEXAMFETAMINE DIMESYLATE 60 MG PO CAPS: 60.0000 mg | ORAL_CAPSULE | ORAL | Status: DC

## 2015-05-23 NOTE — Telephone Encounter (Signed)
I have spoken with [facility] and per RAS, offered [name] 60mg .  She is agreeable.  Rx. printed, signed, up front [facility] for her to pick up today/fim

## 2015-05-23 NOTE — Telephone Encounter (Signed)
Let'ssee if we can get [name] 60 mg daily approved.

## 2015-05-29 ENCOUNTER — Encounter: Payer: Self-pay | Admitting: *Deleted

## 2015-06-06 ENCOUNTER — Encounter: Payer: Self-pay | Admitting: *Deleted

## 2015-06-09 ENCOUNTER — Encounter: Payer: Self-pay | Admitting: Neurology

## 2015-06-09 ENCOUNTER — Other Ambulatory Visit: Payer: Self-pay | Admitting: Neurology

## 2015-06-10 ENCOUNTER — Ambulatory Visit: Payer: Self-pay | Admitting: Neurology

## 2015-06-11 ENCOUNTER — Encounter: Payer: Self-pay | Admitting: Neurology

## 2015-06-11 ENCOUNTER — Ambulatory Visit (INDEPENDENT_AMBULATORY_CARE_PROVIDER_SITE_OTHER): Payer: BC Managed Care – PPO | Admitting: Neurology

## 2015-06-11 ENCOUNTER — Other Ambulatory Visit: Payer: Self-pay | Admitting: Neurology

## 2015-06-11 VITALS — BP 120/88 | HR 86 | Resp 16 | Ht 73.0 in | Wt 259.0 lb

## 2015-06-11 DIAGNOSIS — M542 Cervicalgia: Secondary | ICD-10-CM | POA: Insufficient documentation

## 2015-06-11 DIAGNOSIS — G43009 Migraine without aura, not intractable, without status migrainosus: Secondary | ICD-10-CM

## 2015-06-11 DIAGNOSIS — R200 Anesthesia of skin: Secondary | ICD-10-CM

## 2015-06-11 DIAGNOSIS — G35 Multiple sclerosis: Secondary | ICD-10-CM | POA: Diagnosis not present

## 2015-06-11 DIAGNOSIS — R5383 Other fatigue: Secondary | ICD-10-CM

## 2015-06-11 DIAGNOSIS — F32A Depression, unspecified: Secondary | ICD-10-CM

## 2015-06-11 DIAGNOSIS — F329 Major depressive disorder, single episode, unspecified: Secondary | ICD-10-CM

## 2015-06-11 MED ORDER — KETOROLAC TROMETHAMINE 30 MG/ML IJ SOLN: INTRAMUSCULAR | Status: DC

## 2015-06-11 MED ORDER — ARIPIPRAZOLE 5 MG PO TABS: 5.0000 mg | ORAL_TABLET | Freq: Every day | ORAL | Status: DC

## 2015-06-11 MED ORDER — SUMATRIPTAN SUCCINATE 100 MG PO TABS: 100.0000 mg | ORAL_TABLET | Freq: Once | ORAL | Status: DC | PRN

## 2015-06-11 MED ORDER — TOPIRAMATE 100 MG PO TABS: 100.0000 mg | ORAL_TABLET | Freq: Every day | ORAL | Status: DC

## 2015-06-11 NOTE — Progress Notes (Signed)
[facility]  PATIENT: Anita Black DOB: 1980-08-15     HISTORICAL  CHIEF COMPLAINT:  Chief Complaint  Patient presents with  . Multiple Sclerosis    Sts. she continues to tolerate Tysabri well.  JCV ab last checked 11-20-14 and was negative at 0.07.  She is here today with c/o worsening h/a's.  Sts. she has a h/a at least 6 days per week.  Sts. she has about 6 migraines per month,  Her pcp has been rx'ing meds.  Sts. when she has Maxalt she uses it at least every other day.  Sts. she also uses Zomig tablets at least every other day.  She doesn't think either Maxalt or Zomig help any more.  Would like to discuss a daily preventative--has tried Atenolo  . Migraines    in the past without relief/fim    HISTORY OF PRESENT ILLNESS:   Anita Black is a 35 year old woman with MS experiencing worsening headaches.   .  MS:   She is on Tysabri 300 mg IV monthly and tolerates it well. She has not had any recent MS exacerbation. Her JCV antibody was checked 6 months ago and was negative at 0.07.    No new symptoms  Headaches/neck pain:      She is getting more and more frequent migraines and the triptans seem less effective.    They now occur 6 days a week. This more constant pain is also noted in the neck/occiput. She has 24/30 headache days a month for > 4 hours a day.     About 2 times a week the headache is more severe sand she gets Nausea and vomiting.    If the triptan does not help, she tries IM toradol and does well for at least one day but then the headache returns.   When very severe, she goes to urgent care.   In the past,  she has tried atenolol without benefit.   She tried [name] without benefit.   She has not tried topiramate or Keppra.     Gait/strength/sensation: She feels gait is doing well --- just occasionally slightly off balanced.   Strength is good.   She has some dysesthetic leg pain.     Bladder/bowel: She denies any bladder dysfunction.   She has  bowel incontinence 1-2 times monthly  She can't get to the bathroom in times when it occurs.  Vision: She has had optic neuritis involving each eye. When tired, vision seems more blurry.  VA is 20/30 out of either eye.   She has not had diplopia.  Fatigue/sleep: Fatigue is helped by stimulant.    Adderall helped but insurance would not cover and she started vyvanse last week.   It seems to help. Sleep onset is easy but she has sleep maintenance issues and sometimes takes Xanax.    She snores but has never been noted to have pauses in her breathing or gasping.  Mood/social:   Mood is doing better on combination of Trintellix and [name].   She has less agitaiton since [name] was added.         Cognition: Cognition has been a problem .   She notes reduced STM and focus is reduced.  . She also  notes difficulty with verbal fluency.   MS History:  She was diagnosed with multiple sclerosis in 2009 after presenting with numbness in the arms and torso, primarily in the front of her body. An MRI of the brain was normal  but an MRI of the spinal cord showed 2 cervical plaques. She was started on Rebif but had difficulty tolerating the medication and after about a year switched to Copaxone. Unfortunately, she also had trouble tolerating Copaxone. She then was on Copaxone and had injeciotn reactions and switched to Avonex.  After her pregnanacy, she was switched to Gilenya. However, she had bradycardia and that was discontinued. In January 2015 she started Tysabri and continues on that medication. [place]. She continues her Tysabri treatments here.     She is JCV antibody negative. She feels better with the Tysabri infusions in general. However, she gets nausea that lasts several days after each infusion.   MRI Brain from 07/01/14 shows about 10-12 hyperintense foci consistent with MS. No lesions were seen in the posterior fossa.    ROS:  Out of a complete 14 system review of  symptoms, the patient complains only of the following symptoms, and all other reviewed systems are negative.   She notes fatigue and depression.   She has headaches, anddifficulty with attention, Snoring, rare bowel incontinence   ALLERGIES: No Known Allergies  HOME MEDICATIONS:  Current outpatient prescriptions:  .  alprazolam (XANAX) 2 MG tablet, Take 2 mg by mouth 3 (three) times daily as needed. , Disp: , Rfl:  .  ARIPiprazole ([name]) 5 MG tablet, Take 1 tablet (5 mg total) by mouth daily., Disp: 30 tablet, Rfl: 5 .  glycopyrrolate (ROBINUL) 2 MG tablet, Take 2 mg by mouth 2 (two) times daily. , Disp: , Rfl:  .  ketorolac (TORADOL) 30 MG/ML injection, One cc IM prn migraine.  No more than 2/day Dispense with 8 (eight)    3cc syringes with 1 inch 25g needles, Disp: 8 mL, Rfl: 0 .  lisdexamfetamine ([name]) 60 MG capsule, Take 1 capsule (60 mg total) by mouth every morning., Disp: 30 capsule, Rfl: 0 .  natalizumab (TYSABRI) 300 MG/15ML injection, Inject into the vein. Every 28 days, Disp: , Rfl:  .  promethazine (PHENERGAN) 25 MG tablet, Take 1 tablet (25 mg total) by mouth every 6 (six) hours as needed for nausea or vomiting., Disp: 12 tablet, Rfl: 0 .  rizatriptan (MAXALT-MLT) 10 MG disintegrating tablet, Place 10 mg under the tongue as needed. , Disp: , Rfl:  .  SPRINTEC 28 0.25-35 MG-MCG tablet, Take 1 tablet by mouth daily., Disp: , Rfl:  .  TRINTELLIX 20 MG TABS, Take 1 tablet by mouth daily., Disp: , Rfl:  .  zolmitriptan (ZOMIG-ZMT) 5 MG disintegrating tablet, Take 1 tablet by mouth as needed., Disp: , Rfl:  .  fluticasone (FLONASE) 50 MCG/ACT nasal spray, Reported on 06/11/2015, Disp: , Rfl:  .  metFORMIN (GLUCOPHAGE) 1000 MG tablet, Take 1,000 mg by mouth 2 (two) times daily with a meal. Reported on 06/11/2015, Disp: , Rfl:  .  VENTOLIN HFA 108 (90 BASE) MCG/ACT inhaler, Reported on 06/11/2015, Disp: , Rfl:   PAST MEDICAL HISTORY: Past Medical History  Diagnosis Date  . Multiple  sclerosis (HCC)     receives Tysabri infusion q 28 days  . Diabetes type 2, controlled (HCC)   . Migraine   . Anxiety   . Depression   . Hyperlipidemia   . Elevated hemoglobin A1c     PAST SURGICAL HISTORY: Past Surgical History  Procedure Laterality Date  . Cholecystectomy    . Cesarean section    . Abdominoplasty      FAMILY HISTORY: Family History  Problem Relation Age of Onset  . Diabetes Mother   . Hypertension Mother   . Dementia Mother   . Bipolar disorder Mother   . Heart failure Father   . Hypertension Father   . Heart disease Father   . Ovarian cancer Maternal Grandmother   . Alzheimer's disease Paternal Grandmother   . Skin cancer Maternal Grandfather     SOCIAL HISTORY:  Social History   Social History  . Marital Status: Married    Spouse Name: Rollen Sox  . Number of Children: 1  . Years of Education: MA   Occupational History  .  [employer]   Social History Main Topics  . Smoking status: Never Smoker   . Smokeless tobacco: Never Used  . Alcohol Use: Yes     Comment: socially; once a week  . Drug Use: No  . Sexual Activity: Yes   Other Topics Concern  . Not on file   Social History Narrative   Patient lives at home with family.   Caffeine Use: 1-2 cups daily   No tobacco, recreational drugs. Social drinking only.   Wears her seatbelt, has smoke alarm in the home.   Feels safe in her relationships.     PHYSICAL EXAM  Filed Vitals:   06/11/15 1419  BP: 120/88  Pulse: 86  Resp: 16  Height: 6\' 1"  (1.854 m)  Weight: 259 lb (117.482 kg)    Body mass index is 34.18 kg/(m^2).   General: The patient is well-developed and well-nourished and in no acute distress  Neck:   She is very tender at the occiput bilaterally over the splenius capitis muscles, left more than right.  Neurologic Exam  Mental status: The patient is alert and oriented x 3 at the time of the examination. The patient has apparent normal recent and remote  memory, with an apparently normal attention span and concentration ability.   Speech is normal.  Cranial nerves: Extraocular movements are full.  Facial symmetry is present. There is good facial sensation to soft touch bilaterally.Facial strength is normal.  Trapezius and sternocleidomastoid strength is normal. No dysarthria is noted. No obvious hearing deficits are noted.  Motor:  Muscle bulk is normal.   Tone is normal. Strength is  5 / 5 in all 4 extremities.   Sensory: Sensory testing is intact to pinprick, soft touch and vibration sensation in all 4 extremities.  Coordination: Cerebellar testing reveals good finger-nose-finger and heel-to-shin bilaterally.  Gait and station: Station is normal.   Gait is normal today. Tandem gait is mildly wide. Romberg is negative.   Reflexes: Deep tendon reflexes are symmetric and 1+ bilaterally.     DIAGNOSTIC DATA (LABS, IMAGING, TESTING) - I reviewed patient records, labs, notes, testing and imaging myself where available.  Lab Results  Component Value Date   WBC 6.9 05/01/2015   HGB 12.1 05/01/2015   HCT 36.8 05/01/2015   MCV 82.0 05/01/2015   PLT 286.0 05/01/2015      Component Value Date/Time   NA 137 05/01/2015 0949   NA 137 04/15/2014 1554   K 4.4 05/01/2015 0949   CL 104 05/01/2015 0949   CO2 25 05/01/2015 0949   GLUCOSE 108* 05/01/2015 0949   GLUCOSE 94 04/15/2014 1554   BUN 14 05/01/2015 0949   BUN 15 04/15/2014 1554   CREATININE 0.67 05/01/2015 0949   CALCIUM 9.5 05/01/2015 0949   PROT 6.8 05/01/2015 0949   PROT 6.8 05/27/2014 1624   ALBUMIN 4.2 05/01/2015 0949  ALBUMIN 4.2 05/27/2014 1624   AST 10 05/01/2015 0949   ALT 9 05/01/2015 0949   ALKPHOS 100 05/01/2015 0949   BILITOT 0.8 05/01/2015 0949   GFRNONAA 102 04/15/2014 1554   GFRAA 117 04/15/2014 1554       ASSESSMENT AND PLAN  MS (multiple sclerosis) (HCC)  Numbness  Other fatigue  Common migraine without intractability    1.  Continue Tysabri  for her multiple sclerosis.    Check CBC and Stratify JCV Ab 2.  Continue Trintellix and [name] 5 mg.  if depression worsens, she needs to go back to see psychiatry. 3.  Splenius Capitis trigger point injections bilaterally with 80 mg Depo-Medrol in [place] using sterile technique.     She felt better a few minutes later. 4   refill. Switch to sumatriptan from Maxalt.  Return to clinic in 4 months for a regular follow-up visit or call sooner if new or worsening neurologic symptoms.  Deissy Guilbert A. Epimenio Foot, MD, PhD 06/11/2015, 2:33 PM Certified in Neurology, Clinical Neurophysiology, Sleep Medicine, Pain Medicine and Neuroimaging  [facility] [address], [address] [place], [place] [address] [phone]

## 2015-06-12 LAB — CBC WITH DIFFERENTIAL/PLATELET
BASOS ABS: 0 10*3/uL (ref 0.0–0.2)
BASOS: 0 %
EOS (ABSOLUTE): 0.1 10*3/uL (ref 0.0–0.4)
Eos: 1 %
Hematocrit: 34.5 % (ref 34.0–46.6)
Hemoglobin: 11.7 g/dL (ref 11.1–15.9)
IMMATURE GRANS (ABS): 0 10*3/uL (ref 0.0–0.1)
IMMATURE GRANULOCYTES: 0 %
LYMPHS: 45 %
Lymphocytes Absolute: 3.4 10*3/uL — ABNORMAL HIGH (ref 0.7–3.1)
MCH: 26.7 pg (ref 26.6–33.0)
MCHC: 33.9 g/dL (ref 31.5–35.7)
MCV: 79 fL (ref 79–97)
Monocytes Absolute: 0.4 10*3/uL (ref 0.1–0.9)
Monocytes: 5 %
NEUTROS PCT: 49 %
Neutrophils Absolute: 3.7 10*3/uL (ref 1.4–7.0)
PLATELETS: 252 10*3/uL (ref 150–379)
RBC: 4.38 x10E6/uL (ref 3.77–5.28)
RDW: 13.8 % (ref 12.3–15.4)
WBC: 7.6 10*3/uL (ref 3.4–10.8)

## 2015-06-16 ENCOUNTER — Encounter: Payer: Self-pay | Admitting: *Deleted

## 2015-06-18 ENCOUNTER — Ambulatory Visit (INDEPENDENT_AMBULATORY_CARE_PROVIDER_SITE_OTHER): Payer: Self-pay

## 2015-06-18 DIAGNOSIS — G35 Multiple sclerosis: Secondary | ICD-10-CM

## 2015-06-18 DIAGNOSIS — R200 Anesthesia of skin: Secondary | ICD-10-CM | POA: Diagnosis not present

## 2015-06-18 DIAGNOSIS — Z0289 Encounter for other administrative examinations: Secondary | ICD-10-CM

## 2015-06-19 ENCOUNTER — Other Ambulatory Visit: Payer: Self-pay | Admitting: *Deleted

## 2015-06-19 MED ORDER — LISDEXAMFETAMINE DIMESYLATE 60 MG PO CAPS: 60.0000 mg | ORAL_CAPSULE | ORAL | Status: DC

## 2015-06-19 NOTE — Telephone Encounter (Signed)
[name] rx. r/f at infusion appt/fim

## 2015-06-20 ENCOUNTER — Telehealth: Payer: Self-pay | Admitting: *Deleted

## 2015-06-20 NOTE — Telephone Encounter (Signed)
-----   Message from Asa Lente, MD sent at 06/19/2015  7:32 PM EST ----- Please note that the MRI of the brain does not show any new lesions compared to her prior one.

## 2015-06-20 NOTE — Telephone Encounter (Signed)
PC with pt. and per RAS, advised mri brain does not show any new lesions when compared to prior mri.  She verbalized understanding of same/fim

## 2015-06-20 NOTE — Telephone Encounter (Signed)
-----   Message from [name] Sater, MD sent at 06/19/2015  7:32 PM EST ----- Please note that the MRI of the brain does not show any new lesions compared to her prior one. 

## 2015-07-07 ENCOUNTER — Telehealth: Payer: Self-pay | Admitting: Family Medicine

## 2015-07-07 NOTE — Telephone Encounter (Signed)
I am not certain who was prescribing this medication prior and for what reason? Likely will need to discuss, if this was prescribed by neurology, she may just need to ask them for refills.

## 2015-07-07 NOTE — Telephone Encounter (Signed)
Spoke with patient scheduled appt for evaluation . 

## 2015-07-07 NOTE — Telephone Encounter (Signed)
Pt states that she has moved and forgot to ask for a refill on robinul, Pt states that if she needs an appt first to let her know and she will come in, [facility]

## 2015-07-09 ENCOUNTER — Encounter: Payer: Self-pay | Admitting: Family Medicine

## 2015-07-09 ENCOUNTER — Ambulatory Visit (INDEPENDENT_AMBULATORY_CARE_PROVIDER_SITE_OTHER): Payer: BC Managed Care – PPO | Admitting: Family Medicine

## 2015-07-09 VITALS — BP 119/79 | HR 90 | Temp 98.1°F | Resp 20 | Wt 259.2 lb

## 2015-07-09 DIAGNOSIS — E119 Type 2 diabetes mellitus without complications: Secondary | ICD-10-CM

## 2015-07-09 DIAGNOSIS — Z79899 Other long term (current) drug therapy: Secondary | ICD-10-CM

## 2015-07-09 DIAGNOSIS — F32A Depression, unspecified: Secondary | ICD-10-CM

## 2015-07-09 DIAGNOSIS — L74511 Primary focal hyperhidrosis, face: Secondary | ICD-10-CM

## 2015-07-09 DIAGNOSIS — Z13220 Encounter for screening for lipoid disorders: Secondary | ICD-10-CM | POA: Diagnosis not present

## 2015-07-09 DIAGNOSIS — F329 Major depressive disorder, single episode, unspecified: Secondary | ICD-10-CM

## 2015-07-09 DIAGNOSIS — Z6834 Body mass index (BMI) 34.0-34.9, adult: Secondary | ICD-10-CM

## 2015-07-09 MED ORDER — TRINTELLIX 20 MG PO TABS: 20.0000 mg | ORAL_TABLET | Freq: Every day | ORAL | Status: DC

## 2015-07-09 MED ORDER — GLYCOPYRROLATE 2 MG PO TABS: 2.0000 mg | ORAL_TABLET | Freq: Two times a day (BID) | ORAL | Status: DC

## 2015-07-09 NOTE — Progress Notes (Signed)
Patient ID: Anita Black, female   DOB: 03-29-1981, 35 y.o.   MRN: [id number]    Anita Black , 14-May-1980, 35 y.o., female MRN: [id number]  CC: medication request/refills Subjective:  Hyperhidrosis of face Pt has had a long standing h/o excessive sweating, especially around the face, neck and arms. Her prior PCP had started her on Robinul and patient has continued medication. She states she is on the last few days of her medication. She has felt the medication has been "life changing". She denies any side effects.   Depression Pt has been suffering from major depression for a couple of years. She states she always had some mild anxietyand depression, but a few years ago she "got really bad". She was tried on trintellix by her PCP and it was helpful. She then felt like she needed more coverage and her neurologist started abilify. She does admit to weight gain since starting [name]. She has not had her cholesterol monitored. She is on metformin for diabetes type 2. She is requesting refills today.   No Known Allergies Social History  Substance Use Topics  . Smoking status: Never Smoker   . Smokeless tobacco: Never Used  . Alcohol Use: Yes     Comment: socially; once a week   Past Medical History  Diagnosis Date  . Multiple sclerosis (HCC)     receives Tysabri infusion q 28 days  . Diabetes type 2, controlled (HCC)   . Migraine   . Anxiety   . Depression   . Hyperlipidemia   . Elevated hemoglobin A1c    Past Surgical History  Procedure Laterality Date  . Cholecystectomy    . Cesarean section    . Abdominoplasty     Family History  Problem Relation Age of Onset  . Diabetes Mother   . Hypertension Mother   . Dementia Mother   . Bipolar disorder Mother   . Heart failure Father   . Hypertension Father   . Heart disease Father   . Ovarian cancer Maternal Grandmother   . Alzheimer's disease Paternal Grandmother   . Skin cancer Maternal Grandfather      Medication  List       This list is accurate as of: 07/09/15  4:06 PM.  Always use your most recent med list.               alprazolam 2 MG tablet  Commonly known as:  XANAX  Take 2 mg by mouth 3 (three) times daily as needed.     ARIPiprazole 5 MG tablet  Commonly known as:  [name]  Take 1 tablet (5 mg total) by mouth daily.     BD SAFETYGLIDE SHIELDED NEEDLE 25G X 1" Misc  Generic drug:  NEEDLE (DISP) 25 G  See admin instructions.     fluticasone 50 MCG/ACT nasal spray  Commonly known as:  FLONASE  Reported on 06/11/2015     glycopyrrolate 2 MG tablet  Commonly known as:  ROBINUL  Take 2 mg by mouth 2 (two) times daily.     ketorolac 30 MG/ML injection  Commonly known as:  TORADOL  One cc IM prn migraine.  No more than 2/day Dispense with 8 (eight)    3cc syringes with 1 inch 25g needles     lisdexamfetamine 60 MG capsule  Commonly known as:  [name]  Take 1 capsule (60 mg total) by mouth every morning.     metFORMIN 1000 MG tablet  Commonly known as:  GLUCOPHAGE  Take 1,000 mg by mouth 2 (two) times daily with a meal. Reported on 06/11/2015     natalizumab 300 MG/15ML injection  Commonly known as:  TYSABRI  Inject into the vein. Every 28 days     promethazine 25 MG tablet  Commonly known as:  PHENERGAN  Take 1 tablet (25 mg total) by mouth every 6 (six) hours as needed for nausea or vomiting.     SPRINTEC 28 0.25-35 MG-MCG tablet  Generic drug:  norgestimate-ethinyl estradiol  Take 1 tablet by mouth daily.     SUMAtriptan 100 MG tablet  Commonly known as:  IMITREX  Take 1 tablet (100 mg total) by mouth once as needed for migraine. May repeat in 2 hours if headache persists or recurs.     topiramate 100 MG tablet  Commonly known as:  TOPAMAX  Take 1 tablet (100 mg total) by mouth daily.     TRINTELLIX 20 MG Tabs  Generic drug:  Vortioxetine HBr  Take 1 tablet by mouth daily.     VENTOLIN HFA 108 (90 Base) MCG/ACT inhaler  Generic drug:  albuterol  Reported on  06/11/2015         ROS: Negative, with the exception of above mentioned in HPI   Objective:  BP 119/79 mmHg  Pulse 90  Temp(Src) 98.1 F (36.7 C)  Resp 20  Wt 259 lb 4 oz (117.595 kg)  SpO2 96%  LMP 06/30/2015 Body mass index is 34.21 kg/(m^2). Gen: Afebrile. No acute distress. Nontoxic in appearance. Well developed.well nourished female. Pleasant.  HENT: AT. [place]. MMM, no oral lesions.  Eyes:Pupils Equal Round Reactive to light, Extraocular movements intact,  Conjunctiva without redness, discharge or icterus. CV: RRR , Chest: CTAB, no wheeze or crackles. Good air movement, normal resp effort.  Skin: No rashes, purpura or petechiae.  Neuro: Normal gait. PERLA. EOMi. Alert. Oriented x3  Psych: Normal affect, dress and demeanor. Normal speech. Normal thought content and judgment. Tearful at times.  Assessment/Plan: Anita Black is a 35 y.o. female present for acute OV for  1. Controlled type 2 diabetes mellitus without complication, without long-term current use of insulin (HCC) - stable - continue metformin  - discussed need for retest in June, pt will have collected with lipids prior to appt  - HgB A1c; Future  2. Hyperhidrosis of face - stable.  - refills for 1 year on Robinul. Cautioned pt on headache SE considering her chronic headaches.  - pt has bene tolerating for a couple years.   3. Depression - stable. - Refills for 6 months on trintellix.   4. [name] use/cholesterol screen/ BMI 34: - lipid panel future    electronically signed by:  Felix Pacini, DO  [facility] [place]

## 2015-07-09 NOTE — Patient Instructions (Signed)
Refilled medications today F/u in June with labs collected a few days before appt

## 2015-07-14 ENCOUNTER — Telehealth: Payer: Self-pay | Admitting: Family Medicine

## 2015-07-14 ENCOUNTER — Encounter: Payer: Self-pay | Admitting: Family Medicine

## 2015-07-14 ENCOUNTER — Ambulatory Visit (INDEPENDENT_AMBULATORY_CARE_PROVIDER_SITE_OTHER): Payer: BC Managed Care – PPO | Admitting: Family Medicine

## 2015-07-14 VITALS — BP 108/78 | HR 84 | Temp 97.9°F | Resp 20 | Wt 259.0 lb

## 2015-07-14 DIAGNOSIS — R350 Frequency of micturition: Secondary | ICD-10-CM | POA: Diagnosis not present

## 2015-07-14 DIAGNOSIS — R8290 Unspecified abnormal findings in urine: Secondary | ICD-10-CM

## 2015-07-14 DIAGNOSIS — R319 Hematuria, unspecified: Secondary | ICD-10-CM | POA: Diagnosis not present

## 2015-07-14 DIAGNOSIS — N390 Urinary tract infection, site not specified: Secondary | ICD-10-CM | POA: Diagnosis not present

## 2015-07-14 LAB — POC URINALSYSI DIPSTICK (AUTOMATED)
Blood, UA: NEGATIVE
Nitrite, UA: POSITIVE
PH UA: 5.5
Spec Grav, UA: 1.025
Urobilinogen, UA: 1

## 2015-07-14 MED ORDER — PHENAZOPYRIDINE HCL 100 MG PO TABS: ORAL_TABLET | ORAL | Status: DC

## 2015-07-14 MED ORDER — FLUCONAZOLE 150 MG PO TABS: 150.0000 mg | ORAL_TABLET | Freq: Once | ORAL | Status: DC

## 2015-07-14 MED ORDER — CEPHALEXIN 500 MG PO CAPS: 500.0000 mg | ORAL_CAPSULE | Freq: Four times a day (QID) | ORAL | Status: DC

## 2015-07-14 NOTE — Progress Notes (Signed)
Patient ID: Anita Black, female   DOB: Sep 26, 1980, 35 y.o.   MRN: [id number]    Anita Black , 1980/09/28, 35 y.o., female MRN: [id number]  CC: dysuria Subjective: Pt presents for an acute OV with complaints of dysuria of 4 days duration. Associated symptoms include suprapubic pain, urinary frequency, dysuria and nausea.  Pt has tried azo to ease their symptoms. She denies vomit, low back pain.    No Known Allergies Social History  Substance Use Topics  . Smoking status: Never Smoker   . Smokeless tobacco: Never Used  . Alcohol Use: Yes     Comment: socially; once a week   Past Medical History  Diagnosis Date  . Multiple sclerosis (HCC)     receives Tysabri infusion q 28 days  . Diabetes type 2, controlled (HCC)   . Migraine   . Anxiety   . Depression   . Hyperlipidemia   . Elevated hemoglobin A1c    Past Surgical History  Procedure Laterality Date  . Cholecystectomy    . Cesarean section    . Abdominoplasty     Family History  Problem Relation Age of Onset  . Diabetes Mother   . Hypertension Mother   . Dementia Mother   . Bipolar disorder Mother   . Heart failure Father   . Hypertension Father   . Heart disease Father   . Ovarian cancer Maternal Grandmother   . Alzheimer's disease Paternal Grandmother   . Skin cancer Maternal Grandfather      Medication List       This list is accurate as of: 07/14/15  4:28 PM.  Always use your most recent med list.               alprazolam 2 MG tablet  Commonly known as:  XANAX  Take 2 mg by mouth 3 (three) times daily as needed.     ARIPiprazole 5 MG tablet  Commonly known as:  [name]  Take 1 tablet (5 mg total) by mouth daily.     BD SAFETYGLIDE SHIELDED NEEDLE 25G X 1" Misc  Generic drug:  NEEDLE (DISP) 25 G  See admin instructions.     fluticasone 50 MCG/ACT nasal spray  Commonly known as:  FLONASE  Reported on 06/11/2015     glycopyrrolate 2 MG tablet  Commonly known as:  ROBINUL  Take 1 tablet (2  mg total) by mouth 2 (two) times daily.     ketorolac 30 MG/ML injection  Commonly known as:  TORADOL  One cc IM prn migraine.  No more than 2/day Dispense with 8 (eight)    3cc syringes with 1 inch 25g needles     lisdexamfetamine 60 MG capsule  Commonly known as:  [name]  Take 1 capsule (60 mg total) by mouth every morning.     metFORMIN 1000 MG tablet  Commonly known as:  GLUCOPHAGE  Take 1,000 mg by mouth 2 (two) times daily with a meal. Reported on 06/11/2015     natalizumab 300 MG/15ML injection  Commonly known as:  TYSABRI  Inject into the vein. Every 28 days     promethazine 25 MG tablet  Commonly known as:  PHENERGAN  Take 1 tablet (25 mg total) by mouth every 6 (six) hours as needed for nausea or vomiting.     SPRINTEC 28 0.25-35 MG-MCG tablet  Generic drug:  norgestimate-ethinyl estradiol  Take 1 tablet by mouth daily.     SUMAtriptan 100 MG tablet  Commonly  known as:  IMITREX  Take 1 tablet (100 mg total) by mouth once as needed for migraine. May repeat in 2 hours if headache persists or recurs.     topiramate 100 MG tablet  Commonly known as:  TOPAMAX  Take 1 tablet (100 mg total) by mouth daily.     TRINTELLIX 20 MG Tabs  Generic drug:  Vortioxetine HBr  Take 20 mg by mouth daily.     VENTOLIN HFA 108 (90 Base) MCG/ACT inhaler  Generic drug:  albuterol  Reported on 06/11/2015       ROS: Negative, with the exception of above mentioned in HPI  Objective:  BP 108/78 mmHg  Pulse 84  Temp(Src) 97.9 F (36.6 C)  Resp 20  Wt 259 lb (117.482 kg)  SpO2 96%  LMP 06/30/2015 Body mass index is 34.18 kg/(m^2). Gen: Afebrile. No acute distress. Nontoxic in appearance. Well developed, well nourished. Female.  HENT: AT. [place].  MMM, no oral lesions.  Eyes:Pupils Equal Round Reactive to light, Extraocular movements intact,  Conjunctiva without redness, discharge or icterus. Abd: Soft.obese. ND. Suprapubic pressure. BS present. MSK: No CVA tenderness. .  Neuro:  Normal gait. PERLA. EOMi. Alert. Oriented x3  Assessment/Plan: Anita Black is a 35 y.o. female present for acute OV for Urinary frequency - POCT Urinalysis Dipstick (Automated)- Positive nitirties, leuks, blood, ketones.  - Urine Culture  Urinary tract infection with hematuria, site unspecified - pyridium 200 mg x 12 tabs.  200 mg BID PRN with meals for 2-3 days only. - diflucan in the event of yeast with abx use.  - cephALEXin (KEFLEX) 500 MG capsule; Take 1 capsule (500 mg total) by mouth 4 (four) times daily.  Dispense: 28 capsule; Refill: 0   electronically signed by:  Felix Pacini, DO  [facility] [place]

## 2015-07-14 NOTE — Patient Instructions (Signed)

## 2015-07-14 NOTE — Telephone Encounter (Signed)
Pt will need an appt.  - Home test are not accurate and AZO use will askew those results. We will need to see her and collect urine for a culture.

## 2015-07-14 NOTE — Telephone Encounter (Signed)
Patient took a UTI  home test that came back positive. She has been taking AZO but it doesn't seem to be helping. Can an Rx be sent to [facility]?

## 2015-07-14 NOTE — Telephone Encounter (Signed)
Left detailed message per Dr. Alan Ripper note. Patient will need to schedule an appt.

## 2015-07-15 ENCOUNTER — Ambulatory Visit: Payer: BC Managed Care – PPO | Admitting: Neurology

## 2015-07-15 ENCOUNTER — Ambulatory Visit: Payer: BC Managed Care – PPO | Admitting: Family Medicine

## 2015-07-16 ENCOUNTER — Telehealth: Payer: Self-pay | Admitting: Family Medicine

## 2015-07-16 NOTE — Telephone Encounter (Signed)
Please call patient, her urine culture did grow out Escherichia coli infection. The medication she is prescribed should cover infection

## 2015-07-16 NOTE — Telephone Encounter (Signed)
Left detailed message.   

## 2015-07-16 NOTE — Telephone Encounter (Signed)
N/V and pain can be symptoms of worsening UTI. Keflex is usually well tolerated, but she could get a side effect of N/V.  Pain is not usually associated with a reaction to a medicine, this could mean worsening infection, especially if  Lower back/flank area. Make sure she is taking medicine with food. If she is not seeing an improvement or is worsening, I  would want to consider another abx vs concern for pyelonephritis (kidney infection), which can be an emergency and need IV abx/kidney images.

## 2015-07-16 NOTE — Telephone Encounter (Signed)
Patient called c/o of pain and N&V. Explained Dr. Alan Ripper previous note. Patient would like to know if it is possible she is allergic to the antibiotic that was given to her.

## 2015-07-17 LAB — URINE CULTURE: Colony Count: >=100000

## 2015-07-23 ENCOUNTER — Other Ambulatory Visit: Payer: Self-pay | Admitting: *Deleted

## 2015-07-23 MED ORDER — LISDEXAMFETAMINE DIMESYLATE 60 MG PO CAPS: 60.0000 mg | ORAL_CAPSULE | ORAL | Status: DC

## 2015-07-23 NOTE — Telephone Encounter (Signed)
[name] r/f at infusion appt/fim

## 2015-08-18 ENCOUNTER — Other Ambulatory Visit: Payer: Self-pay | Admitting: *Deleted

## 2015-08-18 MED ORDER — LISDEXAMFETAMINE DIMESYLATE 60 MG PO CAPS: 60.0000 mg | ORAL_CAPSULE | ORAL | Status: DC

## 2015-08-18 NOTE — Telephone Encounter (Signed)
[name] rx. provided at infusion appt/fim

## 2015-08-27 ENCOUNTER — Encounter: Payer: Self-pay | Admitting: Neurology

## 2015-08-28 ENCOUNTER — Telehealth: Payer: Self-pay | Admitting: Neurology

## 2015-08-28 ENCOUNTER — Ambulatory Visit (INDEPENDENT_AMBULATORY_CARE_PROVIDER_SITE_OTHER): Payer: BC Managed Care – PPO | Admitting: Neurology

## 2015-08-28 ENCOUNTER — Encounter: Payer: Self-pay | Admitting: Neurology

## 2015-08-28 VITALS — BP 120/92 | HR 68 | Resp 16 | Ht 73.0 in | Wt 259.0 lb

## 2015-08-28 DIAGNOSIS — M542 Cervicalgia: Secondary | ICD-10-CM | POA: Diagnosis not present

## 2015-08-28 DIAGNOSIS — R200 Anesthesia of skin: Secondary | ICD-10-CM | POA: Diagnosis not present

## 2015-08-28 DIAGNOSIS — G35 Multiple sclerosis: Secondary | ICD-10-CM

## 2015-08-28 DIAGNOSIS — F32A Depression, unspecified: Secondary | ICD-10-CM

## 2015-08-28 DIAGNOSIS — G43009 Migraine without aura, not intractable, without status migrainosus: Secondary | ICD-10-CM | POA: Diagnosis not present

## 2015-08-28 DIAGNOSIS — F329 Major depressive disorder, single episode, unspecified: Secondary | ICD-10-CM | POA: Diagnosis not present

## 2015-08-28 MED ORDER — TOPIRAMATE 100 MG PO TABS: 100.0000 mg | ORAL_TABLET | Freq: Two times a day (BID) | ORAL | Status: DC

## 2015-08-28 NOTE — Progress Notes (Signed)
[facility]  PATIENT: Anita Black DOB: 01-04-1981     HISTORICAL  CHIEF COMPLAINT:  Chief Complaint  Patient presents with  . Multiple Sclerosis    Here today with c/o persistent h/a, onset 08-24-15.  Minimal relief with Imitrex, Maxalt, IM Toradol, Lortab, otc meds. ONB has helped in the past./fim  . Headache    HISTORY OF PRESENT ILLNESS:   Anita Black is a 35 year old woman with MS experiencing worsening headaches.   .  MS:   She is on Tysabri 300 mg IV monthly and tolerates it well. She has not had any recent MS exacerbation. Her JCV antibody was checked 2 months ago and was negative at 0.10.    No new symptoms  Headaches/neck pain:      She had 6-7 weeks without a headache after the splenius capitus injection/occipitla nerve block in early February.   However, they started to return and are now starting to get bad again.   Current HA has lasted several days.     When more severe, pain is associated with Nausea and vomiting.    Imitrex has not helped.    IM toradol will help x 1 day.       In the past,  she has tried atenolol without benefit.   She tried [name] without benefit.   Topamax 100 mg is well tolerated and it may be helping as her current headaches are doing better..     Gait/strength/sensation: She feels gait is doing well and balance is good most of the time.   Strength is good.   She has some dysesthetic leg pain.     Bladder/bowel: She denies any bladder dysfunction.   She has rare bowel incontinence 1-2 times monthly  She can't get to the bathroom in times when it occurs.  Vision: She has had optic neuritis involving each eye. When tired, vision seems more blurry.  VA is 20/30 out of either eye.   She has not had diplopia.  Fatigue/sleep: Fatigue is helped by [name].    Adderall helped but insurance would not cover and she started vyvanse last week.  It wears off by the time she is ome.   Slleep onset is easy and she has less insomnia  now.    She snores but has never been noted to have pauses in her breathing or gasping.  Mood/social:   Mood is doing better on combination of Trintellix and [name] but she still notes some depression and has occasional tearfulness.   She has less agitaiton since [name] was added.         Cognition: Cognition has been a problem .   She notes reduced STM and focus is reduced.  . She also  notes difficulty with verbal fluency.   MS History:  She was diagnosed with multiple sclerosis in 2009 after presenting with numbness in the arms and torso, primarily in the front of her body. An MRI of the brain was normal but an MRI of the spinal cord showed 2 cervical plaques. She was started on Rebif but had difficulty tolerating the medication and after about a year switched to Copaxone. Unfortunately, she also had trouble tolerating Copaxone. She then was on Copaxone and had injeciotn reactions and switched to Avonex.  After her pregnanacy, she was switched to Gilenya. However, she had bradycardia and that was discontinued. In January 2015 she started Tysabri and continues on that medication. [place].  She continues her Tysabri treatments here.     She is JCV antibody negative. She feels better with the Tysabri infusions in general. However, she gets nausea that lasts several days after each infusion.   MRI Brain from 07/01/14 shows about 10-12 hyperintense foci consistent with MS. No lesions were seen in the posterior fossa.    ROS:  Out of a complete 14 system review of symptoms, the patient complains only of the following symptoms, and all other reviewed systems are negative.   She notes fatigue and depression.   She has headaches, anddifficulty with attention, Snoring, rare bowel incontinence   ALLERGIES: No Known Allergies  HOME MEDICATIONS:  Current outpatient prescriptions:  .  alprazolam (XANAX) 2 MG tablet, Take 2 mg by mouth 3 (three) times daily as needed. ,  Disp: , Rfl:  .  ARIPiprazole ([name]) 5 MG tablet, Take 1 tablet (5 mg total) by mouth daily., Disp: 30 tablet, Rfl: 5 .  BD SAFETYGLIDE SHIELDED NEEDLE 25G X 1" MISC, See admin instructions., Disp: , Rfl: 11 .  glycopyrrolate (ROBINUL) 2 MG tablet, Take 1 tablet (2 mg total) by mouth 2 (two) times daily., Disp: 60 tablet, Rfl: 11 .  ketorolac (TORADOL) 30 MG/ML injection, One cc IM prn migraine.  No more than 2/day Dispense with 8 (eight)    3cc syringes with 1 inch 25g needles, Disp: 8 mL, Rfl: 11 .  lisdexamfetamine ([name]) 60 MG capsule, Take 1 capsule (60 mg total) by mouth every morning., Disp: 30 capsule, Rfl: 0 .  metFORMIN (GLUCOPHAGE) 1000 MG tablet, Take 1,000 mg by mouth 2 (two) times daily with a meal. Reported on 06/11/2015, Disp: , Rfl:  .  natalizumab (TYSABRI) 300 MG/15ML injection, Inject into the vein. Every 28 days, Disp: , Rfl:  .  promethazine (PHENERGAN) 25 MG tablet, Take 1 tablet (25 mg total) by mouth every 6 (six) hours as needed for nausea or vomiting., Disp: 12 tablet, Rfl: 0 .  SPRINTEC 28 0.25-35 MG-MCG tablet, Take 1 tablet by mouth daily., Disp: , Rfl:  .  SUMAtriptan (IMITREX) 100 MG tablet, Take 1 tablet (100 mg total) by mouth once as needed for migraine. May repeat in 2 hours if headache persists or recurs., Disp: 10 tablet, Rfl: 5 .  topiramate (TOPAMAX) 100 MG tablet, Take 1 tablet (100 mg total) by mouth daily., Disp: 30 tablet, Rfl: 5 .  TRINTELLIX 20 MG TABS, Take 20 mg by mouth daily., Disp: 30 tablet, Rfl: 5  PAST MEDICAL HISTORY: Past Medical History  Diagnosis Date  . Multiple sclerosis (HCC)     receives Tysabri infusion q 28 days  . Diabetes type 2, controlled (HCC)   . Migraine   . Anxiety   . Depression   . Hyperlipidemia   . Elevated hemoglobin A1c     PAST SURGICAL HISTORY: Past Surgical History  Procedure Laterality Date  . Cholecystectomy    . Cesarean section    . Abdominoplasty      FAMILY HISTORY: Family History  Problem  Relation Age of Onset  . Diabetes Mother   . Hypertension Mother   . Dementia Mother   . Bipolar disorder Mother   . Heart failure Father   . Hypertension Father   . Heart disease Father   . Ovarian cancer Maternal Grandmother   . Alzheimer's disease Paternal Grandmother   . Skin cancer Maternal Grandfather     SOCIAL HISTORY:  Social History   Social History  . Marital Status:  Married    Spouse Name: Rollen Sox  . Number of Children: 1  . Years of Education: MA   Occupational History  .  [employer]   Social History Main Topics  . Smoking status: Never Smoker   . Smokeless tobacco: Never Used  . Alcohol Use: Yes     Comment: socially; once a week  . Drug Use: No  . Sexual Activity: Yes   Other Topics Concern  . Not on file   Social History Narrative   Patient lives at home with family.   Caffeine Use: 1-2 cups daily   No tobacco, recreational drugs. Social drinking only.   Wears her seatbelt, has smoke alarm in the home.   Feels safe in her relationships.     PHYSICAL EXAM  Filed Vitals:   08/28/15 0955  BP: 120/92  Pulse: 68  Resp: 16  Height: 6\' 1"  (1.854 m)  Weight: 259 lb (117.482 kg)    Body mass index is 34.18 kg/(m^2).   General: The patient is well-developed and well-nourished and in no acute distress  Neck:   She is very tender at the occiput bilaterally over the splenius capitis muscles, left more than right.  Neurologic Exam  Mental status: The patient is alert and oriented x 3 at the time of the examination. The patient has apparent normal recent and remote memory, with an apparently normal attention span and concentration ability.   Speech is normal.  Cranial nerves: Extraocular movements are full.  Facial symmetry is present. There is good facial sensation to soft touch bilaterally.Facial strength is normal.  Trapezius and sternocleidomastoid strength is normal. No dysarthria is noted. No obvious hearing deficits are  noted.  Motor:  Muscle bulk is normal.   Tone is normal. Strength is  5 / 5 in all 4 extremities.   Sensory: Sensory testing is intact to pinprick, soft touch and vibration sensation in all 4 extremities.  Coordination: Cerebellar testing reveals good finger-nose-finger bilaterally.  Gait and station: Station is normal.   Gait is normal today. Tandem gait is mildly wide. Romberg is negative.   Reflexes: Deep tendon reflexes are symmetric and 1+ bilaterally.     DIAGNOSTIC DATA (LABS, IMAGING, TESTING) - I reviewed patient records, labs, notes, testing and imaging myself where available.  Lab Results  Component Value Date   WBC 7.6 06/11/2015   HGB 12.1 05/01/2015   HCT 34.5 06/11/2015   MCV 79 06/11/2015   PLT 252 06/11/2015      Component Value Date/Time   NA 137 05/01/2015 0949   NA 137 04/15/2014 1554   K 4.4 05/01/2015 0949   CL 104 05/01/2015 0949   CO2 25 05/01/2015 0949   GLUCOSE 108* 05/01/2015 0949   GLUCOSE 94 04/15/2014 1554   BUN 14 05/01/2015 0949   BUN 15 04/15/2014 1554   CREATININE 0.67 05/01/2015 0949   CALCIUM 9.5 05/01/2015 0949   PROT 6.8 05/01/2015 0949   PROT 6.8 05/27/2014 1624   ALBUMIN 4.2 05/01/2015 0949   ALBUMIN 4.2 05/27/2014 1624   AST 10 05/01/2015 0949   ALT 9 05/01/2015 0949   ALKPHOS 100 05/01/2015 0949   BILITOT 0.8 05/01/2015 0949   GFRNONAA 102 04/15/2014 1554   GFRAA 117 04/15/2014 1554       ASSESSMENT AND PLAN  MS (multiple sclerosis) (HCC)  Common migraine without intractability  Neck pain  Numbness  Depression     1.  Continue Tysabri for her multiple sclerosis.   2.  Continue Trintellix and [name] 5 mg.  if depression worsens, she needs to go back to see psychiatry. 3.  Splenius Capitis trigger point injections bilaterally with 80 mg Depo-Medrol in [place] using sterile technique.     She felt better a few minutes later. 4   Refill topirmate at higher dose  200 mg daily Return to clinic in 4 months  for a regular follow-up visit or call sooner if new or worsening neurologic symptoms.  Latasia Silberstein A. Epimenio Foot, MD, PhD 08/28/2015, 10:04 AM Certified in Neurology, Clinical Neurophysiology, Sleep Medicine, Pain Medicine and Neuroimaging  [facility] [address] [place], [place] [address] [phone]

## 2015-08-28 NOTE — Telephone Encounter (Signed)
I have spoken with Anita Black--she sts. Toradol inj. not helping this h/a.  She has had onb in the past that helped.  Appt. given this am with RAS/fim

## 2015-08-28 NOTE — Telephone Encounter (Signed)
Patient called, migraine since Monday, requests infusion with Inetta Fermo. Sent e-mail through MyChart yesterday regarding this.

## 2015-09-29 ENCOUNTER — Encounter: Payer: Self-pay | Admitting: Neurology

## 2015-09-30 ENCOUNTER — Other Ambulatory Visit: Payer: Self-pay | Admitting: *Deleted

## 2015-09-30 MED ORDER — AMPHETAMINE-DEXTROAMPHETAMINE 10 MG PO TABS: ORAL_TABLET | ORAL | Status: DC

## 2015-10-02 ENCOUNTER — Encounter: Payer: Self-pay | Admitting: *Deleted

## 2015-10-15 ENCOUNTER — Telehealth: Payer: Self-pay | Admitting: *Deleted

## 2015-10-15 MED ORDER — AMPHETAMINE-DEXTROAMPHETAMINE 10 MG PO TABS: ORAL_TABLET | ORAL | Status: DC

## 2015-10-15 MED ORDER — LISDEXAMFETAMINE DIMESYLATE 60 MG PO CAPS: 60.0000 mg | ORAL_CAPSULE | ORAL | Status: DC

## 2015-10-15 NOTE — Telephone Encounter (Signed)
Adderall and [name] rx's provided during infusion appt/fim

## 2015-10-17 ENCOUNTER — Ambulatory Visit: Payer: BC Managed Care – PPO | Admitting: Neurology

## 2015-10-28 ENCOUNTER — Ambulatory Visit (INDEPENDENT_AMBULATORY_CARE_PROVIDER_SITE_OTHER): Payer: BC Managed Care – PPO | Admitting: Family Medicine

## 2015-10-28 ENCOUNTER — Encounter: Payer: Self-pay | Admitting: Family Medicine

## 2015-10-28 VITALS — BP 104/73 | HR 84 | Temp 97.9°F | Resp 20 | Wt 249.2 lb

## 2015-10-28 DIAGNOSIS — E119 Type 2 diabetes mellitus without complications: Secondary | ICD-10-CM | POA: Diagnosis not present

## 2015-10-28 DIAGNOSIS — J069 Acute upper respiratory infection, unspecified: Secondary | ICD-10-CM | POA: Insufficient documentation

## 2015-10-28 DIAGNOSIS — L500 Allergic urticaria: Secondary | ICD-10-CM | POA: Diagnosis not present

## 2015-10-28 LAB — HEMOGLOBIN A1C: HEMOGLOBIN A1C: 7.3 % — AB (ref 4.6–6.5)

## 2015-10-28 MED ORDER — HYDROXYZINE HCL 25 MG PO TABS: 25.0000 mg | ORAL_TABLET | Freq: Two times a day (BID) | ORAL | Status: DC | PRN

## 2015-10-28 NOTE — Progress Notes (Signed)
Patient ID: Anita Black, female   DOB: [birth year], 35 y.o.   MRN: [id number]    Anita Black , 10/26/1980, 35 y.o., female MRN: [id number]  CC: rash/URI Subjective: Pt presents for an acute OV with complaints of cough of 5 days duration. Associated symptoms include sore throat, ear pain, chest tightness. Pt feels symptoms are improving. She has been taking mucinex, Nyquil and dayquil (all of which she had been taken prior). On Sunday she stated breaking out in hives. She states they are all over her, will go away and the come back in different spots. She has tried benadryl and its is not helping. She has had hives in the past, but she felt it was related to high anxiety times. They responded to xanax use. She states she does not feel she is experiencing high anxiety currently. She has not taken her xanax in sometime and would like to not restart if able to avoid. She denies nausea, vomit, diarrhea, fever or chills.   No Known Allergies  Social History  Substance Use Topics  . Smoking status: Never Smoker   . Smokeless tobacco: Never Used  . Alcohol Use: Yes     Comment: socially; once a week   Past Medical History  Diagnosis Date  . Multiple sclerosis (HCC)     receives Tysabri infusion q 28 days  . Diabetes type 2, controlled (HCC)   . Migraine   . Anxiety   . Depression   . Hyperlipidemia   . Elevated hemoglobin A1c    Past Surgical History  Procedure Laterality Date  . Cholecystectomy    . Cesarean section    . Abdominoplasty     Family History  Problem Relation Age of Onset  . Diabetes Mother   . Hypertension Mother   . Dementia Mother   . Bipolar disorder Mother   . Heart failure Father   . Hypertension Father   . Heart disease Father   . Ovarian cancer Maternal Grandmother   . Alzheimer's disease Paternal Grandmother   . Skin cancer Maternal Grandfather      Medication List       This list is accurate as of: 10/28/15  9:36 AM.  Always use your most  recent med list.               alprazolam 2 MG tablet  Commonly known as:  XANAX  Take 2 mg by mouth 3 (three) times daily as needed.     amphetamine-dextroamphetamine 10 MG tablet  Commonly known as:  ADDERALL  Take one tablet daily in the afternoon     ARIPiprazole 5 MG tablet  Commonly known as:  [name]  Take 1 tablet (5 mg total) by mouth daily.     BD SAFETYGLIDE SHIELDED NEEDLE 25G X 1" Misc  Generic drug:  NEEDLE (DISP) 25 G  See admin instructions.     glycopyrrolate 2 MG tablet  Commonly known as:  ROBINUL  Take 1 tablet (2 mg total) by mouth 2 (two) times daily.     ketorolac 30 MG/ML injection  Commonly known as:  TORADOL  One cc IM prn migraine.  No more than 2/day Dispense with 8 (eight)    3cc syringes with 1 inch 25g needles     lisdexamfetamine 60 MG capsule  Commonly known as:  [name]  Take 1 capsule (60 mg total) by mouth every morning.     metFORMIN 1000 MG tablet  Commonly known as:  GLUCOPHAGE  Take 1,000 mg by mouth 2 (two) times daily with a meal. Reported on 10/28/2015     natalizumab 300 MG/15ML injection  Commonly known as:  TYSABRI  Inject into the vein. Every 28 days     promethazine 25 MG tablet  Commonly known as:  PHENERGAN  Take 1 tablet (25 mg total) by mouth every 6 (six) hours as needed for nausea or vomiting.     SPRINTEC 28 0.25-35 MG-MCG tablet  Generic drug:  norgestimate-ethinyl estradiol  Take 1 tablet by mouth daily.     SUMAtriptan 100 MG tablet  Commonly known as:  IMITREX  Take 1 tablet (100 mg total) by mouth once as needed for migraine. May repeat in 2 hours if headache persists or recurs.     topiramate 100 MG tablet  Commonly known as:  TOPAMAX  Take 1 tablet (100 mg total) by mouth 2 (two) times daily.     TRINTELLIX 20 MG Tabs  Generic drug:  vortioxetine HBr  Take 20 mg by mouth daily.        ROS: Negative, with the exception of above mentioned in HPI  Objective:  BP 104/73 mmHg  Pulse 84   Temp(Src) 97.9 F (36.6 C)  Resp 20  Wt 249 lb 4 oz (113.059 kg)  SpO2 98% Body mass index is 32.89 kg/(m^2). Gen: Afebrile. No acute distress. Nontoxic in appearance, well developed, well nourished female. Pleasant.  HENT: AT. [place]. Bilateral TM visualized, with air fluid levels bilateral. No erythema. EAM normal.  MMM, no oral lesions. Bilateral nares with mild erythema, no swelling.  Throat without erythema or exudates. PND present. Mild cough on exam, no hoarseness. No TTP sinus.  Eyes:Pupils Equal Round Reactive to light, Extraocular movements intact,  Conjunctiva without redness, discharge or icterus. Neck/lymp/endocrine: Supple,no lymphadenopathy, No thyromegaly CV: RRR  Chest: CTAB, no wheeze or crackles. Good air movement, normal resp effort.  Abd: Soft. NTND. BS present.  Skin: no purpura or petechiae. Mildly raised, erythema papular rash over legs, arms and chest. Neuro: Normal gait. PERLA. EOMi. Alert. Oriented x3  Psych: Normal affect, dress and demeanor. Normal speech. Normal thought content and judgment.  Assessment/Plan: Anita Black is a 35 y.o. female present for acute OV for  Controlled type 2 diabetes mellitus without complication, without long-term current use of insulin (HCC) - last a1c 6.4, we have not managed her medications for this prior and she reports she is a diet controlled diabetic and the elevations were from medications. Will collect a1c today and if needed restart metformin.  - if elevated will need to monitor accordingly.  - HgB A1c   Allergic urticaria - start allegra daily. Vistaril for use with hives, cation with sedation. - hydrOXYzine (ATARAX/VISTARIL) 25 MG tablet; Take 1 tablet (25 mg total) by mouth every 12 (twelve) hours as needed.  Dispense: 60 tablet; Refill: 0   URI: appears viral. Pt is improving. Encouraged flonase start and symptomatic treatment. If worsens will need to see.   F/U depending on a1c results, may need to see every 3-6  mos.   electronically signed by:  Felix Pacini, DO  [facility] [place]

## 2015-10-28 NOTE — Patient Instructions (Signed)
Your URI sounds as if it is resolving and is likely viral. If symptoms return or worsen over the next 5 days please call back in, as you would need a prescription then.  Start Flonase for allergies daily for at least 2-4 weeks.  Start allegra daily for allergies and Hives. I have prescribed vistaril for help with hive symptoms, caution with sedation and mix with other medications. Only use when you have hives.   Hives Hives are itchy, red, swollen areas of the skin. They can vary in size and location on your body. Hives can come and go for hours or several days (acute hives) or for several weeks (chronic hives). Hives do not spread from person to person (noncontagious). They may get worse with scratching, exercise, and emotional stress. CAUSES   Allergic reaction to food, additives, or drugs.  Infections, including the common cold.  Illness, such as vasculitis, lupus, or thyroid disease.  Exposure to sunlight, heat, or cold.  Exercise.  Stress.  Contact with chemicals. SYMPTOMS   Red or white swollen patches on the skin. The patches may change size, shape, and location quickly and repeatedly.  Itching.  Swelling of the hands, feet, and face. This may occur if hives develop deeper in the skin. DIAGNOSIS  Your caregiver can usually tell what is wrong by performing a physical exam. Skin or blood tests may also be done to determine the cause of your hives. In some cases, the cause cannot be determined. TREATMENT  Mild cases usually get better with medicines such as antihistamines. Severe cases may require an emergency epinephrine injection. If the cause of your hives is known, treatment includes avoiding that trigger.  HOME CARE INSTRUCTIONS   Avoid causes that trigger your hives.  Take antihistamines as directed by your caregiver to reduce the severity of your hives. Non-sedating or low-sedating antihistamines are usually recommended. Do not drive while taking an  antihistamine.  Take any other medicines prescribed for itching as directed by your caregiver.  Wear loose-fitting clothing.  Keep all follow-up appointments as directed by your caregiver. SEEK MEDICAL CARE IF:   You have persistent or severe itching that is not relieved with medicine.  You have painful or swollen joints. SEEK IMMEDIATE MEDICAL CARE IF:   You have a fever.  Your tongue or lips are swollen.  You have trouble breathing or swallowing.  You feel tightness in the throat or chest.  You have abdominal pain. These problems may be the first sign of a life-threatening allergic reaction. Call your local emergency services (911 in [place] MAKE SURE YOU:   Understand these instructions.  Will watch your condition.  Will get help right away if you are not doing well or get worse.   This information is not intended to replace advice given to you by your health care provider. Make sure you discuss any questions you have with your health care provider.   Document Released: 04/26/2005 Document Revised: 05/01/2013 Document Reviewed: 07/20/2011 Elsevier Interactive Patient Education 2016 Elsevier Inc.  Upper Respiratory Infection, Adult Most upper respiratory infections (URIs) are a viral infection of the air passages leading to the lungs. A URI affects the nose, throat, and upper air passages. The most common type of URI is nasopharyngitis and is typically referred to as "the common cold." URIs run their course and usually go away on their own. Most of the time, a URI does not require medical attention, but sometimes a bacterial infection in the upper airways can follow a  viral infection. This is called a secondary infection. Sinus and middle ear infections are common types of secondary upper respiratory infections. Bacterial pneumonia can also complicate a URI. A URI can worsen asthma and chronic obstructive pulmonary disease (COPD). Sometimes, these complications can require  emergency medical care and may be life threatening.  CAUSES Almost all URIs are caused by viruses. A virus is a type of germ and can spread from one person to another.  RISKS FACTORS You may be at risk for a URI if:   You smoke.   You have chronic heart or lung disease.  You have a weakened defense (immune) system.   You are very young or very old.   You have nasal allergies or asthma.  You work in crowded or poorly ventilated areas.  You work in health care facilities or schools. SIGNS AND SYMPTOMS  Symptoms typically develop 2-3 days after you come in contact with a cold virus. Most viral URIs last 7-10 days. However, viral URIs from the influenza virus (flu virus) can last 14-18 days and are typically more severe. Symptoms may include:   Runny or stuffy (congested) nose.   Sneezing.   Cough.   Sore throat.   Headache.   Fatigue.   Fever.   Loss of appetite.   Pain in your forehead, behind your eyes, and over your cheekbones (sinus pain).  Muscle aches.  DIAGNOSIS  Your health care provider may diagnose a URI by:  Physical exam.  Tests to check that your symptoms are not due to another condition such as:  Strep throat.  Sinusitis.  Pneumonia.  Asthma. TREATMENT  A URI goes away on its own with time. It cannot be cured with medicines, but medicines may be prescribed or recommended to relieve symptoms. Medicines may help:  Reduce your fever.  Reduce your cough.  Relieve nasal congestion. HOME CARE INSTRUCTIONS   Take medicines only as directed by your health care provider.   Gargle warm saltwater or take cough drops to comfort your throat as directed by your health care provider.  Use a warm mist humidifier or inhale steam from a shower to increase air moisture. This may make it easier to breathe.  Drink enough fluid to keep your urine clear or pale yellow.   Eat soups and other clear broths and maintain good nutrition.   Rest  as needed.   Return to work when your temperature has returned to normal or as your health care provider advises. You may need to stay home longer to avoid infecting others. You can also use a face mask and careful hand washing to prevent spread of the virus.  Increase the usage of your inhaler if you have asthma.   Do not use any tobacco products, including cigarettes, chewing tobacco, or electronic cigarettes. If you need help quitting, ask your health care provider. PREVENTION  The best way to protect yourself from getting a cold is to practice good hygiene.   Avoid oral or hand contact with people with cold symptoms.   Wash your hands often if contact occurs.  There is no clear evidence that vitamin C, vitamin E, echinacea, or exercise reduces the chance of developing a cold. However, it is always recommended to get plenty of rest, exercise, and practice good nutrition.  SEEK MEDICAL CARE IF:   You are getting worse rather than better.   Your symptoms are not controlled by medicine.   You have chills.  You have worsening shortness of breath.  You have brown or red mucus.  You have yellow or brown nasal discharge.  You have pain in your face, especially when you bend forward.  You have a fever.  You have swollen neck glands.  You have pain while swallowing.  You have white areas in the back of your throat. SEEK IMMEDIATE MEDICAL CARE IF:   You have severe or persistent:  Headache.  Ear pain.  Sinus pain.  Chest pain.  You have chronic lung disease and any of the following:  Wheezing.  Prolonged cough.  Coughing up blood.  A change in your usual mucus.  You have a stiff neck.  You have changes in your:  Vision.  Hearing.  Thinking.  Mood. MAKE SURE YOU:   Understand these instructions.  Will watch your condition.  Will get help right away if you are not doing well or get worse.   This information is not intended to replace advice  given to you by your health care provider. Make sure you discuss any questions you have with your health care provider.   Document Released: 10/20/2000 Document Revised: 09/10/2014 Document Reviewed: 08/01/2013 Elsevier Interactive Patient Education [employer]

## 2015-10-29 ENCOUNTER — Telehealth: Payer: Self-pay | Admitting: Family Medicine

## 2015-10-29 MED ORDER — METFORMIN HCL 500 MG PO TABS: ORAL_TABLET | ORAL | Status: DC

## 2015-10-29 NOTE — Telephone Encounter (Signed)
Left message with results and detailed instructions for restarting metformin on patient voice mail.

## 2015-10-29 NOTE — Telephone Encounter (Signed)
Please call Anita Black: - her a1c is 7.3 (normal 5.5). She needs to restart metformin and follow in 3 months. I have started her on a taper up dose since she has not taken it in some time.

## 2015-10-31 ENCOUNTER — Other Ambulatory Visit: Payer: BC Managed Care – PPO

## 2015-11-04 ENCOUNTER — Ambulatory Visit: Payer: BC Managed Care – PPO | Admitting: Family Medicine

## 2015-11-10 ENCOUNTER — Telehealth: Payer: Self-pay | Admitting: *Deleted

## 2015-11-10 MED ORDER — LISDEXAMFETAMINE DIMESYLATE 60 MG PO CAPS: 60.0000 mg | ORAL_CAPSULE | ORAL | Status: DC

## 2015-11-10 MED ORDER — AMPHETAMINE-DEXTROAMPHETAMINE 10 MG PO TABS: ORAL_TABLET | ORAL | Status: DC

## 2015-11-10 NOTE — Telephone Encounter (Signed)
Pt. due for r/f's at infuison appt. on 11-12-15, but RAS will be ooo that day. Rx's prepared today and are up front [facility]/fim

## 2015-11-12 ENCOUNTER — Encounter: Payer: Self-pay | Admitting: Neurology

## 2015-11-13 ENCOUNTER — Telehealth: Payer: Self-pay | Admitting: *Deleted

## 2015-11-13 NOTE — Telephone Encounter (Signed)
I had a verbal conversation with Faith about this patient and agree with the recommendations given. Thank you Faith for calling Dr. Alwyn Ren office!

## 2015-11-13 NOTE — Telephone Encounter (Signed)
I have spoken with [facility] this morning.  She sts. she feels more depressed, despite [name] 5mg  po qd. Sts. she has intermittent SI, but denies a plan, and denies SI at this time. Sts. she recently self adjusted [name] to 10mg  po. I have advised her against self-adjusting her medications.  We had a lengthy, pleasant conversation.  She sts. she has seen Dr. Meredith Staggers, here in [place], for depression, around a yr. ago.  I have spoken with Dr. Rocco Pauls, and was initially given an appt. date of 12-02-15 at 9am. I spoke with [facility] and she was agreeable to keeping this appt., but as she has c/o intermittent SI, I have advised she go to [facility] ER this morning, for immed. eval by the psych assessment team.  She was agreeable and sts. her husband is at work, but is supportive of her, and if she calls him, he will leave work and go to the hospital with her. I have asked again if she feels SI at this time and she has denied current SI. Immed. after speaking with [facility] [place] and making a plan for her to go to the ER now, I received a call back from Sarah with Dr. Alwyn Ren office.  She stated they had a cancellation, and Dr. Jennelle Human can see Anita Black at 1415 today, arrival time of 1400.  I called Chaylee back-she is agreeable to seeing Dr. Jennelle Human today.  I have given her Dr. Alwyn Ren new [id number]--[address], [place].  She verbalized understanding of same.  Again, I have asked her if she feels SI at this time and she has denied SI, expresses relief at having appt. with Dr. Jennelle Human today.  She will call me back if needed. RAS is out of the office this week.  I will review this call with Dr. Frances Furbish, w.i md for today, and make prn adjustments to [facility] tx. plan at her direction/fim

## 2015-11-13 NOTE — Telephone Encounter (Signed)
Pt returned call to nurse. The nurse will have to call pt back. Pt expressed understanding

## 2015-11-14 NOTE — Telephone Encounter (Signed)
Faith, thank you for getting her in so quick.

## 2015-11-26 ENCOUNTER — Telehealth: Payer: Self-pay | Admitting: *Deleted

## 2015-11-26 NOTE — Telephone Encounter (Signed)
Medical records faxed to [facility] on 11/26/2015  

## 2015-12-10 ENCOUNTER — Other Ambulatory Visit: Payer: Self-pay

## 2015-12-10 MED ORDER — LISDEXAMFETAMINE DIMESYLATE 60 MG PO CAPS: 60.0000 mg | ORAL_CAPSULE | ORAL | 0 refills | Status: DC

## 2015-12-23 DIAGNOSIS — Z0289 Encounter for other administrative examinations: Secondary | ICD-10-CM

## 2015-12-29 ENCOUNTER — Encounter: Payer: Self-pay | Admitting: Neurology

## 2015-12-29 ENCOUNTER — Ambulatory Visit (INDEPENDENT_AMBULATORY_CARE_PROVIDER_SITE_OTHER): Payer: BC Managed Care – PPO | Admitting: Neurology

## 2015-12-29 VITALS — BP 116/88 | HR 82 | Resp 18 | Ht 73.0 in | Wt 240.8 lb

## 2015-12-29 DIAGNOSIS — F329 Major depressive disorder, single episode, unspecified: Secondary | ICD-10-CM

## 2015-12-29 DIAGNOSIS — G35 Multiple sclerosis: Secondary | ICD-10-CM

## 2015-12-29 DIAGNOSIS — R200 Anesthesia of skin: Secondary | ICD-10-CM | POA: Diagnosis not present

## 2015-12-29 DIAGNOSIS — F32A Depression, unspecified: Secondary | ICD-10-CM

## 2015-12-29 DIAGNOSIS — G43009 Migraine without aura, not intractable, without status migrainosus: Secondary | ICD-10-CM | POA: Diagnosis not present

## 2015-12-29 MED ORDER — TOPIRAMATE 100 MG PO TABS: 100.0000 mg | ORAL_TABLET | Freq: Two times a day (BID) | ORAL | 11 refills | Status: DC

## 2015-12-29 NOTE — Progress Notes (Signed)
[facility]  PATIENT: Anita Black DOB: [birth year]     HISTORICAL  CHIEF COMPLAINT:  Chief Complaint  Patient presents with  . Multiple Sclerosis    Sts. she continues to tolerate Tysabri well.  JCV ab last checked 35-1-17 and was negative at 0.10.  Sts. she continues to struggle with fatigue--it is difficult to even dry her hair in the mornings b/c arms are fatigued.  She is seeing a psychiatrist and a therapist both for her depression.  Sts. [name] has been increased and this has helped/fim    HISTORY OF PRESENT ILLNESS:   Anita Black is a 35 year old woman with MS , headaches and depression.  MS:   She is on Tysabri 300 mg IV monthly and tolerates it well. She has not had any recent MS exacerbation. Her JCV antibody was checked 2 months ago and was negative at 0.10.    No new symptoms  Headaches/neck pain:     Migraines are much better since starting Topiramate --- only one a month and it is milder.  The TPI and topiramate have helped.   Gait/strength/sensation: She feels gait is doing well and balance is good most of the time. However she is feeling weak in her arms with some fluctuation.   She gets leg dysesthesias intermittently .  Legs cramp at night (calves mostly) 6-7 nights a week.                    Bladder/bowel: She denies any bladder dysfunction. She has 3 x nocturia,     She has not had any recent bowel incontinence  Vision: She has had optic neuritis involving each eye. When tired, vision seems more blurry.  VA is 20/30 out of either eye.   She has not had diplopia.  Fatigue/sleep: Fatigue is more of a problem, even when not in the heat.   Cognitive fatigue is helped by [name] more than physical.   She feels apathetic at times.. Amantadine had not helped in the past.  mmmmmmmm   Sleep onset is easy and she has less insomnia now.    She snores but has never been noted to have pauses in her breathing or gasping.   She wakes up with nocturia  but falls back asleep in 35-0 minutes,     Mood/social:   Mood is doing better on combination of Trintellix and [name] but she still has depression and has occasional tearfulness (has in office now).   She sees Dr. Jennelle Human once a month.    She has less agitaiton since [name] was added.         Cognition: Cognition has been a problem .   She notes reduced STM and focus is reduced.  . She also  notes difficulty with verbal fluency.   MS History:  She was diagnosed with multiple sclerosis in 2009 after presenting with numbness in the arms and torso, primarily in the front of her body. An MRI of the brain was normal but an MRI of the spinal cord showed 2 cervical plaques. She was started on Rebif but had difficulty tolerating the medication and after about a year switched to Copaxone. Unfortunately, she also had trouble tolerating Copaxone. She then was on Copaxone and had injeciotn reactions and switched to Avonex.  After her pregnanacy, she was switched to Gilenya. However, she had bradycardia and that was discontinued. In January 2015 she started Tysabri and continues on that medication. [place]. She continues her Tysabri treatments here.     She is JCV antibody negative. She feels better with the Tysabri infusions in general. However, she gets nausea that lasts several days after each infusion.   MRI Brain from 07/01/14 shows about 10-12 hyperintense foci consistent with MS. No lesions were seen in the posterior fossa.    ROS:  Out of a complete 14 system review of symptoms, the patient complains only of the following symptoms, and all other reviewed systems are negative.   She notes fatigue and depression.   She has headaches, anddifficulty with attention, Snoring, rare bowel incontinence   ALLERGIES: No Known Allergies  HOME MEDICATIONS:  Current Outpatient Prescriptions:  .  alprazolam (XANAX) 2 MG tablet, Take 2 mg by mouth 3 (three) times daily as needed. ,  Disp: , Rfl:  .  amphetamine-dextroamphetamine (ADDERALL) 10 MG tablet, Take one tablet daily in the afternoon, Disp: 30 tablet, Rfl: 0 .  ARIPiprazole ([name]) 5 MG tablet, Take 1 tablet (5 mg total) by mouth daily., Disp: 30 tablet, Rfl: 5 .  BD SAFETYGLIDE SHIELDED NEEDLE 25G X 1" MISC, See admin instructions., Disp: , Rfl: 11 .  glycopyrrolate (ROBINUL) 2 MG tablet, Take 1 tablet (2 mg total) by mouth 2 (two) times daily., Disp: 60 tablet, Rfl: 11 .  hydrOXYzine (ATARAX/VISTARIL) 25 MG tablet, Take 1 tablet (25 mg total) by mouth every 12 (twelve) hours as needed., Disp: 60 tablet, Rfl: 0 .  ketorolac (TORADOL) 30 MG/ML injection, One cc IM prn migraine.  No more than 2/day Dispense with 8 (eight)    3cc syringes with 1 inch 25g needles, Disp: 8 mL, Rfl: 11 .  lisdexamfetamine ([name]) 60 MG capsule, Take 1 capsule (60 mg total) by mouth every morning., Disp: 30 capsule, Rfl: 0 .  metFORMIN (GLUCOPHAGE) 500 MG tablet, 500 mg BID for 2 weeks, then 1000 mg BID with meals., Disp: 120 tablet, Rfl: 2 .  natalizumab (TYSABRI) 300 MG/15ML injection, Inject into the vein. Every 28 days, Disp: , Rfl:  .  promethazine (PHENERGAN) 25 MG tablet, Take 1 tablet (25 mg total) by mouth every 6 (six) hours as needed for nausea or vomiting., Disp: 12 tablet, Rfl: 0 .  SPRINTEC 28 0.25-35 MG-MCG tablet, Take 1 tablet by mouth daily., Disp: , Rfl:  .  SUMAtriptan (IMITREX) 100 MG tablet, Take 1 tablet (100 mg total) by mouth once as needed for migraine. May repeat in 2 hours if headache persists or recurs., Disp: 10 tablet, Rfl: 5 .  topiramate (TOPAMAX) 100 MG tablet, Take 1 tablet (100 mg total) by mouth 2 (two) times daily., Disp: 60 tablet, Rfl: 11 .  TRINTELLIX 20 MG TABS, Take 20 mg by mouth daily., Disp: 30 tablet, Rfl: 5  PAST MEDICAL HISTORY: Past Medical History:  Diagnosis Date  . Anxiety   . Depression   . Diabetes type 2, controlled (HCC)   . Elevated hemoglobin A1c   . Hyperlipidemia   .  Migraine   . Multiple sclerosis (HCC)    receives Tysabri infusion q 28 days    PAST SURGICAL HISTORY: Past Surgical History:  Procedure Laterality Date  . ABDOMINOPLASTY    . CESAREAN SECTION    . CHOLECYSTECTOMY      FAMILY HISTORY: Family History  Problem Relation Age of Onset  . Diabetes Mother   . Hypertension Mother   . Dementia Mother   . Bipolar disorder Mother   . Heart failure Father   .  Hypertension Father   . Heart disease Father   . Ovarian cancer Maternal Grandmother   . Alzheimer's disease Paternal Grandmother   . Skin cancer Maternal Grandfather     SOCIAL HISTORY:  Social History   Social History  . Marital status: Married    Spouse name: Rollen SoxBrinkley  . Number of children: 1  . Years of education: MA   [place]  [employer]   Social History Main Topics  . Smoking status: Never Smoker  . Smokeless tobacco: Never Used  . Alcohol use Yes     Comment: socially; once a week  . Drug use: No  . Sexual activity: Yes   Other Topics Concern  . Not on file   Social History Narrative   Patient lives at home with family.   Caffeine Use: 1-2 cups daily   No tobacco, recreational drugs. Social drinking only.   Wears her seatbelt, has smoke alarm in the home.   Feels safe in her relationships.     PHYSICAL EXAM  Vitals:   12/29/15 1312  BP: 116/88  Pulse: 82  Resp: 18  Weight: 240 lb 12.8 oz (109.2 kg)  Height: 6\' 1"  (1.854 m)    Body mass index is 31.77 kg/m.   General: The patient is well-developed and well-nourished and in no acute distress  Neck:   She is now non-very tender.   Good ROM.  Neurologic Exam  Mental status: The patient is alert and oriented x 3 at the time of the examination. The patient has apparent normal recent and remote memory, with an apparently normal attention span and concentration ability.   Speech is normal.  Cranial nerves: Extraocular movements are full.  Facial symmetry is present.  There is good facial sensation to soft touch bilaterally.Facial strength is normal.  Trapezius and sternocleidomastoid strength is normal. No dysarthria is noted. No obvious hearing deficits are noted.  Motor:  Muscle bulk is normal.   Tone is normal. Strength is  5 / 5 in all 4 extremities.   Sensory: Sensory testing is intact to pinprick, soft touch and vibration sensation in all 4 extremities.  Coordination: Cerebellar testing reveals good finger-nose-finger bilaterally.  Gait and station: Station is normal.   Gait is normal today. Tandem gait is mildly wide. Romberg is negative.   Reflexes: Deep tendon reflexes are symmetric and 1+ bilaterally.     DIAGNOSTIC DATA (LABS, IMAGING, TESTING) - I reviewed patient records, labs, notes, testing and imaging myself where available.  Lab Results  Component Value Date   WBC 7.6 06/11/2015   HGB 12.1 05/01/2015   HCT 34.5 06/11/2015   MCV 79 06/11/2015   PLT 252 06/11/2015      Component Value Date/Time   NA 137 05/01/2015 0949   NA 137 04/15/2014 1554   K 4.4 05/01/2015 0949   CL 104 05/01/2015 0949   CO2 25 05/01/2015 0949   GLUCOSE 108 (H) 05/01/2015 0949   BUN 14 05/01/2015 0949   BUN 15 04/15/2014 1554   CREATININE 0.67 05/01/2015 0949   CALCIUM 9.5 05/01/2015 0949   PROT 6.8 05/01/2015 0949   PROT 6.8 05/27/2014 1624   ALBUMIN 4.2 05/01/2015 0949   ALBUMIN 4.2 05/27/2014 1624   AST 10 05/01/2015 0949   ALT 9 05/01/2015 0949   ALKPHOS 100 05/01/2015 0949   BILITOT 0.8 05/01/2015 0949   GFRNONAA 102 04/15/2014 1554   GFRAA 117 04/15/2014 1554       ASSESSMENT AND  PLAN  Multiple sclerosis (HCC) - Plan: CBC with Differential/Platelet, Hepatic function panel, Stratify JCV Antibody Test ([facility])  MS (multiple sclerosis) (HCC)  Depression  Common migraine without intractability  Numbness    1.  Continue Tysabri for her multiple sclerosis.    Check JCV Ab 2.  Continue Trintellix.  She discussed that Dr. Jennelle Human  may want to switch her from [name] to lamotrigine. I am fine with that. Lamotrigine is usually very well tolerated and will mix well with her other medications. 3.  Refill topirmate at 200 mg daily.  She is advised to also discussed with her gynecologist as Topamax can affect birth control pills. 4.  Return to clinic in 6 months for a regular follow-up visit or call sooner if new or worsening neurologic symptoms.  Brylin Stopper A. Epimenio Foot, MD, PhD 12/29/2015, 1:38 PM Certified in Neurology, Clinical Neurophysiology, Sleep Medicine, Pain Medicine and Neuroimaging  [facility] [address] [place], [place] [address] [phone] [[k

## 2015-12-30 LAB — CBC WITH DIFFERENTIAL/PLATELET
BASOS ABS: 0 10*3/uL (ref 0.0–0.2)
Basos: 0 %
EOS (ABSOLUTE): 0.1 10*3/uL (ref 0.0–0.4)
EOS: 1 %
HEMATOCRIT: 37.9 % (ref 34.0–46.6)
Hemoglobin: 12.6 g/dL (ref 11.1–15.9)
IMMATURE GRANULOCYTES: 0 %
Immature Grans (Abs): 0 10*3/uL (ref 0.0–0.1)
Lymphocytes Absolute: 4 10*3/uL — ABNORMAL HIGH (ref 0.7–3.1)
Lymphs: 51 %
MCH: 28.1 pg (ref 26.6–33.0)
MCHC: 33.2 g/dL (ref 31.5–35.7)
MCV: 84 fL (ref 79–97)
MONOS ABS: 0.4 10*3/uL (ref 0.1–0.9)
Monocytes: 5 %
NEUTROS PCT: 43 %
Neutrophils Absolute: 3.3 10*3/uL (ref 1.4–7.0)
PLATELETS: 250 10*3/uL (ref 150–379)
RBC: 4.49 x10E6/uL (ref 3.77–5.28)
RDW: 14.7 % (ref 12.3–15.4)
WBC: 7.8 10*3/uL (ref 3.4–10.8)

## 2015-12-30 LAB — HEPATIC FUNCTION PANEL
ALBUMIN: 4.5 g/dL (ref 3.5–5.5)
ALK PHOS: 120 IU/L — AB (ref 39–117)
ALT: 21 IU/L (ref 0–32)
AST: 14 IU/L (ref 0–40)
BILIRUBIN, DIRECT: 0.15 mg/dL (ref 0.00–0.40)
Bilirubin Total: 0.6 mg/dL (ref 0.0–1.2)
Total Protein: 6.9 g/dL (ref 6.0–8.5)

## 2016-01-05 ENCOUNTER — Encounter: Payer: Self-pay | Admitting: *Deleted

## 2016-01-06 ENCOUNTER — Other Ambulatory Visit: Payer: Self-pay | Admitting: Neurology

## 2016-01-06 MED ORDER — AMPHETAMINE-DEXTROAMPHETAMINE 10 MG PO TABS
ORAL_TABLET | ORAL | 0 refills | Status: DC
Start: 2016-01-06 — End: 2016-06-30

## 2016-01-06 MED ORDER — LISDEXAMFETAMINE DIMESYLATE 60 MG PO CAPS: 60.0000 mg | ORAL_CAPSULE | ORAL | 0 refills | Status: DC

## 2016-01-30 ENCOUNTER — Encounter: Payer: Self-pay | Admitting: Family Medicine

## 2016-01-30 ENCOUNTER — Ambulatory Visit (INDEPENDENT_AMBULATORY_CARE_PROVIDER_SITE_OTHER): Payer: BC Managed Care – PPO | Admitting: Family Medicine

## 2016-01-30 VITALS — BP 103/73 | HR 88 | Temp 97.9°F | Resp 20 | Wt 236.5 lb

## 2016-01-30 DIAGNOSIS — R112 Nausea with vomiting, unspecified: Secondary | ICD-10-CM | POA: Diagnosis not present

## 2016-01-30 LAB — POC URINALSYSI DIPSTICK (AUTOMATED)
Bilirubin, UA: NEGATIVE
Blood, UA: NEGATIVE
Glucose, UA: NEGATIVE
KETONES UA: NEGATIVE
LEUKOCYTES UA: NEGATIVE
Nitrite, UA: NEGATIVE
PH UA: 6.5
PROTEIN UA: NEGATIVE
SPEC GRAV UA: 1.02
Urobilinogen, UA: 0.2

## 2016-01-30 LAB — COMPREHENSIVE METABOLIC PANEL
ALK PHOS: 94 U/L (ref 33–115)
ALT: 18 U/L (ref 6–29)
AST: 13 U/L (ref 10–30)
Albumin: 4.2 g/dL (ref 3.6–5.1)
BILIRUBIN TOTAL: 0.6 mg/dL (ref 0.2–1.2)
BUN: 14 mg/dL (ref 7–25)
CO2: 20 mmol/L (ref 20–31)
Calcium: 9.9 mg/dL (ref 8.6–10.2)
Chloride: 106 mmol/L (ref 98–110)
Creat: 0.9 mg/dL (ref 0.50–1.10)
GLUCOSE: 106 mg/dL — AB (ref 65–99)
Potassium: 4.1 mmol/L (ref 3.5–5.3)
Sodium: 139 mmol/L (ref 135–146)
Total Protein: 6.8 g/dL (ref 6.1–8.1)

## 2016-01-30 LAB — LIPASE: LIPASE: 22 U/L (ref 7–60)

## 2016-01-30 LAB — POCT URINE PREGNANCY: PREG TEST UR: NEGATIVE

## 2016-01-30 LAB — POCT GLYCOSYLATED HEMOGLOBIN (HGB A1C): Hemoglobin A1C: 6

## 2016-01-30 MED ORDER — ONDANSETRON HCL 4 MG PO TABS: 4.0000 mg | ORAL_TABLET | Freq: Three times a day (TID) | ORAL | 0 refills | Status: DC | PRN

## 2016-01-30 NOTE — Patient Instructions (Signed)
Negative pregnancy test, negative urine studies. a1c is 6.0, well controlled.  I have called in zofran for you for nausea. Take only as needed every 8 hours .  Once I get results back, I may still have you come in for H. Pylori testing.   If we can not find cause I would want you to follow with neurology.

## 2016-01-30 NOTE — Progress Notes (Signed)
Anita Black , June 02, 1980, 35 y.o., female MRN: [id number] Patient Care Team    Relationship Specialty Notifications Start End  Ma Hillock, DO PCP - General Family Medicine  02/26/15     CC: nausea Subjective: Pt presents for an acute OV with complaints of nausea of  2.5 months duration.  Associated symptoms include decreased appetite, weight loss and fatigue. She reports she had a bought of depression over the summer. The last time that occurred she had an MS flare, so she went to see her neurologist. The neurologist felt she needed to see her psychiatrist, so she went to them and they changed her medications. She states the nausea proceeded all the above. She feels her depression is now controlled, however her nausea remained. Her nausea is constant and is not improved or worsened by time of day or associated with meals or empty stomach. She is on birth control and her menses are regular, LMP 2 weeks ago.  She denies headaches, visual changes, dizziness, tinnitus or eye pain.   No Known Allergies Social History  Substance Use Topics  . Smoking status: Never Smoker  . Smokeless tobacco: Never Used  . Alcohol use Yes     Comment: socially; once a week   Past Medical History:  Diagnosis Date  . Anxiety   . Depression   . Diabetes type 2, controlled ([facility])   . Elevated hemoglobin A1c   . Hyperlipidemia   . Migraine   . Multiple sclerosis (HCC)    receives Tysabri infusion q 28 days   Past Surgical History:  Procedure Laterality Date  . ABDOMINOPLASTY    . CESAREAN SECTION    . CHOLECYSTECTOMY     Family History  Problem Relation Age of Onset  . Diabetes Mother   . Hypertension Mother   . Dementia Mother   . Bipolar disorder Mother   . Heart failure Father   . Hypertension Father   . Heart disease Father   . Ovarian cancer Maternal Grandmother   . Alzheimer's disease Paternal Grandmother   . Skin cancer Maternal Grandfather      Medication List         Accurate as of 01/30/16  4:23 PM. Always use your most recent med list.          alprazolam 2 MG tablet Commonly known as:  XANAX Take 2 mg by mouth 3 (three) times daily as needed.   amphetamine-dextroamphetamine 10 MG tablet Commonly known as:  ADDERALL Take one tablet daily in the afternoon   BD SAFETYGLIDE SHIELDED NEEDLE 25G X 1" Misc Generic drug:  NEEDLE (DISP) 25 G See admin instructions.   glycopyrrolate 2 MG tablet Commonly known as:  ROBINUL Take 1 tablet (2 mg total) by mouth 2 (two) times daily.   ketorolac 30 MG/ML injection Commonly known as:  TORADOL One cc IM prn migraine.  No more than 2/day Dispense with 8 (eight)    3cc syringes with 1 inch 25g needles   lisdexamfetamine 60 MG capsule Commonly known as:  [name] Take 1 capsule (60 mg total) by mouth every morning.   lithium carbonate 300 MG capsule TAKE 1 CAPSULE BY MOUTH IN THE EVENING FOR 5 DAYS THEN INCREASE TO 2 CAPS IN EVENING   LORYNA 3-0.02 MG tablet Generic drug:  drospirenone-ethinyl estradiol TAKE ONE TABLET EACH DAY   metFORMIN 500 MG tablet Commonly known as:  GLUCOPHAGE 500 mg BID for 2 weeks, then 1000 mg BID with meals.  natalizumab 300 MG/15ML injection Commonly known as:  TYSABRI Inject into the vein. Every 28 days   ondansetron 4 MG tablet Commonly known as:  ZOFRAN Take 1 tablet (4 mg total) by mouth every 8 (eight) hours as needed for nausea or vomiting.   promethazine 25 MG tablet Commonly known as:  PHENERGAN Take 1 tablet (25 mg total) by mouth every 6 (six) hours as needed for nausea or vomiting.   SUMAtriptan 100 MG tablet Commonly known as:  IMITREX Take 1 tablet (100 mg total) by mouth once as needed for migraine. May repeat in 2 hours if headache persists or recurs.   topiramate 100 MG tablet Commonly known as:  TOPAMAX Take 1 tablet (100 mg total) by mouth 2 (two) times daily.   TRINTELLIX 20 MG Tabs Generic drug:  vortioxetine HBr Take 20 mg by mouth  daily.       Results for orders placed or performed in visit on 01/30/16 (from the past 24 hour(s))  POCT HgB A1C     Status: None   Collection Time: 01/30/16  4:06 PM  Result Value Ref Range   Hemoglobin A1C 6.0   POCT urine pregnancy     Status: None   Collection Time: 01/30/16  4:06 PM  Result Value Ref Range   Preg Test, Ur Negative Negative  POCT Urinalysis Dipstick (Automated)     Status: None   Collection Time: 01/30/16  4:07 PM  Result Value Ref Range   Color, UA dark yellow    Clarity, UA clear    Glucose, UA negative    Bilirubin, UA negative    Ketones, UA negative    Spec Grav, UA 1.020    Blood, UA negative    pH, UA 6.5    Protein, UA negative    Urobilinogen, UA 0.2    Nitrite, UA negative    Leukocytes, UA Negative Negative   No results found.   ROS: Negative, with the exception of above mentioned in HPI   Objective:  BP 103/73 (BP Location: Right Arm, Patient Position: Sitting, Cuff Size: Large)   Pulse 88   Temp 97.9 F (36.6 C)   Resp 20   Wt 236 lb 8 oz (107.3 kg)   LMP 01/16/2016 (Approximate)   SpO2 97%   BMI 31.20 kg/m  Body mass index is 31.2 kg/m. Gen: Afebrile. No acute distress. Nontoxic in appearance, well developed, well nourished. Obese caucasian female.  HENT: AT. [place].  MMM, no oral lesions. Eyes:Pupils Equal Round Reactive to light, Extraocular movements intact,  Conjunctiva without redness, discharge or icterus. Neck/lymp/endocrine: Supple,no lymphadenopathy CV: RRR  Chest: CTAB, no wheeze or crackles. Abd: Soft. obese. ND. Mild epigastric tenderness, mild left peri umbilical TTP. BS present. no Masses palpated. No rebound or guarding.  Skin: no rashes, purpura or petechiae.  Neuro: Normal gait. PERLA. EOMi. Alert. Oriented x3  Psych: Normal affect, dress and demeanor. Normal speech. Normal thought content and judgment.  Urinalysis    Component Value Date/Time   BILIRUBINUR negative 01/30/2016 1607   PROTEINUR negative  01/30/2016 1607   UROBILINOGEN 0.2 01/30/2016 1607   NITRITE negative 01/30/2016 1607   LEUKOCYTESUR Negative 01/30/2016 1607    Assessment/Plan: Anita Black is a 35 y.o. female present for acute OV for  Nausea and vomiting - H. pylori antibody, IgG--> collect future if CMP normal. Consider PPI.  - Lipase, Comp Met (CMET) collected today - POCT HgB A1C 6.0 today, well controlled - POCT urine pregnancy-->  negative - POCT Urinalysis Dipstick (Automated)--> negative - consider gastroparesis study or abd [place] - If all studies normal pt is to be seen with neurology to ensure not a neurological cause. - F/U dependent on results.    electronically signed by:  Howard Pouch, DO  [facility]

## 2016-02-02 ENCOUNTER — Ambulatory Visit: Payer: BC Managed Care – PPO | Admitting: Family Medicine

## 2016-02-02 ENCOUNTER — Telehealth: Payer: Self-pay | Admitting: Family Medicine

## 2016-02-02 NOTE — Telephone Encounter (Signed)
Please call pt: - her labs are all stable.  - I do want her to come in have the H.Pylori drawn asap, the order is in but may need change to a future a now.  - I also think she should contact her neurologist concerning her dizziness.

## 2016-02-02 NOTE — Telephone Encounter (Signed)
Left message with lab results and detailed instructions on patient voice mail per DPR. 

## 2016-02-03 ENCOUNTER — Encounter: Payer: Self-pay | Admitting: Neurology

## 2016-02-03 ENCOUNTER — Telehealth: Payer: Self-pay | Admitting: *Deleted

## 2016-02-03 DIAGNOSIS — R109 Unspecified abdominal pain: Secondary | ICD-10-CM

## 2016-02-03 NOTE — Telephone Encounter (Signed)
Re-entered H-Pylori test as future.

## 2016-02-03 NOTE — Addendum Note (Signed)
Addended by: Thomasena Edis on: 02/03/2016 03:24 PM   Modules accepted: Orders

## 2016-02-04 ENCOUNTER — Telehealth: Payer: Self-pay | Admitting: Family Medicine

## 2016-02-04 ENCOUNTER — Other Ambulatory Visit (INDEPENDENT_AMBULATORY_CARE_PROVIDER_SITE_OTHER): Payer: BC Managed Care – PPO

## 2016-02-04 DIAGNOSIS — R109 Unspecified abdominal pain: Secondary | ICD-10-CM

## 2016-02-04 LAB — H. PYLORI ANTIBODY, IGG: H PYLORI IGG: NEGATIVE

## 2016-02-04 MED ORDER — OMEPRAZOLE 40 MG PO CPDR
40.0000 mg | DELAYED_RELEASE_CAPSULE | Freq: Every day | ORAL | 1 refills | Status: DC
Start: 2016-02-04 — End: 2016-06-30

## 2016-02-04 NOTE — Telephone Encounter (Signed)
Please call pt: - h.pylori negative. I have called in omperazole 40 mg QD and would like to see her back to investigate further.  - I had asked her to follow up with neuro for her symptoms with her history of MS and multiple medications, by phone notes they did not seem to think it was related.  -  I would want to see her back and consider more in depth GI work up/referral.

## 2016-02-05 NOTE — Telephone Encounter (Signed)
Patient aware of results.  She states she sent neuro her symptoms on the online portal and he did not believe this was MS related.  Patient scheduled tomorrow  @ 1pm.  ( I scheduled her 30 minutes ).

## 2016-02-06 ENCOUNTER — Ambulatory Visit (INDEPENDENT_AMBULATORY_CARE_PROVIDER_SITE_OTHER): Payer: BC Managed Care – PPO | Admitting: Family Medicine

## 2016-02-06 ENCOUNTER — Encounter: Payer: Self-pay | Admitting: Family Medicine

## 2016-02-06 VITALS — BP 103/71 | HR 84 | Temp 97.7°F | Resp 20 | Wt 234.0 lb

## 2016-02-06 DIAGNOSIS — R634 Abnormal weight loss: Secondary | ICD-10-CM | POA: Diagnosis not present

## 2016-02-06 DIAGNOSIS — R110 Nausea: Secondary | ICD-10-CM

## 2016-02-06 DIAGNOSIS — R1013 Epigastric pain: Secondary | ICD-10-CM

## 2016-02-06 MED ORDER — ALIGN 4 MG PO CAPS: 1.0000 | ORAL_CAPSULE | Freq: Every day | ORAL | 0 refills | Status: DC

## 2016-02-06 NOTE — Progress Notes (Signed)
Anita Black , 1980/08/10, 35 y.o., female MRN: [id number] Patient Care Team    Relationship Specialty Notifications Start End  Anita Leatherwoodenee A Mikai Meints, DO PCP - General Family Medicine  02/26/15     CC: nausea Subjective:  Patient presents for follow-up on her nausea. Patient was started on Zofran and Phenergan which has not been effective to help with her nausea. She has contacted her neurologist which did not feel like this was related to her MS or her MS medications. Agents nausea has been present for approximately 3 months duration.  Associated symptoms include decreased appetite, weight loss (15 lbs), nausea and fatigue. She reports she had a bought of depression over the summer. The last time that occurred she had an MS flare, so she went to see her neurologist. The neurologist felt she needed to see her psychiatrist, so she went to them and they changed her medications. She states the nausea proceeded all the above, therefore likely not any changes in medication. She feels her depression is now controlled, however her nausea remains. Her nausea is constant and is not improved or worsened by time of day or associated with meals or empty stomach. She is on birth control and her menses are regular, LMP 2 weeks ago.  She denies headaches, visual changes, dizziness, tinnitus or eye pain. She states she really has no abdominal pain, but occasionally she'll have a "twinge" and points to her upper epigastric area just below sternum. She has had a cholecystectomy. She states she has a bowel movement now approximately every 4 days, but this changed only after her appetite had decreased and she wasn't eating as much. She is on lithium now which is a change, but again lithium was started after the nausea. CMP within normal limits, hemoglobin A1c 6.0, negative pregnancy test, negative H. pylori, negative urine studies.   No Known Allergies Social History  Substance Use Topics  . Smoking status: Never Smoker   . Smokeless tobacco: Never Used  . Alcohol use Yes     Comment: socially; once a week   Past Medical History:  Diagnosis Date  . Anxiety   . Depression   . Diabetes type 2, controlled (HCC)   . Elevated hemoglobin A1c   . Hyperlipidemia   . Migraine   . Multiple sclerosis ([facility])    receives Tysabri infusion q 28 days   Past Surgical History:  Procedure Laterality Date  . ABDOMINOPLASTY    . CESAREAN SECTION    . CHOLECYSTECTOMY     Family History  Problem Relation Age of Onset  . Diabetes Mother   . Hypertension Mother   . Dementia Mother   . Bipolar disorder Mother   . Heart failure Father   . Hypertension Father   . Heart disease Father   . Ovarian cancer Maternal Grandmother   . Alzheimer's disease Paternal Grandmother   . Skin cancer Maternal Grandfather      Medication List       Accurate as of 02/06/16  1:13 PM. Always use your most recent med list.          alprazolam 2 MG tablet Commonly known as:  XANAX Take 2 mg by mouth 3 (three) times daily as needed.   amphetamine-dextroamphetamine 10 MG tablet Commonly known as:  ADDERALL Take one tablet daily in the afternoon   BD SAFETYGLIDE SHIELDED NEEDLE 25G X 1" Misc Generic drug:  NEEDLE (DISP) 25 G See admin instructions.   glycopyrrolate 2 MG  tablet Commonly known as:  ROBINUL Take 1 tablet (2 mg total) by mouth 2 (two) times daily.   ketorolac 30 MG/ML injection Commonly known as:  TORADOL One cc IM prn migraine.  No more than 2/day Dispense with 8 (eight)    3cc syringes with 1 inch 25g needles   lisdexamfetamine 60 MG capsule Commonly known as:  [name] Take 1 capsule (60 mg total) by mouth every morning.   lithium carbonate 300 MG capsule TAKE 1 CAPSULE BY MOUTH IN THE EVENING FOR 5 DAYS THEN INCREASE TO 2 CAPS IN EVENING   LORYNA 3-0.02 MG tablet Generic drug:  drospirenone-ethinyl estradiol TAKE ONE TABLET EACH DAY   metFORMIN 500 MG tablet Commonly known as:  GLUCOPHAGE 500 mg  BID for 2 weeks, then 1000 mg BID with meals.   natalizumab 300 MG/15ML injection Commonly known as:  TYSABRI Inject into the vein. Every 28 days   omeprazole 40 MG capsule Commonly known as:  PRILOSEC Take 1 capsule (40 mg total) by mouth daily.   ondansetron 4 MG tablet Commonly known as:  ZOFRAN Take 1 tablet (4 mg total) by mouth every 8 (eight) hours as needed for nausea or vomiting.   promethazine 25 MG tablet Commonly known as:  PHENERGAN Take 1 tablet (25 mg total) by mouth every 6 (six) hours as needed for nausea or vomiting.   SUMAtriptan 100 MG tablet Commonly known as:  IMITREX Take 1 tablet (100 mg total) by mouth once as needed for migraine. May repeat in 2 hours if headache persists or recurs.   topiramate 100 MG tablet Commonly known as:  TOPAMAX Take 1 tablet (100 mg total) by mouth 2 (two) times daily.   TRINTELLIX 20 MG Tabs Generic drug:  vortioxetine HBr Take 20 mg by mouth daily.       No results found for this or any previous visit (from the past 24 hour(s)). No results found.   ROS: Negative, with the exception of above mentioned in HPI   Objective:  BP 103/71 (BP Location: Right Arm, Patient Position: Sitting, Cuff Size: Large)   Pulse 84   Temp 97.7 F (36.5 C)   Resp 20   Wt 234 lb (106.1 kg)   LMP 01/16/2016 (Approximate)   SpO2 97%   BMI 30.87 kg/m  Body mass index is 30.87 kg/m. Gen: Afebrile. No acute distress. Nontoxic in appearance, well developed, well nourished. Obese caucasian female.  HENT: AT. [place].  MMM, no oral lesions. Eyes:Pupils Equal Round Reactive to light, Extraocular movements intact,  Conjunctiva without redness, discharge or icterus. Neck/lymp/endocrine: Supple,no lymphadenopathy CV: RRR  Chest: CTAB, no wheeze or crackles. Abd: Soft. obese. ND. Mild epigastric tenderness, mild Right lower quadrant tenderness to deep palpation. BS present. no Masses palpated. No rebound or guarding.  Skin: no rashes, purpura or  petechiae.  Neuro: Normal gait. PERLA. EOMi. Alert. Oriented x3  Psych: Normal affect, dress and demeanor. Normal speech. Normal thought content and judgment.  Urinalysis    Component Value Date/Time   BILIRUBINUR negative 01/30/2016 1607   PROTEINUR negative 01/30/2016 1607   UROBILINOGEN 0.2 01/30/2016 1607   NITRITE negative 01/30/2016 1607   LEUKOCYTESUR Negative 01/30/2016 1607    Assessment/Plan: Anita Black is a 35 y.o. female present for acute OV for  Nausea and vomiting - H. pylori antibody,CMP, lipase, cbc normal.  - POCT HgB A1C 6.0 , well controlled. Considered gastroparesis as potential cause. - POCT urine pregnancy--> negative - POCT Urinalysis Dipstick (Automated)-->  negative - abd [place] ordered today - neurology did not see her, but did not think it would be caused from MS or meds. Consider MRI. - No obvious cause on ultrasound, will place referral urgent referral to gastroenterology considering alarm symptoms with weight loss and chronic nature of nausea. - considered lithium level, however symptoms proceeded start of lithium.  - Start probiotics. Align samples, script provided.  - Start omeprazole 40 mg daily - F/U 4 weeks, unless referral to specialist is needed and scheduled prior.   > 25 minutes spent with patient, >50% of time spent face to face counseling patient and coordinating care.   electronically signed by:  Felix Pacini, DO  [facility] [place]

## 2016-02-06 NOTE — Patient Instructions (Signed)
Abdominal US ordered today, they will call you to schedule.  Start omeprazole today at least 4 weeks.  Start ALign for 12 weeks.  Follow up in 4 weeks, unless we end up referring you to specialist and you are seen before 4 weeks.

## 2016-02-07 ENCOUNTER — Ambulatory Visit (HOSPITAL_BASED_OUTPATIENT_CLINIC_OR_DEPARTMENT_OTHER)
Admission: RE | Admit: 2016-02-07 | Discharge: 2016-02-07 | Disposition: A | Discharge status: Home / Self Care | Payer: BC Managed Care – PPO | Source: Ambulatory Visit | Attending: Family Medicine | Admitting: Family Medicine

## 2016-02-07 DIAGNOSIS — K760 Fatty (change of) liver, not elsewhere classified: Secondary | ICD-10-CM | POA: Insufficient documentation

## 2016-02-07 DIAGNOSIS — R1013 Epigastric pain: Secondary | ICD-10-CM | POA: Insufficient documentation

## 2016-02-07 DIAGNOSIS — R634 Abnormal weight loss: Secondary | ICD-10-CM

## 2016-02-07 DIAGNOSIS — I77811 Abdominal aortic ectasia: Secondary | ICD-10-CM | POA: Diagnosis not present

## 2016-02-07 DIAGNOSIS — R110 Nausea: Secondary | ICD-10-CM

## 2016-02-09 ENCOUNTER — Telehealth: Payer: Self-pay | Admitting: Family Medicine

## 2016-02-09 DIAGNOSIS — R110 Nausea: Secondary | ICD-10-CM

## 2016-02-09 DIAGNOSIS — I77819 Aortic ectasia, unspecified site: Secondary | ICD-10-CM

## 2016-02-09 DIAGNOSIS — R634 Abnormal weight loss: Secondary | ICD-10-CM

## 2016-02-09 NOTE — Telephone Encounter (Signed)
Please call pt: - Her abd [place] did not show cause of her nausea.  - I have placed an urgent referral to GI.

## 2016-02-09 NOTE — Telephone Encounter (Signed)
Patient notified, expressed understanding.  

## 2016-02-23 ENCOUNTER — Encounter: Payer: Self-pay | Admitting: Gastroenterology

## 2016-03-01 ENCOUNTER — Other Ambulatory Visit: Payer: Self-pay | Admitting: *Deleted

## 2016-03-01 MED ORDER — METFORMIN HCL 500 MG PO TABS: ORAL_TABLET | ORAL | 0 refills | Status: DC

## 2016-03-17 ENCOUNTER — Encounter: Payer: Self-pay | Admitting: Neurology

## 2016-03-18 ENCOUNTER — Encounter: Payer: Self-pay | Admitting: Neurology

## 2016-03-18 ENCOUNTER — Ambulatory Visit (INDEPENDENT_AMBULATORY_CARE_PROVIDER_SITE_OTHER): Payer: BC Managed Care – PPO | Admitting: Neurology

## 2016-03-18 VITALS — BP 110/84 | HR 72 | Resp 14 | Ht 73.0 in | Wt 238.5 lb

## 2016-03-18 DIAGNOSIS — G43009 Migraine without aura, not intractable, without status migrainosus: Secondary | ICD-10-CM

## 2016-03-18 DIAGNOSIS — F329 Major depressive disorder, single episode, unspecified: Secondary | ICD-10-CM

## 2016-03-18 DIAGNOSIS — G35 Multiple sclerosis: Secondary | ICD-10-CM | POA: Diagnosis not present

## 2016-03-18 DIAGNOSIS — M542 Cervicalgia: Secondary | ICD-10-CM | POA: Diagnosis not present

## 2016-03-18 DIAGNOSIS — F32A Depression, unspecified: Secondary | ICD-10-CM

## 2016-03-18 NOTE — Progress Notes (Signed)
[facility]  PATIENT: Anita Black DOB: Jul 18, 1980     HISTORICAL  CHIEF COMPLAINT:  Chief Complaint  Patient presents with  . Multiple Sclerosis    Sts. she continues to tolerate Tysabri well.  JCV ab last checked 12-30-15 and was negative at 0.08.  Today she is here with c/o increased h/a's over the last  month.  Same severity.  Sts. Imitrex does not help.  Toradol sometimes does.  She is currently being treated for a peptic ulcer.  Toradol helped yesterday, but h/a returned today, although less severe.  Sts. she has had good relief with OCB in the past and would like one today if appropriate./fim    HISTORY OF PRESENT ILLNESS:   Anita Black is a 35 year old woman with MS , headaches and depression.  MS:   She is on Tysabri 300 mg IV monthly and tolerates it well. She has not had any recent MS exacerbation. Her JCV antibody was checked 2 months ago and was negative at 0.10.    No new symptoms  Headaches/neck pain:     Migraines are worse again. They're now occurring almost every day.  When she gets a headache she has been taking the shots of Toradol. Recently she has had a peptic ulcer that is being treated. In the past, she also received a lot of benefit from occipital nerve blocks/splenius capitis muscle injections. The current flareup of migraines, Topamax seemed to have helped with the control.   Gait/strength/sensation: She feels gait is doing well and balance is good most of the time. However she is feeling weak in her arms with some fluctuation.   She gets leg dysesthesias intermittently .  Legs cramp at night (calves mostly) 6-7 nights a week.                    Bladder/bowel: She denies any bladder dysfunction. She has 3 x nocturia,     She has not had any recent bowel incontinence  Vision: She has had optic neuritis involving each eye. When tired, vision seems more blurry.  VA is 20/30 out of either eye.   She has not had  diplopia.  Fatigue/sleep: Fatigue is more of a problem, even when not in the heat.   Cognitive fatigue is helped by [name] more than physical.   She feels apathetic at times.. Amantadine had not helped in the past.  mmmmmmmm   Sleep onset is easy and she has less insomnia now.    She snores but has never been noted to have pauses in her breathing or gasping.   She wakes up with nocturia but falls back asleep in 5-0 minutes,     Mood/social:   Mood is doing better on combination of Trintellix and Lithium (better than with [name]).   She is no longer having frequent spells of tearfulness.   She sees Dr. Jennelle Human once a month.              Cognition: Cognition has been a problem .   She notes reduced STM and focus is reduced.  . She also  notes difficulty with verbal fluency.   MS History:  She was diagnosed with multiple sclerosis in 2009 after presenting with numbness in the arms and torso, primarily in the front of her body. An MRI of the brain was normal but an MRI of the spinal cord showed 2 cervical plaques. She was started on Rebif but had difficulty tolerating the medication and  after about a year switched to Copaxone. Unfortunately, she also had trouble tolerating Copaxone. She then was on Copaxone and had injeciotn reactions and switched to Avonex.  After her pregnanacy, she was switched to Gilenya. However, she had bradycardia and that was discontinued. In January 2015 she started Tysabri and continues on that medication. [place] in July [place]. She continues her Tysabri treatments here.     She is JCV antibody negative. She feels better with the Tysabri infusions in general. However, she gets nausea that lasts several days after each infusion.   MRI Brain from 07/01/14 shows about 10-12 hyperintense foci consistent with MS. No lesions were seen in the posterior fossa.    ROS:  Out of a complete 14 system review of symptoms, the patient complains only of the following symptoms,  and all other reviewed systems are negative.   She notes fatigue and depression.   She has headaches, anddifficulty with attention, Snoring, rare bowel incontinence   ALLERGIES: No Known Allergies  HOME MEDICATIONS:  Current Outpatient Prescriptions:  .  alprazolam (XANAX) 2 MG tablet, Take 2 mg by mouth 3 (three) times daily as needed. , Disp: , Rfl:  .  amphetamine-dextroamphetamine (ADDERALL) 10 MG tablet, Take one tablet daily in the afternoon, Disp: 30 tablet, Rfl: 0 .  BD SAFETYGLIDE SHIELDED NEEDLE 25G X 1" MISC, See admin instructions., Disp: , Rfl: 11 .  glycopyrrolate (ROBINUL) 2 MG tablet, Take 1 tablet (2 mg total) by mouth 2 (two) times daily., Disp: 60 tablet, Rfl: 11 .  ketorolac (TORADOL) 30 MG/ML injection, One cc IM prn migraine.  No more than 2/day Dispense with 8 (eight)    3cc syringes with 1 inch 25g needles, Disp: 8 mL, Rfl: 11 .  lisdexamfetamine ([name]) 60 MG capsule, Take 1 capsule (60 mg total) by mouth every morning., Disp: 30 capsule, Rfl: 0 .  lithium carbonate 300 MG capsule, TAKE 1 CAPSULE BY MOUTH IN THE EVENING FOR 5 DAYS THEN INCREASE TO 2 CAPS IN EVENING, Disp: , Rfl: 1 .  LORYNA 3-0.02 MG tablet, TAKE ONE TABLET EACH DAY, Disp: , Rfl: 3 .  metFORMIN (GLUCOPHAGE) 500 MG tablet, 1000 mg BID with meals. Needs appt prior to anymore refills., Disp: 120 tablet, Rfl: 0 .  natalizumab (TYSABRI) 300 MG/15ML injection, Inject into the vein. Every 28 days, Disp: , Rfl:  .  ondansetron (ZOFRAN) 4 MG tablet, Take 1 tablet (4 mg total) by mouth every 8 (eight) hours as needed for nausea or vomiting., Disp: 30 tablet, Rfl: 0 .  promethazine (PHENERGAN) 25 MG tablet, Take 1 tablet (25 mg total) by mouth every 6 (six) hours as needed for nausea or vomiting., Disp: 12 tablet, Rfl: 0 .  SUMAtriptan (IMITREX) 100 MG tablet, Take 1 tablet (100 mg total) by mouth once as needed for migraine. May repeat in 2 hours if headache persists or recurs., Disp: 10 tablet, Rfl: 5 .   topiramate (TOPAMAX) 100 MG tablet, Take 1 tablet (100 mg total) by mouth 2 (two) times daily., Disp: 60 tablet, Rfl: 11 .  TRINTELLIX 20 MG TABS, Take 20 mg by mouth daily., Disp: 30 tablet, Rfl: 5 .  metoCLOPramide (REGLAN) 10 MG tablet, , Disp: , Rfl:  .  omeprazole (PRILOSEC) 40 MG capsule, Take 1 capsule (40 mg total) by mouth daily. (Patient not taking: Reported on 03/18/2016), Disp: 30 capsule, Rfl: 1 .  pantoprazole (PROTONIX) 40 MG tablet, , Disp: , Rfl:  .  Probiotic Product (ALIGN) 4 MG CAPS, Take 1 capsule (4 mg total) by mouth daily. (Patient not taking: Reported on 03/18/2016), Disp: 90 capsule, Rfl: 0 .  sucralfate (CARAFATE) 1 g tablet, , Disp: , Rfl:   PAST MEDICAL HISTORY: Past Medical History:  Diagnosis Date  . Anxiety   . Depression   . Diabetes type 2, controlled (HCC)   . Elevated hemoglobin A1c   . Hyperlipidemia   . Migraine   . Multiple sclerosis (HCC)    receives Tysabri infusion q 28 days    PAST SURGICAL HISTORY: Past Surgical History:  Procedure Laterality Date  . ABDOMINOPLASTY    . CESAREAN SECTION    . CHOLECYSTECTOMY  2001    FAMILY HISTORY: Family History  Problem Relation Age of Onset  . Diabetes Mother   . Hypertension Mother   . Dementia Mother   . Bipolar disorder Mother   . Heart failure Father   . Hypertension Father   . Heart disease Father   . Ovarian cancer Maternal Grandmother   . Alzheimer's disease Paternal Grandmother   . Skin cancer Maternal Grandfather     SOCIAL HISTORY:  Social History   Social History  . Marital status: Married    Spouse name: Rollen Sox  . Number of children: 1  . Years of education: MA   Occupational History  .  [employer]   Social History Main Topics  . Smoking status: Never Smoker  . Smokeless tobacco: Never Used  . Alcohol use Yes     Comment: socially; once a week  . Drug use: No  . Sexual activity: Yes   Other Topics Concern  . Not on file   Social History Narrative    Patient lives at home with family.   Caffeine Use: 1-2 cups daily   No tobacco, recreational drugs. Social drinking only.   Wears her seatbelt, has smoke alarm in the home.   Feels safe in her relationships.     PHYSICAL EXAM  Vitals:   03/18/16 1019  BP: 110/84  Pulse: 72  Resp: 14  Weight: 238 lb 8 oz (108.2 kg)  Height: 6\' 1"  (1.854 m)    Body mass index is 31.47 kg/m.   General: The patient is well-developed and well-nourished and in no acute distress  Neck:   She is now tender over the splenius capitus muscles and occipital nerves.   Good ROM in neck  Neurologic Exam  Mental status: The patient is alert and oriented x 3 at the time of the examination. The patient has apparent normal recent and remote memory, with an apparently normal attention span and concentration ability.   Speech is normal.  Cranial nerves: Extraocular movements are full.  Facial symmetry is present. There is good facial sensation to soft touch bilaterally.Facial strength is normal.  Trapezius and sternocleidomastoid strength is normal. No dysarthria is noted. No obvious hearing deficits are noted.  Motor:  Muscle bulk is normal.   Tone is normal. Strength is  5 / 5 in all 4 extremities.   Sensory: Sensory testing is intact to  Touch sensation in all 4 extremities.  Coordination: Cerebellar testing reveals good finger-nose-finger bilaterally.  Gait and station: Station is normal.   Gait is normal today. Tandem gait is mildly wide. Romberg is negative.   Reflexes: Deep tendon reflexes are symmetric and 1+ bilaterally.     DIAGNOSTIC DATA (LABS, IMAGING, TESTING) - I reviewed patient records, labs, notes, testing and imaging myself where available.  Lab Results  Component Value Date   WBC 7.8 12/29/2015   HGB 12.1 05/01/2015   HCT 37.9 12/29/2015   MCV 84 12/29/2015   PLT 250 12/29/2015      Component Value Date/Time   NA 139 01/30/2016 1555   NA 137 04/15/2014 1554   K 4.1  01/30/2016 1555   CL 106 01/30/2016 1555   CO2 20 01/30/2016 1555   GLUCOSE 106 (H) 01/30/2016 1555   BUN 14 01/30/2016 1555   BUN 15 04/15/2014 1554   CREATININE 0.90 01/30/2016 1555   CALCIUM 9.9 01/30/2016 1555   PROT 6.8 01/30/2016 1555   PROT 6.9 12/29/2015 1400   ALBUMIN 4.2 01/30/2016 1555   ALBUMIN 4.5 12/29/2015 1400   AST 13 01/30/2016 1555   ALT 18 01/30/2016 1555   ALKPHOS 94 01/30/2016 1555   BILITOT 0.6 01/30/2016 1555   BILITOT 0.6 12/29/2015 1400   GFRNONAA 102 04/15/2014 1554   GFRAA 117 04/15/2014 1554       ASSESSMENT AND PLAN  MS (multiple sclerosis) (HCC)  Neck pain  Common migraine without intractability  Depression, unspecified depression type    1.  Bilateral splenius capitis muscle trigger point injection with 80 mg Depo-Medrol in Marcaine using sterile technique. She tolerated the procedure well. She will discuss the Toradol injections with her GI doctor before reinstating them as they might worsen PUD.  That they are contraindicated we will need to consider a different breakthrough medication. 2.  She will continue to see Dr. Jennelle Human for her psychologic issues. 3.  Continue topirmate 200 mg daily and Imitrex when necessary. 4.  Return to clinic as scheduled months for a regular follow-up visit or call sooner if new or worsening neurologic symptoms.  [name]. Epimenio Foot, MD, PhD 03/18/2016, 11:00 AM Certified in Neurology, Clinical Neurophysiology, Sleep Medicine, Pain Medicine and Neuroimaging  [facility] [address], [address] [place], [place] [address] [phone] [[k

## 2016-04-26 ENCOUNTER — Ambulatory Visit: Payer: BC Managed Care – PPO | Admitting: Gastroenterology

## 2016-05-05 ENCOUNTER — Encounter: Payer: Self-pay | Admitting: Neurology

## 2016-05-21 ENCOUNTER — Encounter (HOSPITAL_COMMUNITY): Payer: Self-pay

## 2016-05-21 ENCOUNTER — Ambulatory Visit (HOSPITAL_COMMUNITY)
Admission: RE | Admit: 2016-05-21 | Discharge: 2016-05-21 | Disposition: A | Discharge status: Home / Self Care | Payer: BC Managed Care – PPO | Source: Ambulatory Visit | Attending: Obstetrics & Gynecology | Admitting: Obstetrics & Gynecology

## 2016-05-21 DIAGNOSIS — Z7189 Other specified counseling: Secondary | ICD-10-CM | POA: Diagnosis present

## 2016-05-21 DIAGNOSIS — E119 Type 2 diabetes mellitus without complications: Secondary | ICD-10-CM | POA: Diagnosis present

## 2016-05-21 DIAGNOSIS — F329 Major depressive disorder, single episode, unspecified: Secondary | ICD-10-CM | POA: Insufficient documentation

## 2016-05-21 DIAGNOSIS — Z3169 Encounter for other general counseling and advice on procreation: Secondary | ICD-10-CM

## 2016-05-21 DIAGNOSIS — O09529 Supervision of elderly multigravida, unspecified trimester: Secondary | ICD-10-CM

## 2016-05-21 DIAGNOSIS — G35 Multiple sclerosis: Secondary | ICD-10-CM | POA: Insufficient documentation

## 2016-05-21 DIAGNOSIS — O24019 Pre-existing diabetes mellitus, type 1, in pregnancy, unspecified trimester: Secondary | ICD-10-CM

## 2016-05-21 NOTE — Progress Notes (Signed)
MFM Consultation, Staff Note:   1. DM:  I discussed the maternal risks of pregnancy with underlying diabetes. With regard to the impact of pregnancy on maternal disease, I told her that patients with underlying vasculopathy, especially of the retina and kidneys, appear to fare much worse during pregnancy than women without underlying vascular disease. Background retinopathy does not seem to be a significant risk factor, although proliferative retinopathy and nephropathy clearly place patients at increased risk, both for poor perinatal outcome and for progression of eye or kidney disease. I told her that obstetric complications such as pre-eclampsia, sometimes severe and early-onset, are seen with increased frequency in women with insulin-dependent diabetes.  With regard to fetal risk, I detailed the first trimester risks, including miscarriage and fetal malformation. I told your patient that the miscarriage rate among diabetics appears to be around double that of the non-diabetic population, and that miscarriage risk is most closely related to the degree of glycemic control at conception. Similarly, the fetal malformation rate is increased in women with diabetes. With tight preconceptional control, however, the rate of structural malformation appears to be about the same as for the non-diabetic population, approximately two percent. With moderate preconceptional control, the rate appears to be around five percent. With poor control, the malformation rate is around ten percent or more. A hemoglobin A1c value from the first trimester is a reasonably good indicator of early glycemic control; HgbA1c values below about 6% are generally associated with a low risk of fetal malformations.  Her HgbA1c 6.7% today (verbal report by patient from today's Endocrinology visit) is suboptimal and does place her at increased risk for both miscarriage and structural defects.  I suggested that ideally she defers conception until  she achieves 6% or below but risks of conception now are ultimately her choice.  To attempt to further decrease risk of ONTD (eg, spina bifida), I recommend folic acid 4mg  po qd in addition to starting prenatal vitamin daily now.  We discussed the second- and third-trimester risk of abnormal fetal growth, including both growth restriction related to placental insufficiency and macrosomia due to maternal-fetal hyperglycemia. We also recognized oligohydramnios, stillbirth, fetal distress, meconium, etc., as potential complications. I also went over the newborn metabolic derangements that can occur with maternal diabetes, including delayed pulmonary maturity and hypoglycemia.  We talked about the tightness of glycemic control that is recommended before and during pregnancy. I reviewed the usual goals of achieving a HgbA1c value 6% or below. I detailed the blood sugar targets of having fasting values in the 70-95 mg/dl range, and postprandial values under 120 mg/dl at two hours. I emphasized the time and effort required to achieve tight control in pregnancy, including the meticulous fingerstick monitoring; close adherence to diet an exercise program; and, compliance with obstetric visits and scheduled fetal testing. We discussed that she is seeing her diabetic educator, noting we recommend review at least every two weeks in gestation.    2. Multiple sclerosis (MS) involves an immune-mediated process directed against the central nervous system (CNS). The exact antigen - or target that the immune cells are sensitized to attack - remains unknown, which is why MS is considered by many experts to be "immune-mediated" rather than "autoimmune." Within the CNS, the immune system attacks myelin - the fatty substance that surrounds and insulates the nerve fibers - as well as the nerve fibers themselves. The damaged myelin forms scar tissue (sclerosis), which gives the disease its name.  When any part of the myelin sheath  or  nerve fiber is damaged or destroyed, nerve impulses traveling to and from the brain and spinal cord are distorted or interrupted, producing a wide variety of symptoms.  With respect to her multiple sclerosis, the patient reports excellent control on Tysabri. Patients who are well-controlled prior to pregnancy tend to remain in good control throughout there pregnancies provided their medications are continued. There is no known teratogenic effect of Tysabri.  However, administration close to delivery in the third trimester or in breast-feeding mothers carries a risk for neonatal immune suppression.  While she should be continued through the preconception, first trimester, and second trimester, continuation in the third trimester should be carefully thought out depending on expected time of delivery.  It appears the medication lasts for 4 weeks given the dosing interval and I would expect a timed 39 week delivery for diabetes, provided there is no preterm labor, no preeclampsia, normal growth and normal testing.  Hence any dosing in the third trimester will need to be carefully planned in concert with OB, maternal fetal medicine and her provider administering the Tysabri.  3. Depression: Given that she has a history of depression and is on trintallix, I recommend that she continue the medication preconception, during pregnancy and thereafter.  Although there may be some risk to fetus, the risks of untreated depression are potentially grave.  Patient aware of signs and currently doing well.  As each case should be individualized, she was counseled that there is little data on the safety of Trintallix use in pregnancy, however, the benefit from continuing on a regimen that provides her stable psychiatric control may, in this case, outweigh the potential (unknown) theoretical risk to the fetus. These risks include but are not limited to neonatal abstinence syndrome and persistent pulmonary hypertension of the newborn.  She was advised as such.   The limited data of fetal exposure to Trintallix does not suggest an increased risk of fetal anomalies or adverse pregnancy events. She was encouraged to continue this medication during pregnancy. The majority of episodes of depressive disorder during pregnancy tend to increase and are more likely to recur with subsequent pregnancies. There also is an increased risk of postpartum psychosis as high as 46%. The risk-benefit ratio of continuing medication for control of psychiatric disorders should be weighed against the potential and/or theoretical risk to the fetus.  As maternal psychiatric illness, if inadequately treated or untreated, may result in poor compliance with prenatal care, inadequate nutrition, exposure to additional medication or herbal remedies, increased alcohol and tobacco use, deficits in mother-infant bonding, and disruptions within the family environment.  In my review of her history and patient verbalization of her condition, it appears that discontinuation of the medication places both her and her baby at risk so I would recommend continuation.    I gave the following recommendations to your patient:    1. Continue metformin 2. Prenatal vitamin + 4mg  folic acid daily to start now 3. continue to adjust oral hypoglycemic therapy every 2-4 weeks as indicated by glycemic measures, noting a goal of HgbA1c ~6% or less pre-conception.   4. Ultimately, she may need to start insulin before/during pregnancy. 5. Also, she should have a retinal exam  6. Early dating ultrasound upon conception 7. Offer first trimester screen 8. At 18 weeks, she should have a targeted ultrasound exam to most fully evaluate fetal anatomy along with a formal fetal echocardiogram should be obtained.  9. Beginning at 22 weeks, monthly ultrasounds for fetal growth are  recommended.  10. She will also need twice weekly non-stress tests beginning at 30-[redacted] weeks along with weekly AFI.  11. I  recommend HgbA1c surveillance every 4-6 weeks of gestation.  12. Continuation of Tysabri until 3rd trimester; ie, may consider continuation until up to 4 weeks prior to anticipated delivery 13. If Anita Black is used postpartum, the patient should not breast feed (risk of neonatal immune suppression.  Formula for the infant would be a safe alternative to enable the mom to receive her medication as she is at risk for MS flare postpartum.  This is my recommendation as opposed to withholding tysabri to breast-feed.  That being said, if she is motivated for breast-feeding and accepts risk of MS flare with medication discontinuation then that is her choice to make. 14. Continuation of the trintallix. 15. Finally, assuming all goes well (ie, good glycemic control, AGA growth and reassuring testing), she should expect to be delivered by or shortly after 38-39 weeks, if spontaneous labor does not occur beforehand.   Thank you for consultation. It was a pleasure having the opportunity to contribute to the care of your patient. Please page with questions. I spent in excess of 60 minutes in consultation with your patient with more than 50% of this time in direct face-to-face counseling and education.   Thank you,   Louann Sjogren Gaynelle Arabian, Louann Sjogren, MD, MS, FACOG Assistant Professor Section of Maternal-Fetal Medicine

## 2016-05-21 NOTE — ED Notes (Signed)
Pt BP 110/78, pulse 87, weight 244 lb.  Pt in with Dr. Marjo Bicker.

## 2016-06-16 ENCOUNTER — Telehealth: Payer: Self-pay | Admitting: Neurology

## 2016-06-16 NOTE — Telephone Encounter (Signed)
Message printed and given to Tina in the infusion suite/fim 

## 2016-06-16 NOTE — Telephone Encounter (Signed)
Thurston Hole with [employer] is calling regarding form send to them for natalizumab (TYSABRI) 300 MG/15ML injection for the patient. She needs to discuss question G. She can be reached at [phone] Option 2.

## 2016-06-17 ENCOUNTER — Ambulatory Visit (INDEPENDENT_AMBULATORY_CARE_PROVIDER_SITE_OTHER): Payer: Managed Care, Other (non HMO) | Admitting: Family Medicine

## 2016-06-17 ENCOUNTER — Encounter: Payer: Self-pay | Admitting: Family Medicine

## 2016-06-17 VITALS — BP 114/80 | HR 98 | Temp 98.3°F | Resp 20 | Wt 253.5 lb

## 2016-06-17 DIAGNOSIS — B001 Herpesviral vesicular dermatitis: Secondary | ICD-10-CM | POA: Diagnosis not present

## 2016-06-17 DIAGNOSIS — E119 Type 2 diabetes mellitus without complications: Secondary | ICD-10-CM | POA: Diagnosis not present

## 2016-06-17 LAB — POCT GLYCOSYLATED HEMOGLOBIN (HGB A1C): HEMOGLOBIN A1C: 6.4

## 2016-06-17 MED ORDER — METFORMIN HCL 500 MG PO TABS: ORAL_TABLET | ORAL | 5 refills | Status: DC

## 2016-06-17 MED ORDER — VALACYCLOVIR HCL 1 G PO TABS
ORAL_TABLET | ORAL | 3 refills | Status: DC
Start: 2016-06-17 — End: 2023-12-26

## 2016-06-17 NOTE — Patient Instructions (Signed)
Valtrex called in 2 pills every 12 hours (2 doses only for tx). Continue metformin, refills today. Exercise and diet modification to help with control. Close follow with OB if becoming pregnant.    Follow up here in 4 months for diabetes.

## 2016-06-17 NOTE — Progress Notes (Signed)
Anita Black , [birth year], 36 y.o., female MRN: [id number] Patient Care Team    Relationship Specialty Notifications Start End  Natalia Leatherwood, DO PCP - General Family Medicine  02/26/15   Mitchel Honour, DO Consulting Physician Obstetrics and Gynecology  06/18/16     CC: mouth lesions Subjective:   Mouth lesions: Pt presents for an acute OV with complaints of cold sore of 1 day duration.  Associated symptoms include nothing. She states she has had cold sores in the past, but has run out of valtrex.   Diabetes: Pt states she is going to start and try to conceive. She saw or OB last month, who tested he a1c and it was 6.7. She states her OB told her to follow PCP and try to get better control of her DM if trying to conceive. She reports compliance with metformin 1000 mg BID. She has tried to increase her exercise and watch her diet more closely. She had to take both glyburide and metformin during her first pregnancy. She states she has had fasting glucose as high as 135 at home. Her a1c was 6.0 on 01/30/2016 here.  PNA series: will offer again  next follow for DM Flu shot: 2016 (recommneded yearly) BMP: Normal Foot exam: 2017 Eye exam: 2016 A1c: 6.0--> 6.4 today  No Known Allergies Social History  Substance Use Topics  . Smoking status: Never Smoker  . Smokeless tobacco: Never Used  . Alcohol use Yes     Comment: socially; once a week   Past Medical History:  Diagnosis Date  . Anxiety   . Depression   . Diabetes type 2, controlled (HCC)   . Elevated hemoglobin A1c   . Hyperlipidemia   . Migraine   . Multiple sclerosis (HCC)    receives Tysabri infusion q 28 days   Past Surgical History:  Procedure Laterality Date  . ABDOMINOPLASTY    . CESAREAN SECTION    . CHOLECYSTECTOMY  2001   Family History  Problem Relation Age of Onset  . Diabetes Mother   . Hypertension Mother   . Dementia Mother   . Bipolar disorder Mother   . Heart failure Father   . Hypertension  Father   . Heart disease Father   . Ovarian cancer Maternal Grandmother   . Alzheimer's disease Paternal Grandmother   . Skin cancer Maternal Grandfather    Allergies as of 06/17/2016   No Known Allergies     Medication List       Accurate as of 06/17/16 11:59 PM. Always use your most recent med list.          ALIGN 4 MG Caps Take 1 capsule (4 mg total) by mouth daily.   alprazolam 2 MG tablet Commonly known as:  XANAX Take 2 mg by mouth 3 (three) times daily as needed.   amphetamine-dextroamphetamine 10 MG tablet Commonly known as:  ADDERALL Take one tablet daily in the afternoon   BD SAFETYGLIDE SHIELDED NEEDLE 25G X 1" Misc Generic drug:  NEEDLE (DISP) 25 G See admin instructions.   glycopyrrolate 2 MG tablet Commonly known as:  ROBINUL Take 1 tablet (2 mg total) by mouth 2 (two) times daily.   ketorolac 30 MG/ML injection Commonly known as:  TORADOL One cc IM prn migraine.  No more than 2/day Dispense with 8 (eight)    3cc syringes with 1 inch 25g needles   lisdexamfetamine 60 MG capsule Commonly known as:  [name] Take 1 capsule (60  mg total) by mouth every morning.   lithium carbonate 300 MG capsule TAKE 1 CAPSULE BY MOUTH IN THE EVENING FOR 5 DAYS THEN INCREASE TO 2 CAPS IN EVENING   LORYNA 3-0.02 MG tablet Generic drug:  drospirenone-ethinyl estradiol TAKE ONE TABLET EACH DAY   metFORMIN 500 MG tablet Commonly known as:  GLUCOPHAGE 1000 mg BID with meals. Needs appt prior to anymore refills.   metoCLOPramide 10 MG tablet Commonly known as:  REGLAN   natalizumab 300 MG/15ML injection Commonly known as:  TYSABRI Inject into the vein. Every 28 days   omeprazole 40 MG capsule Commonly known as:  PRILOSEC Take 1 capsule (40 mg total) by mouth daily.   ondansetron 4 MG tablet Commonly known as:  ZOFRAN Take 1 tablet (4 mg total) by mouth every 8 (eight) hours as needed for nausea or vomiting.   pantoprazole 40 MG tablet Commonly known as:   PROTONIX   promethazine 25 MG tablet Commonly known as:  PHENERGAN Take 1 tablet (25 mg total) by mouth every 6 (six) hours as needed for nausea or vomiting.   sucralfate 1 g tablet Commonly known as:  CARAFATE   topiramate 100 MG tablet Commonly known as:  TOPAMAX Take 1 tablet (100 mg total) by mouth 2 (two) times daily.   TRINTELLIX 20 MG Tabs Generic drug:  vortioxetine HBr Take 20 mg by mouth daily.   valACYclovir 1000 MG tablet Commonly known as:  VALTREX 2000 mg every 12 hours for 2 doses.       No results found for this or any previous visit (from the past 24 hour(s)). No results found.   ROS: Negative, with the exception of above mentioned in HPI   Objective:  BP 114/80 (BP Location: Right Arm, Patient Position: Sitting, Cuff Size: Large)   Pulse 98   Temp 98.3 F (36.8 C)   Resp 20   Wt 253 lb 8 oz (115 kg)   LMP 06/01/2016   SpO2 99%   BMI 33.45 kg/m  Body mass index is 33.45 kg/m. Gen: Afebrile. No acute distress. Nontoxic in appearance, well developed, well nourished. Obese  HENT: AT. [place]. MMM, small erythremic lesion midline lower lip.  Eyes:Pupils Equal Round Reactive to light, Extraocular movements intact,  Conjunctiva without redness, discharge or icterus. CV: RRR, equal pulses bilateral PT Chest: CTAB, no wheeze or crackles. Good air movement, normal resp effort.  Abd: Soft. NTND. BS present.  Skin: no rashes, purpura or petechiae.  Neuro: Normal gait. PERLA. EOMi. Alert. Oriented x3 t.  Assessment/Plan: Anita Black is a 36 y.o. female present for OV for  Cold sore - valACYclovir (VALTREX) 1000 MG tablet; 2000 mg every 12 hours for 2 doses.  Dispense: 8 tablet; Refill: 3  Controlled type 2 diabetes mellitus without complication, without long-term current use of insulin (HCC) - pt a1c in our office is 6.4, she is compliant with metformin. She has good control. I would not add any medications to her regimen today. - She is to continue  diet and exercise modifications. PNA series: will offer again  next followup for DM Flu shot: 2016 (recommneded yearly) BMP: Normal Foot exam: 2017 Eye exam: 2016 A1c: 6.0--> 6.4 today - Follow with OB for management if conceives.Otherwise, would want to see every 4 months for DM.     electronically signed by:  Felix Pacini, DO   Primary Care - [place]

## 2016-06-18 DIAGNOSIS — B001 Herpesviral vesicular dermatitis: Secondary | ICD-10-CM | POA: Insufficient documentation

## 2016-06-25 ENCOUNTER — Telehealth: Payer: Self-pay | Admitting: Family Medicine

## 2016-06-25 ENCOUNTER — Encounter: Payer: Self-pay | Admitting: Family Medicine

## 2016-06-25 DIAGNOSIS — F339 Major depressive disorder, recurrent, unspecified: Secondary | ICD-10-CM

## 2016-06-25 MED ORDER — TRINTELLIX 20 MG PO TABS: 20.0000 mg | ORAL_TABLET | Freq: Every day | ORAL | 5 refills | Status: DC

## 2016-06-25 NOTE — Telephone Encounter (Signed)
**  Remind patient they can make refill requests via MyChart**  Medication refill request (Name & Dosage): Trintellix 20 mg     Preferred pharmacy (Name & Address): [facility]     Other comments (if applicable): Sent mychart message earlier today.

## 2016-06-25 NOTE — Telephone Encounter (Signed)
Please call pt: - she is requesting her depression medication be refilled. She was in last week for other issues, we did not discuss her depression. She did however say she was not taking the medication on medication review by nurse.  -  I am willing to fill for 3 months, at that time she will be due for the DM recheck and we can cover her depression at that visit as well as long as doing well.

## 2016-06-25 NOTE — Telephone Encounter (Signed)
Spoke with patient reviewed information. Patient verbalized understanding 

## 2016-06-30 ENCOUNTER — Encounter: Payer: Self-pay | Admitting: Neurology

## 2016-06-30 ENCOUNTER — Ambulatory Visit (INDEPENDENT_AMBULATORY_CARE_PROVIDER_SITE_OTHER): Payer: Managed Care, Other (non HMO) | Admitting: Neurology

## 2016-06-30 VITALS — BP 101/71 | HR 86 | Resp 18 | Ht 73.0 in | Wt 249.0 lb

## 2016-06-30 DIAGNOSIS — R5383 Other fatigue: Secondary | ICD-10-CM | POA: Diagnosis not present

## 2016-06-30 DIAGNOSIS — Z79899 Other long term (current) drug therapy: Secondary | ICD-10-CM

## 2016-06-30 DIAGNOSIS — F329 Major depressive disorder, single episode, unspecified: Secondary | ICD-10-CM | POA: Diagnosis not present

## 2016-06-30 DIAGNOSIS — G43009 Migraine without aura, not intractable, without status migrainosus: Secondary | ICD-10-CM

## 2016-06-30 DIAGNOSIS — F32A Depression, unspecified: Secondary | ICD-10-CM

## 2016-06-30 DIAGNOSIS — G35 Multiple sclerosis: Secondary | ICD-10-CM | POA: Diagnosis not present

## 2016-06-30 MED ORDER — RIZATRIPTAN BENZOATE 10 MG PO TBDP: 10.0000 mg | ORAL_TABLET | ORAL | 11 refills | Status: AC | PRN

## 2016-06-30 MED ORDER — KETOROLAC TROMETHAMINE 30 MG/ML IJ SOLN: INTRAMUSCULAR | 11 refills | Status: DC

## 2016-06-30 MED ORDER — AMPHETAMINE-DEXTROAMPHETAMINE 10 MG PO TABS: ORAL_TABLET | ORAL | 0 refills | Status: DC

## 2016-06-30 NOTE — Addendum Note (Signed)
Addended by: Asa Lente on: 06/30/2016 05:44 PM   Modules accepted: Level of Service

## 2016-06-30 NOTE — Progress Notes (Addendum)
[facility]  PATIENT: Anita Black DOB: Nov 11, 1980     HISTORICAL  CHIEF COMPLAINT:  Chief Complaint  Patient presents with  . Multiple Sclerosis    Sts. she continues to tolerate Tysabri well.  JCV ab last checked 12-30-15 and was negative at 0.08.  She has stopped Topamax and most  meds for mood/depression, in order to try and get pregnant.  Denies increase in h/a's.  "I'm going thru a really good period right now."/fim    HISTORY OF PRESENT ILLNESS:   Anita Black is a 36 year old woman with MS , headaches and depression.  MS:  She feels her MS is mostly stable.   She is on Tysabri 300 mg IV monthly and tolerates it well. She denies any MS exacerbation. Her JCV antibody was checked 2 months ago and was negative at 0.10.   She is planning to have a child soon.  I recommended that she check a pregnancy test before each Tysabri infusion and call us if positive.    Headaches/neck pain:   She gets bifrontal and bi-occipital headache.    The splenius capitus TPI helped her a lot at the last visit.   They still occur about 4/month and she gets milder headaches twice weekly.    Toradol shots help the migraines when they occur.   Imitrex had not helped.  She stopped Topamax to conceive,  Gait/strength/sensation: She feels gait is doing well and balance is good most of the time. However she is feeling weak in her arms with some fluctuation.   She gets leg dysesthesias intermittently .  Legs cramp at night (calves mostly) 6-7 nights a week.                    Bladder/bowel: She denies any bladder dysfunction. She has 3 x nocturia,     She has not had any recent bowel incontinence  Vision: She has had optic neuritis involving each eye. When tired, vision seems more blurry.  VA is 20/30 out of either eye.   She has not had diplopia.  Fatigue/sleep: Fatigue is better since she quit working .   She also notes benefit of her fatigue with the [name]..   She feels less  apathetic .[name] Amantadine had not helped in the past.  Sleep onset is easy and she has less insomnia now.    She snores but has never been noted to have pauses in her breathing or gasping.   She wakes up with nocturia but falls back asleep in 5-0 minutes,     Mood/social:   She is much better than last year.  Mood is doing better and she is off lithium.    She is no longer having frequent spells of tearfulness.   She sees Dr. Jennelle Human once a month.              Cognition: Cognition has been a mild problem .   She notes reduced STM and focus is reduced.  . She also  notes difficulty with verbal fluency.   MS History:  She was diagnosed with multiple sclerosis in 2009 after presenting with numbness in the arms and torso, primarily in the front of her body. An MRI of the brain was normal but an MRI of the spinal cord showed 2 cervical plaques. She was started on Rebif but had difficulty tolerating the medication and after about a year switched to Copaxone. Unfortunately, she also had trouble tolerating Copaxone. She then  was on Copaxone and had injeciotn reactions and switched to Avonex.  After her pregnanacy, she was switched to Gilenya. However, she had bradycardia and that was discontinued. In January 2015 she started Tysabri and continues on that medication. [place] in July 2016. She continues her Tysabri treatments here.     She is JCV antibody negative. She feels better with the Tysabri infusions in general. However, she gets nausea that lasts several days after each infusion.   MRI Brain from 07/01/14 shows about 10-12 hyperintense foci consistent with MS. No lesions were seen in the posterior fossa.    ROS:  Out of a complete 14 system review of symptoms, the patient complains only of the following symptoms, and all other reviewed systems are negative.   She notes fatigue and depression.   She has headaches, anddifficulty with attention, Snoring, rare bowel  incontinence   ALLERGIES: No Known Allergies  HOME MEDICATIONS:  Current Outpatient Prescriptions:  .  amphetamine-dextroamphetamine (ADDERALL) 10 MG tablet, Take one tablet daily in the afternoon, Disp: 30 tablet, Rfl: 0 .  BD SAFETYGLIDE SHIELDED NEEDLE 25G X 1" MISC, See admin instructions., Disp: , Rfl: 11 .  ketorolac (TORADOL) 30 MG/ML injection, One cc IM prn migraine.  No more than 2/day Dispense with 8 (eight)    3cc syringes with 1 inch 25g needles, Disp: 8 mL, Rfl: 11 .  lisdexamfetamine ([name]) 60 MG capsule, Take 1 capsule (60 mg total) by mouth every morning., Disp: 30 capsule, Rfl: 0 .  LORYNA 3-0.02 MG tablet, TAKE ONE TABLET EACH DAY, Disp: , Rfl: 3 .  metFORMIN (GLUCOPHAGE) 500 MG tablet, 1000 mg BID with meals. Needs appt prior to anymore refills., Disp: 120 tablet, Rfl: 5 .  natalizumab (TYSABRI) 300 MG/15ML injection, Inject into the vein. Every 28 days, Disp: , Rfl:  .  promethazine (PHENERGAN) 25 MG tablet, Take 1 tablet (25 mg total) by mouth every 6 (six) hours as needed for nausea or vomiting., Disp: 12 tablet, Rfl: 0 .  topiramate (TOPAMAX) 100 MG tablet, Take 1 tablet (100 mg total) by mouth 2 (two) times daily., Disp: 60 tablet, Rfl: 11 .  TRINTELLIX 20 MG TABS, Take 20 mg by mouth daily., Disp: 30 tablet, Rfl: 5 .  valACYclovir (VALTREX) 1000 MG tablet, 2000 mg every 12 hours for 2 doses., Disp: 8 tablet, Rfl: 3 .  rizatriptan (MAXALT-MLT) 10 MG disintegrating tablet, Take 1 tablet (10 mg total) by mouth as needed for migraine. May repeat in 2 hours if needed, Disp: 9 tablet, Rfl: 11  PAST MEDICAL HISTORY: Past Medical History:  Diagnosis Date  . Anxiety   . Depression   . Diabetes type 2, controlled (HCC)   . Elevated hemoglobin A1c   . Hyperlipidemia   . Migraine   . Multiple sclerosis (HCC)    receives Tysabri infusion q 28 days    PAST SURGICAL HISTORY: Past Surgical History:  Procedure Laterality Date  . ABDOMINOPLASTY    . CESAREAN SECTION     . CHOLECYSTECTOMY  2001    FAMILY HISTORY: Family History  Problem Relation Age of Onset  . Diabetes Mother   . Hypertension Mother   . Dementia Mother   . Bipolar disorder Mother   . Heart failure Father   . Hypertension Father   . Heart disease Father   . Ovarian cancer Maternal Grandmother   . Alzheimer's disease Paternal Grandmother   . Skin cancer Maternal Grandfather  SOCIAL HISTORY:  Social History   Social History  . Marital status: Married    Spouse name: Rollen Sox  . Number of children: 1  . Years of education: MA   Occupational History  .  [employer]   Social History Main Topics  . Smoking status: Never Smoker  . Smokeless tobacco: Never Used  . Alcohol use Yes     Comment: socially; once a week  . Drug use: No  . Sexual activity: Yes   Other Topics Concern  . Not on file   Social History Narrative   Patient lives at home with family.   Caffeine Use: 1-2 cups daily   No tobacco, recreational drugs. Social drinking only.   Wears her seatbelt, has smoke alarm in the home.   Feels safe in her relationships.     PHYSICAL EXAM  Vitals:   06/30/16 1047  BP: 101/71  Pulse: 86  Resp: 18  Weight: 249 lb (112.9 kg)  Height: 6\' 1"  (1.854 m)    Body mass index is 32.85 kg/m.   General: The patient is well-developed and well-nourished and in no acute distress  Neck:   She is mildly  tender over the splenius capitus muscles and occipital nerves.   Good ROM in neck  Neurologic Exam  Mental status: The patient is alert and oriented x 3 at the time of the examination. The patient has apparent normal recent and remote memory, with an apparently normal attention span and concentration ability.   Speech is normal.  Cranial nerves: Extraocular movements are full.  Facial symmetry is present. There is good facial sensation to soft touch bilaterally.Facial strength is normal.  Trapezius and sternocleidomastoid strength is normal. No dysarthria  is noted. No obvious hearing deficits are noted.  Motor:  Muscle bulk is normal.   Tone is normal. Strength is  5 / 5 in all 4 extremities.   Sensory: Sensory testing is intact to  Touch sensation in all 4 extremities.  Coordination: Cerebellar testing reveals good finger-nose-finger bilaterally.  Gait and station: Station is normal.   Gait is normal today. Tandem gait is mildly wide. Romberg is negative.   Reflexes: Deep tendon reflexes are symmetric and 1+ bilaterally.     DIAGNOSTIC DATA (LABS, IMAGING, TESTING) - I reviewed patient records, labs, notes, testing and imaging myself where available.  Lab Results  Component Value Date   WBC 7.8 12/29/2015   HGB 12.1 05/01/2015   HCT 37.9 12/29/2015   MCV 84 12/29/2015   PLT 250 12/29/2015      Component Value Date/Time   NA 139 01/30/2016 1555   NA 137 04/15/2014 1554   K 4.1 01/30/2016 1555   CL 106 01/30/2016 1555   CO2 20 01/30/2016 1555   GLUCOSE 106 (H) 01/30/2016 1555   BUN 14 01/30/2016 1555   BUN 15 04/15/2014 1554   CREATININE 0.90 01/30/2016 1555   CALCIUM 9.9 01/30/2016 1555   PROT 6.8 01/30/2016 1555   PROT 6.9 12/29/2015 1400   ALBUMIN 4.2 01/30/2016 1555   ALBUMIN 4.5 12/29/2015 1400   AST 13 01/30/2016 1555   ALT 18 01/30/2016 1555   ALKPHOS 94 01/30/2016 1555   BILITOT 0.6 01/30/2016 1555   BILITOT 0.6 12/29/2015 1400   GFRNONAA 102 04/15/2014 1554   GFRAA 117 04/15/2014 1554       ASSESSMENT AND PLAN  MS (multiple sclerosis) (HCC) - Plan: Stratify JCV Antibody Test ([facility]), HCG, Qualitative, CBC with Differential/Platelet, MR BRAIN W WO  CONTRAST  Common migraine without intractability  Depression, unspecified depression type  Other fatigue  High risk medication use - Plan: HCG, Qualitative, CBC with Differential/Platelet    1.  Continue Tysabri. We will check an MRI of the brain without contrast to better evaluate to make sure that there has not been subclinical progression. Consider  a change in therapy if this is occurring.   Also check CBC, hCG and JCV.  She is thinking about becoming pregnant later in the year and we will stop the Tysabri if that occurs. She will check a urine pregnancy tests before each of her infusions and we will stop the infusions if she has become pregnant. 2.  She will continue to see Dr. Jennelle Human for her psychologic issues. 3.  Continue topirmate 200 mg daily and Imitrex when necessary. 4.  Return to clinic as scheduled months for a regular follow-up visit or call sooner if new or worsening neurologic symptoms.  45 minutes face-to-face evaluation with greater than one half of the time counseling or coordinating care about her MS, related symptoms and plan to have another baby soon.  Lyan Holck A. Epimenio Foot, MD, PhD 06/30/2016, 5:38 PM Certified in Neurology, Clinical Neurophysiology, Sleep Medicine, Pain Medicine and Neuroimaging  [facility] [address] [place], [place] [address] [phone] [[k

## 2016-07-01 ENCOUNTER — Encounter: Payer: Self-pay | Admitting: Family Medicine

## 2016-07-01 LAB — HCG, SERUM, QUALITATIVE: hCG,Beta Subunit,Qual,Serum: NEGATIVE m[IU]/mL (ref <6)

## 2016-07-01 LAB — CBC WITH DIFFERENTIAL/PLATELET
Basophils Absolute: 0 10*3/uL (ref 0.0–0.2)
Basos: 0 %
EOS (ABSOLUTE): 0.1 10*3/uL (ref 0.0–0.4)
EOS: 1 %
HEMATOCRIT: 36.2 % (ref 34.0–46.6)
Hemoglobin: 11.9 g/dL (ref 11.1–15.9)
IMMATURE GRANS (ABS): 0 10*3/uL (ref 0.0–0.1)
IMMATURE GRANULOCYTES: 0 %
Lymphocytes Absolute: 3.6 10*3/uL — ABNORMAL HIGH (ref 0.7–3.1)
Lymphs: 46 %
MCH: 26.7 pg (ref 26.6–33.0)
MCHC: 32.9 g/dL (ref 31.5–35.7)
MCV: 81 fL (ref 79–97)
MONOCYTES: 6 %
Monocytes Absolute: 0.4 10*3/uL (ref 0.1–0.9)
NEUTROS PCT: 47 %
Neutrophils Absolute: 3.7 10*3/uL (ref 1.4–7.0)
Platelets: 237 10*3/uL (ref 150–379)
RBC: 4.45 x10E6/uL (ref 3.77–5.28)
RDW: 14.6 % (ref 12.3–15.4)
WBC: 7.9 10*3/uL (ref 3.4–10.8)

## 2016-07-20 ENCOUNTER — Other Ambulatory Visit: Payer: Managed Care, Other (non HMO)

## 2016-07-23 ENCOUNTER — Other Ambulatory Visit: Payer: Self-pay | Admitting: Neurology

## 2016-07-23 ENCOUNTER — Ambulatory Visit
Admission: RE | Admit: 2016-07-23 | Discharge: 2016-07-23 | Disposition: A | Discharge status: Home / Self Care | Payer: Managed Care, Other (non HMO) | Source: Ambulatory Visit | Attending: Neurology | Admitting: Neurology

## 2016-07-23 DIAGNOSIS — G35 Multiple sclerosis: Secondary | ICD-10-CM

## 2016-07-23 MED ORDER — GADOBENATE DIMEGLUMINE 529 MG/ML IV SOLN
20.0000 mL | Freq: Once | INTRAVENOUS | Status: AC | PRN
  Administered 2016-07-23: 20 mL via INTRAVENOUS

## 2016-07-26 ENCOUNTER — Encounter: Payer: Self-pay | Admitting: Neurology

## 2016-07-26 ENCOUNTER — Other Ambulatory Visit: Payer: Self-pay | Admitting: Neurology

## 2016-07-26 ENCOUNTER — Telehealth: Payer: Self-pay | Admitting: *Deleted

## 2016-07-26 NOTE — Telephone Encounter (Signed)
-----   Message from Asa Lente, MD sent at 07/26/2016  2:17 PM EDT ----- Please let her knowthat the MRI of the brain does not show any new lesions.

## 2016-07-26 NOTE — Telephone Encounter (Signed)
LMOM (identified vm) that MRI brain shows no new lesions, per RAS.  She does not need to return this call unless she has questions/fim

## 2016-07-28 ENCOUNTER — Telehealth: Payer: Self-pay | Admitting: *Deleted

## 2016-07-28 MED ORDER — LISDEXAMFETAMINE DIMESYLATE 60 MG PO CAPS: 60.0000 mg | ORAL_CAPSULE | ORAL | 0 refills | Status: DC

## 2016-07-28 NOTE — Telephone Encounter (Signed)
[name] up front [facility], per pt's emailed request/fim

## 2016-08-04 ENCOUNTER — Encounter: Payer: Self-pay | Admitting: Physician Assistant

## 2016-08-04 ENCOUNTER — Ambulatory Visit (INDEPENDENT_AMBULATORY_CARE_PROVIDER_SITE_OTHER): Payer: Managed Care, Other (non HMO) | Admitting: Physician Assistant

## 2016-08-04 VITALS — BP 118/80 | HR 78 | Temp 98.5°F | Resp 14 | Ht 73.0 in | Wt 248.0 lb

## 2016-08-04 DIAGNOSIS — H0259 Other disorders affecting eyelid function: Secondary | ICD-10-CM | POA: Diagnosis not present

## 2016-08-04 NOTE — Progress Notes (Signed)
Pre visit review using our clinic review tool, if applicable. No additional management support is needed unless otherwise documented below in the visit note. 

## 2016-08-04 NOTE — Patient Instructions (Signed)
Apply compresses to the area. Take Benadryl as directed over the next day to help calm down the swelling in the area. Do not see sign of eyelid infection at present.  If you are not noting improvement with this regimen in 24-48 hours or anything worsens, I would want an eye specialist to take a look.  Avoid any dusting for a while!

## 2016-08-04 NOTE — Progress Notes (Signed)
Patient presents to clinic today c/o swelling of R upper eyelid noting yesterday evening after cleaning her house. Noted itching and tenderness at site without drainage. Notes some pressure in the area when moving eye to the right. Denies eye pain, redness or vision changes. Denies fever, chills, malaise or fatigue.  Past Medical History:  Diagnosis Date  . Anxiety   . Depression   . Diabetes type 2, controlled (HCC)   . Elevated hemoglobin A1c   . Hyperlipidemia   . Migraine   . Multiple sclerosis (HCC)    receives Tysabri infusion q 28 days    Current Outpatient Prescriptions on File Prior to Visit  Medication Sig Dispense Refill  . amphetamine-dextroamphetamine (ADDERALL) 10 MG tablet Take one tablet daily in the afternoon 30 tablet 0  . BD SAFETYGLIDE SHIELDED NEEDLE 25G X 1" MISC USE AS DIRECTED 10 each 7  . ketorolac (TORADOL) 30 MG/ML injection One cc IM prn migraine.  No more than 2/day Dispense with 8 (eight)    3cc syringes with 1 inch 25g needles 8 mL 11  . lisdexamfetamine ([name]) 60 MG capsule Take 1 capsule (60 mg total) by mouth every morning. 30 capsule 0  . metFORMIN (GLUCOPHAGE) 500 MG tablet 1000 mg BID with meals. Needs appt prior to anymore refills. 120 tablet 5  . natalizumab (TYSABRI) 300 MG/15ML injection Inject into the vein. Every 28 days    . promethazine (PHENERGAN) 25 MG tablet Take 1 tablet (25 mg total) by mouth every 6 (six) hours as needed for nausea or vomiting. 12 tablet 0  . rizatriptan (MAXALT-MLT) 10 MG disintegrating tablet Take 1 tablet (10 mg total) by mouth as needed for migraine. May repeat in 2 hours if needed 9 tablet 11  . TRINTELLIX 20 MG TABS Take 20 mg by mouth daily. 30 tablet 5  . valACYclovir (VALTREX) 1000 MG tablet 2000 mg every 12 hours for 2 doses. 8 tablet 3  . LORYNA 3-0.02 MG tablet TAKE ONE TABLET EACH DAY  3  . topiramate (TOPAMAX) 100 MG tablet Take 1 tablet (100 mg total) by mouth 2 (two) times daily. (Patient not taking:  Reported on 08/04/2016) 60 tablet 11   No current facility-administered medications on file prior to visit.     No Known Allergies  Family History  Problem Relation Age of Onset  . Diabetes Mother   . Hypertension Mother   . Dementia Mother   . Bipolar disorder Mother   . Heart failure Father   . Hypertension Father   . Heart disease Father   . Ovarian cancer Maternal Grandmother   . Alzheimer's disease Paternal Grandmother   . Skin cancer Maternal Grandfather     Social History   Social History  . Marital status: Married    Spouse name: Rollen Sox  . Number of children: 1  . Years of education: MA   Occupational History  .  [employer]   Social History Main Topics  . Smoking status: Never Smoker  . Smokeless tobacco: Never Used  . Alcohol use Yes     Comment: socially; once a week  . Drug use: No  . Sexual activity: Yes   Other Topics Concern  . None   Social History Narrative   Patient lives at home with family.   Caffeine Use: 1-2 cups daily   No tobacco, recreational drugs. Social drinking only.   Wears her seatbelt, has smoke alarm in the home.   Feels safe in her relationships.  Review of Systems - See HPI.  All other ROS are negative.  BP 118/80   Pulse 78   Temp 98.5 F (36.9 C) (Oral)   Resp 14   Ht 6\' 1"  (1.854 m)   Wt 248 lb (112.5 kg)   SpO2 98%   BMI 32.72 kg/m   Physical Exam  Constitutional: She is oriented to person, place, and time and well-developed, well-nourished, and in no distress.  HENT:  Head: Normocephalic and atraumatic.  Eyes: Conjunctivae and EOM are normal. Pupils are equal, round, and reactive to light. Right eye exhibits no discharge. Right conjunctiva is not injected. Right conjunctiva has no hemorrhage.    Neck: Neck supple.  Cardiovascular: Normal rate, regular rhythm, normal heart sounds and intact distal pulses.   Pulmonary/Chest: Effort normal and breath sounds normal. No respiratory distress. She has no  wheezes. She has no rales. She exhibits no tenderness.  Lymphadenopathy:    She has no cervical adenopathy.  Neurological: She is alert and oriented to person, place, and time.  Skin: Skin is warm and dry.  Psychiatric: Affect normal.  Vitals reviewed.   Recent Results (from the past 2160 hour(s))  POCT glycosylated hemoglobin (Hb A1C)     Status: Abnormal   Collection Time: 06/17/16 11:04 AM  Result Value Ref Range   Hemoglobin A1C 6.4   HCG, Qualitative     Status: None   Collection Time: 06/30/16 11:22 AM  Result Value Ref Range   hCG,Beta Subunit,Qual,Serum Negative Negative <6 mIU/mL  CBC with Differential/Platelet     Status: Abnormal   Collection Time: 06/30/16 11:22 AM  Result Value Ref Range   WBC 7.9 3.4 - 10.8 x10E3/uL   RBC 4.45 3.77 - 5.28 x10E6/uL   Hemoglobin 11.9 11.1 - 15.9 g/dL   Hematocrit 16.1 09.6 - 46.6 %   MCV 81 79 - 97 fL   MCH 26.7 26.6 - 33.0 pg   MCHC 32.9 31.5 - 35.7 g/dL   RDW 04.5 40.9 - 81.1 %   Platelets 237 150 - 379 x10E3/uL   Neutrophils 47 Not Estab. %   Lymphs 46 Not Estab. %   Monocytes 6 Not Estab. %   Eos 1 Not Estab. %   Basos 0 Not Estab. %   Neutrophils Absolute 3.7 1.4 - 7.0 x10E3/uL   Lymphocytes Absolute 3.6 (H) 0.7 - 3.1 x10E3/uL   Monocytes Absolute 0.4 0.1 - 0.9 x10E3/uL   EOS (ABSOLUTE) 0.1 0.0 - 0.4 x10E3/uL   Basophils Absolute 0.0 0.0 - 0.2 x10E3/uL   Immature Granulocytes 0 Not Estab. %   Immature Grans (Abs) 0.0 0.0 - 0.1 x10E3/uL    Assessment/Plan: 1. Superficial swelling of eyelid No sign of infection. Suspect allergic reaction to irritant. Discussed use of antihistamines and compresses to the area. If not improving within 24 hours of if anything worsens, will have her assessed by ophthalmology.     Piedad Climes, PA-C

## 2016-08-25 ENCOUNTER — Encounter: Payer: Self-pay | Admitting: Neurology

## 2016-08-30 ENCOUNTER — Encounter: Payer: Self-pay | Admitting: Family Medicine

## 2016-08-30 ENCOUNTER — Ambulatory Visit (INDEPENDENT_AMBULATORY_CARE_PROVIDER_SITE_OTHER): Payer: Managed Care, Other (non HMO) | Admitting: Family Medicine

## 2016-08-30 VITALS — BP 109/76 | HR 96 | Temp 98.4°F | Resp 20 | Wt 250.5 lb

## 2016-08-30 DIAGNOSIS — M79604 Pain in right leg: Secondary | ICD-10-CM

## 2016-08-30 DIAGNOSIS — M79605 Pain in left leg: Secondary | ICD-10-CM

## 2016-08-30 DIAGNOSIS — G35 Multiple sclerosis: Secondary | ICD-10-CM | POA: Diagnosis not present

## 2016-08-30 MED ORDER — PREDNISONE 20 MG PO TABS
ORAL_TABLET | ORAL | 0 refills | Status: DC
Start: 2016-08-30 — End: 2016-10-21

## 2016-08-30 MED ORDER — METHYLPREDNISOLONE ACETATE 80 MG/ML IJ SUSP
80.0000 mg | Freq: Once | INTRAMUSCULAR | Status: AC
  Administered 2016-08-30: 80 mg via INTRAMUSCULAR

## 2016-08-30 MED ORDER — TRAMADOL HCL 50 MG PO TABS: 50.0000 mg | ORAL_TABLET | Freq: Two times a day (BID) | ORAL | 0 refills | Status: DC | PRN

## 2016-08-30 NOTE — Patient Instructions (Addendum)
Steroid shot today and start steroid taper tomorrow.  Use tramadol for severe pain or before bed (try not to take at same time as trintellix).   Make certain to follow with neurologist as scheduled. If worsening do not hesitate to be seen emergently in ED.

## 2016-08-30 NOTE — Progress Notes (Signed)
Anita Black , 1980/06/21, 36 y.o., female MRN: [id number] Patient Care Team    Relationship Specialty Notifications Start End  Natalia Leatherwood, DO PCP - General Family Medicine  02/26/15   Mitchel Honour, DO Consulting Physician Obstetrics and Gynecology  06/18/16   Asa Lente, MD  Neurology  06/18/16     CC: Hip and leg pain bilateral Subjective:   MS flare: Patient presents for an acute office visit with complaints of bilateral upper extremity and lower extremity pain for greater than 4 weeks. Patient has multiple sclerosis, and is established with neurology. She reports her neurologist is out of town, and they were able to get her in for a few more weeks and she just didn't feel like she was able to wait that long. She states she is in a great deal of pain and she has tried her normal regimen of Toradol and has even tried some Tylenol with Codeine she had from prior and nothing seems to be helping her. She had been going to an acupuncturist, but she doesn't feel this is been helpful and now the pain is worsening. She initially felt that the pain was secondary to being on vacation and walking around [place]. She states it just has not stopped or improved since the beginning of March. She reports she has had similar multiple sclerosis flares in the past. She has known multiple spinal lesions. She is endorsing mild right lower extremity weakness. She denies visual changes, dizziness, syncope, urinary or bowel changes. Patient is on tysabri for her multiple sclerosis.  Depression screen [facility] 2/9 08/04/2016 08/04/2016  Decreased Interest 0 0  Down, Depressed, Hopeless 0 0  PHQ - 2 Score 0 0  Altered sleeping - 0  Tired, decreased energy - 0  Change in appetite - 0  Feeling bad or failure about yourself  - 0  Trouble concentrating - 0  Moving slowly or fidgety/restless - 0  Suicidal thoughts - 0  PHQ-9 Score - 0    No Known Allergies Social History  Substance Use Topics  . Smoking  status: Never Smoker  . Smokeless tobacco: Never Used  . Alcohol use Yes     Comment: socially; once a week   Past Medical History:  Diagnosis Date  . Anxiety   . Depression   . Diabetes type 2, controlled (HCC)   . Elevated hemoglobin A1c   . Hyperlipidemia   . Migraine   . Multiple sclerosis (HCC)    receives Tysabri infusion q 28 days   Past Surgical History:  Procedure Laterality Date  . ABDOMINOPLASTY    . CESAREAN SECTION    . CHOLECYSTECTOMY  2001   Family History  Problem Relation Age of Onset  . Diabetes Mother   . Hypertension Mother   . Dementia Mother   . Bipolar disorder Mother   . Heart failure Father   . Hypertension Father   . Heart disease Father   . Ovarian cancer Maternal Grandmother   . Alzheimer's disease Paternal Grandmother   . Skin cancer Maternal Grandfather    Allergies as of 08/30/2016   No Known Allergies     Medication List       Accurate as of 08/30/16  1:39 PM. Always use your most recent med list.          BD SAFETYGLIDE SHIELDED NEEDLE 25G X 1" Misc Generic drug:  NEEDLE (DISP) 25 G USE AS DIRECTED   ketorolac 30 MG/ML injection Commonly  known as:  TORADOL One cc IM prn migraine.  No more than 2/day Dispense with 8 (eight)    3cc syringes with 1 inch 25g needles   lisdexamfetamine 60 MG capsule Commonly known as:  [name] Take 1 capsule (60 mg total) by mouth every morning.   LORYNA 3-0.02 MG tablet Generic drug:  drospirenone-ethinyl estradiol TAKE ONE TABLET EACH DAY   metFORMIN 500 MG tablet Commonly known as:  GLUCOPHAGE 1000 mg BID with meals. Needs appt prior to anymore refills.   natalizumab 300 MG/15ML injection Commonly known as:  TYSABRI Inject into the vein. Every 28 days   promethazine 25 MG tablet Commonly known as:  PHENERGAN Take 1 tablet (25 mg total) by mouth every 6 (six) hours as needed for nausea or vomiting.   rizatriptan 10 MG disintegrating tablet Commonly known as:  MAXALT-MLT Take 1  tablet (10 mg total) by mouth as needed for migraine. May repeat in 2 hours if needed   TRINTELLIX 20 MG Tabs Generic drug:  vortioxetine HBr Take 20 mg by mouth daily.   valACYclovir 1000 MG tablet Commonly known as:  VALTREX 2000 mg every 12 hours for 2 doses.       No results found for this or any previous visit (from the past 24 hour(s)). No results found.   ROS: Negative, with the exception of above mentioned in HPI   Objective:  BP 109/76 (BP Location: Left Arm, Patient Position: Sitting, Cuff Size: Large)   Pulse 96   Temp 98.4 F (36.9 C)   Resp 20   Wt 250 lb 8 oz (113.6 kg)   LMP 08/13/2016   SpO2 98%   BMI 33.05 kg/m  Body mass index is 33.05 kg/m. Gen: Afebrile. No acute distress. Nontoxic in appearance, well developed, well nourished. Appears uncomfortable. HENT: AT. [place]. MMM, no oral lesions. Eyes:Pupils Equal Round Reactive to light, Extraocular movements intact,  Conjunctiva without redness, discharge or icterus. Neck/lymp/endocrine: Supple,No lymphadenopathy CV: RRR, no edema Chest: CTAB, no wheeze or crackles. Good air movement, normal resp effort.  Abd: Soft. NTND. BS present.  Skin: No rashes, purpura or petechiae.  Neuro/msk:  Normal gait. PERLA. EOMi. Alert. Oriented x3 Cranial nerves II through XII intact. Muscle strength 5/5 bilateral upper/lower extremity, with the exception of mild 4+/5 weakness right lower extremity in extension and hip flexion.  Psych: Tearful, otherwise Normal affect, dress and demeanor. Normal speech. Normal thought content and judgment.  Assessment/Plan: Anita Black is a 36 y.o. female present for OV for  Multiple sclerosis (HCC) Lower extremity pain, bilateral - Patient seems to be uncomfortable today, laying on exam table and is tearful. - traMADol (ULTRAM) 50 MG tablet; Take 1 tablet (50 mg total) by mouth every 12 (twelve) hours as needed.  Dispense: 30 tablet; Refill: 0 - predniSONE (DELTASONE) 20 MG tablet; 60  mg x3d, 40 mg x3d, 20 mg x2d, 10 mg x2d  Dispense: 18 tablet; Refill: 0 - Discussed with patient emergent care. If she is not seeing improvement in the current regimen of a prednisone burst/taper and IM Depo-Medrol injection today, or her symptoms worsen she should be seen in the emergency room. - Tramadol provided for pain. - Follow-up neurology as scheduled.  Reviewed expectations re: course of current medical issues.  Discussed self-management of symptoms.  Outlined signs and symptoms indicating need for more acute intervention.  Patient verbalized understanding and all questions were answered.  Patient received an After-Visit Summary.   electronically signed by:  Luster Landsberg  Raoul Pitch, DO  [facility] - [place]

## 2016-09-03 ENCOUNTER — Telehealth: Payer: Self-pay | Admitting: Family Medicine

## 2016-09-03 NOTE — Telephone Encounter (Signed)
Called patient advised her to contact Neurology since they are the one that prescribed the medications she was inquiring about . Patient verbalized understanding.

## 2016-09-03 NOTE — Telephone Encounter (Signed)
Patient Name: Anita Black  DOB: [birth year]    Initial Comment Caller has MS and is taking a medication that she is not supposed to have a live virus vaccine and states her son got the chicken pox vaccine and she wants to know if she has been exposed to a live virus and she is also taking steroids and wants to know if she needs to stop taking them.    Nurse Assessment  Nurse: Bess Harvest, RN, Marchelle Folks Date/Time (Eastern Time): 09/03/2016 12:33:59 PM  Confirm and document reason for call. If symptomatic, describe symptoms. ---Caller states she takes steroids (Prednisone) for MS, started Monday. Also takes Tysabri IV. Son received Varicella vaccine today, wants to know if she should stop taking steroids.  Does the patient have any new or worsening symptoms? ---No  Please document clinical information provided and list any resource used. ---Caller with question regarding current medication and vaccine son received today. Does not wish to be triaged at this time.     Guidelines    Guideline Title Affirmed Question Affirmed Notes       Final Disposition User        Comments  Unable to accurately answer caller's question after researching resources. Instructed caller to contact pharmacy and to call back if pharmacist is unable to answer question, verbalized understanding. Reviewed incubation period and symptoms of chicken pox/shingles per patient request. Info reviewed per Lynnea Ferrier, M.D. - Telephone Triage Protocols.

## 2016-09-15 ENCOUNTER — Ambulatory Visit (INDEPENDENT_AMBULATORY_CARE_PROVIDER_SITE_OTHER): Payer: Managed Care, Other (non HMO) | Admitting: Neurology

## 2016-09-15 ENCOUNTER — Encounter: Payer: Self-pay | Admitting: Neurology

## 2016-09-15 VITALS — BP 128/90 | HR 102 | Resp 6 | Ht 73.0 in | Wt 245.0 lb

## 2016-09-15 DIAGNOSIS — F329 Major depressive disorder, single episode, unspecified: Secondary | ICD-10-CM

## 2016-09-15 DIAGNOSIS — R208 Other disturbances of skin sensation: Secondary | ICD-10-CM | POA: Diagnosis not present

## 2016-09-15 DIAGNOSIS — R5383 Other fatigue: Secondary | ICD-10-CM | POA: Diagnosis not present

## 2016-09-15 DIAGNOSIS — G43009 Migraine without aura, not intractable, without status migrainosus: Secondary | ICD-10-CM

## 2016-09-15 DIAGNOSIS — G35 Multiple sclerosis: Secondary | ICD-10-CM

## 2016-09-15 DIAGNOSIS — F32A Depression, unspecified: Secondary | ICD-10-CM

## 2016-09-15 MED ORDER — LISDEXAMFETAMINE DIMESYLATE 60 MG PO CAPS: 60.0000 mg | ORAL_CAPSULE | ORAL | 0 refills | Status: AC

## 2016-09-15 MED ORDER — INDOMETHACIN 25 MG PO CAPS: 25.0000 mg | ORAL_CAPSULE | Freq: Three times a day (TID) | ORAL | 0 refills | Status: DC | PRN

## 2016-09-15 MED ORDER — GABAPENTIN 300 MG PO CAPS: ORAL_CAPSULE | ORAL | 5 refills | Status: DC

## 2016-09-15 NOTE — Progress Notes (Signed)
[facility]  PATIENT: Anita Black DOB: November 30, 1980     HISTORICAL  CHIEF COMPLAINT:  Chief Complaint  Patient presents with  . Multiple Sclerosis    Sts. she continues to tolerate Tysabri well.  JCV ab last checked 12/29/16 and was negative at 0.08  Sts. she believes cognition is worse, pain bilat legs has been worse.  Sts. pcp gave her a Depo Medrol inj. and oral steroids and this has helped some/fim    HISTORY OF PRESENT ILLNESS:   Anita Black is a 36 year old woman with MS.   Since her last visit she has been noting much more leg pain..  MS:  She feels her MS is mostly stable.   She is on Tysabri 300 mg IV monthly and tolerates it well. She denies any MS exacerbation. Her JCV antibody was checked 2 months ago and was negative at 0.10.   She is planning to have a child soon.  I recommended that she check a pregnancy test before each Tysabri infusion and call us if positive.    Leg Pain:  She notes pain in both hips and pains.   Pain is deep like bones being crushed.   Her PCP gave her a shot of Depo-Medrol with some benefit.    Her feet feel more numb.    Headaches/neck pain:   These are currently stable.   She had several bouts of  bifrontal and bi-occipital headache.    The splenius capitus TPI helped her a lot at the last visit.     Toradol shots help the migraines when they occur.   Imitrex had not helped.  She stopped Topamax to conceive.  Gait/strength: She denies any change in leg strength. She feels her gait is about the same now as it was before her pain intensified. .                   Bladder/bowel: She denies much bladder dysfunction. She has only 1 x nocturia,     She has had bowel incontinence that has been worse with her increased pain (several episodes in the last month).   She gets no notice before the incontinence.    Vision: She has had optic neuritis involving each eye. When tired, vision seems more blurry.  VA is 20/30 out of either  eye.   She has not had diplopia.  Fatigue/sleep: Fatigue is worse again.     She notes mild benefit with [name] 60 mg.  Amantadine had not helped in the past.  Sleep onset is easy and she has less insomnia now.    She snores but has never been noted to have pauses in her breathing or gasping.   She wakes up with nocturia once most nights but falls back asleep in 5-10 minutes,     Mood/social:   Mood was better last visit and is down a bit curently, though still better than earlier in 2017.   She stopped lithium when she felt she might be pregnant but went back on when she knew she wasn't     She is no longer having frequent spells of tearfulness.   She sees Dr. Jennelle Human once a month.              Cognition: Cognition is mostly ok but attention and focus is a problem and [name] just helps a little bit.   .   She notes reduced STM and focus is reduced.  . She also  notes difficulty with verbal fluency.   MS History:  She was diagnosed with multiple sclerosis in 2009 after presenting with numbness in the arms and torso, primarily in the front of her body. An MRI of the brain was normal but an MRI of the spinal cord showed 2 cervical plaques. She was started on Rebif but had difficulty tolerating the medication and after about a year switched to Copaxone. Unfortunately, she also had trouble tolerating Copaxone. She then was on Copaxone and had injeciotn reactions and switched to Avonex.  After her pregnanacy, she was switched to Gilenya. However, she had bradycardia and that was discontinued. In January 2015 she started Tysabri and continues on that medication. [place]. She continues her Tysabri treatments here.     She is JCV antibody negative. She feels better with the Tysabri infusions in general. However, she gets nausea that lasts several days after each infusion.   MRI Brain from 07/01/14 shows about 10-12 hyperintense foci consistent with MS. No lesions were seen in the  posterior fossa.    ROS:  Out of a complete 14 system review of symptoms, the patient complains only of the following symptoms, and all other reviewed systems are negative.   She notes fatigue and depression.   She has headaches, anddifficulty with attention, Snoring, rare bowel incontinence   ALLERGIES: No Known Allergies  HOME MEDICATIONS:  Current Outpatient Prescriptions:  .  BD SAFETYGLIDE SHIELDED NEEDLE 25G X 1" MISC, USE AS DIRECTED, Disp: 10 each, Rfl: 7 .  ketorolac (TORADOL) 30 MG/ML injection, One cc IM prn migraine.  No more than 2/day Dispense with 8 (eight)    3cc syringes with 1 inch 25g needles, Disp: 8 mL, Rfl: 11 .  lisdexamfetamine ([name]) 60 MG capsule, Take 1 capsule (60 mg total) by mouth every morning., Disp: 30 capsule, Rfl: 0 .  lithium carbonate 300 MG capsule, , Disp: , Rfl:  .  LORYNA 3-0.02 MG tablet, TAKE ONE TABLET EACH DAY, Disp: , Rfl: 3 .  metFORMIN (GLUCOPHAGE) 500 MG tablet, 1000 mg BID with meals. Needs appt prior to anymore refills., Disp: 120 tablet, Rfl: 5 .  natalizumab (TYSABRI) 300 MG/15ML injection, Inject into the vein. Every 28 days, Disp: , Rfl:  .  predniSONE (DELTASONE) 20 MG tablet, 60 mg x3d, 40 mg x3d, 20 mg x2d, 10 mg x2d, Disp: 18 tablet, Rfl: 0 .  promethazine (PHENERGAN) 25 MG tablet, Take 1 tablet (25 mg total) by mouth every 6 (six) hours as needed for nausea or vomiting., Disp: 12 tablet, Rfl: 0 .  rizatriptan (MAXALT-MLT) 10 MG disintegrating tablet, Take 1 tablet (10 mg total) by mouth as needed for migraine. May repeat in 2 hours if needed, Disp: 9 tablet, Rfl: 11 .  traMADol (ULTRAM) 50 MG tablet, Take 1 tablet (50 mg total) by mouth every 12 (twelve) hours as needed., Disp: 30 tablet, Rfl: 0 .  TRINTELLIX 20 MG TABS, Take 20 mg by mouth daily., Disp: 30 tablet, Rfl: 5 .  valACYclovir (VALTREX) 1000 MG tablet, 2000 mg every 12 hours for 2 doses., Disp: 8 tablet, Rfl: 3 .  gabapentin (NEURONTIN) 300 MG capsule, One po qAM,  one po qPM and two po qHS, Disp: 120 capsule, Rfl: 5 .  indomethacin (INDOCIN) 25 MG capsule, Take 1 capsule (25 mg total) by mouth 3 (three) times daily as needed., Disp: 30 capsule, Rfl: 0  PAST MEDICAL HISTORY: Past Medical History:  Diagnosis Date  .  Anxiety   . Depression   . Diabetes type 2, controlled (HCC)   . Elevated hemoglobin A1c   . Hyperlipidemia   . Migraine   . Multiple sclerosis (HCC)    receives Tysabri infusion q 28 days    PAST SURGICAL HISTORY: Past Surgical History:  Procedure Laterality Date  . ABDOMINOPLASTY    . CESAREAN SECTION    . CHOLECYSTECTOMY  2001    FAMILY HISTORY: Family History  Problem Relation Age of Onset  . Diabetes Mother   . Hypertension Mother   . Dementia Mother   . Bipolar disorder Mother   . Heart failure Father   . Hypertension Father   . Heart disease Father   . Ovarian cancer Maternal Grandmother   . Alzheimer's disease Paternal Grandmother   . Skin cancer Maternal Grandfather     SOCIAL HISTORY:  Social History   Social History  . Marital status: Married    Spouse name: Rollen Sox  . Number of children: 1  . Years of education: MA   Occupational History  .  [employer]   Social History Main Topics  . Smoking status: Never Smoker  . Smokeless tobacco: Never Used  . Alcohol use Yes     Comment: socially; once a week  . Drug use: No  . Sexual activity: Yes   Other Topics Concern  . Not on file   Social History Narrative   Patient lives at home with family.   Caffeine Use: 1-2 cups daily   No tobacco, recreational drugs. Social drinking only.   Wears her seatbelt, has smoke alarm in the home.   Feels safe in her relationships.     PHYSICAL EXAM  Vitals:   09/15/16 1354  BP: 128/90  Pulse: (!) 102  Resp: (!) 6  Weight: 245 lb (111.1 kg)  Height: 6\' 1"  (1.854 m)    Body mass index is 32.32 kg/m.   General: The patient is well-developed and well-nourished and in no acute  distress  Neck:   She is mildly  tender over the splenius capitus muscles and occipital nerves.   Good ROM in neck  Neurologic Exam  Mental status: The patient is alert and oriented x 3 at the time of the examination. The patient has apparent normal recent and remote memory, with an apparently normal attention span and concentration ability.   Speech is normal.  Cranial nerves: Extraocular movements are full.  Facial symmetry is present. There is good facial sensation to soft touch bilaterally.Facial strength is normal.  Trapezius and sternocleidomastoid strength is normal. No dysarthria is noted. No obvious hearing deficits are noted.  Motor:  Muscle bulk is normal.   Tone is normal. Strength is  5 / 5 in all 4 extremities.   Sensory: Sensory testing is intact to  Touch sensation in all 4 extremities.  Coordination: Cerebellar testing reveals good finger-nose-finger bilaterally.  Gait and station: Station is normal.   Gait is normal today. Tandem gait is mildly wide. Romberg is negative.   Reflexes: Deep tendon reflexes are symmetric and 1+ bilaterally.     DIAGNOSTIC DATA (LABS, IMAGING, TESTING) - I reviewed patient records, labs, notes, testing and imaging myself where available.  Lab Results  Component Value Date   WBC 7.9 06/30/2016   HGB 12.1 05/01/2015   HCT 36.2 06/30/2016   MCV 81 06/30/2016   PLT 237 06/30/2016      Component Value Date/Time   NA 139 01/30/2016 1555   NA  137 04/15/2014 1554   K 4.1 01/30/2016 1555   CL 106 01/30/2016 1555   CO2 20 01/30/2016 1555   GLUCOSE 106 (H) 01/30/2016 1555   BUN 14 01/30/2016 1555   BUN 15 04/15/2014 1554   CREATININE 0.90 01/30/2016 1555   CALCIUM 9.9 01/30/2016 1555   PROT 6.8 01/30/2016 1555   PROT 6.9 12/29/2015 1400   ALBUMIN 4.2 01/30/2016 1555   ALBUMIN 4.5 12/29/2015 1400   AST 13 01/30/2016 1555   ALT 18 01/30/2016 1555   ALKPHOS 94 01/30/2016 1555   BILITOT 0.6 01/30/2016 1555   BILITOT 0.6 12/29/2015  1400   GFRNONAA 102 04/15/2014 1554   GFRAA 117 04/15/2014 1554       ASSESSMENT AND PLAN  Multiple sclerosis (HCC)  Dysesthesia  Depression, unspecified depression type  Other fatigue  Common migraine without intractability    1.  She will continue Tysabri. Last MRI of the brain showed that there were no new lesions. She has been JCV antibody negative. Because she is thinking about getting pregnant. She should continue to check a urine pregnancy test before each of her infusions and to let us know if she becomes pregnant.   2.  She will continue to see Dr. Jennelle Human for her psychologic issues. 3.  For her deep dysesthetic pain I will start gabapentin. Additionally, take indomethacin on an when pain accept. Due to her diabetes. I would not want her to take indomethacin on a regular basis. 4.  Return to clinic as scheduled months for a regular follow-up visit or call sooner if new or worsening neurologic symptoms.   Gurshaan Matsuoka A. Epimenio Foot, MD, PhD 09/15/2016, 2:51 PM Certified in Neurology, Clinical Neurophysiology, Sleep Medicine, Pain Medicine and Neuroimaging  [facility] [address] [place], [place] [address] [phone] [[k

## 2016-09-17 ENCOUNTER — Emergency Department (HOSPITAL_COMMUNITY)
Admission: EM | Admit: 2016-09-17 | Discharge: 2016-09-18 | Disposition: A | Discharge status: Home / Self Care | Payer: Managed Care, Other (non HMO) | Attending: Emergency Medicine | Admitting: Emergency Medicine

## 2016-09-17 ENCOUNTER — Encounter (HOSPITAL_COMMUNITY): Payer: Self-pay

## 2016-09-17 DIAGNOSIS — Z79899 Other long term (current) drug therapy: Secondary | ICD-10-CM | POA: Diagnosis not present

## 2016-09-17 DIAGNOSIS — Z818 Family history of other mental and behavioral disorders: Secondary | ICD-10-CM | POA: Diagnosis not present

## 2016-09-17 DIAGNOSIS — Z7984 Long term (current) use of oral hypoglycemic drugs: Secondary | ICD-10-CM | POA: Insufficient documentation

## 2016-09-17 DIAGNOSIS — E119 Type 2 diabetes mellitus without complications: Secondary | ICD-10-CM | POA: Diagnosis not present

## 2016-09-17 DIAGNOSIS — F32A Depression, unspecified: Secondary | ICD-10-CM

## 2016-09-17 DIAGNOSIS — F329 Major depressive disorder, single episode, unspecified: Secondary | ICD-10-CM | POA: Insufficient documentation

## 2016-09-17 DIAGNOSIS — R45851 Suicidal ideations: Secondary | ICD-10-CM

## 2016-09-17 DIAGNOSIS — Z810 Family history of intellectual disabilities: Secondary | ICD-10-CM | POA: Diagnosis not present

## 2016-09-17 LAB — ETHANOL: Alcohol, Ethyl (B): <5 mg/dL (ref <5)

## 2016-09-17 LAB — COMPREHENSIVE METABOLIC PANEL
ALBUMIN: 4.2 g/dL (ref 3.5–5.0)
ALT: 16 U/L (ref 14–54)
AST: 17 U/L (ref 15–41)
Alkaline Phosphatase: 101 U/L (ref 38–126)
Anion gap: 12 (ref 5–15)
BILIRUBIN TOTAL: 0.9 mg/dL (ref 0.3–1.2)
BUN: 13 mg/dL (ref 6–20)
CHLORIDE: 106 mmol/L (ref 101–111)
CO2: 21 mmol/L — ABNORMAL LOW (ref 22–32)
Calcium: 9.3 mg/dL (ref 8.9–10.3)
Creatinine, Ser: 0.69 mg/dL (ref 0.44–1.00)
GFR calc Af Amer: >60 mL/min (ref >60)
GFR calc non Af Amer: >60 mL/min (ref >60)
GLUCOSE: 102 mg/dL — AB (ref 65–99)
POTASSIUM: 3.8 mmol/L (ref 3.5–5.1)
Sodium: 139 mmol/L (ref 135–145)
TOTAL PROTEIN: 7.6 g/dL (ref 6.5–8.1)

## 2016-09-17 LAB — CBC WITH DIFFERENTIAL/PLATELET
Basophils Absolute: 0 10*3/uL (ref 0.0–0.1)
Basophils Relative: 0 %
EOS ABS: 0.1 10*3/uL (ref 0.0–0.7)
Eosinophils Relative: 1 %
HCT: 39 % (ref 36.0–46.0)
Hemoglobin: 12.8 g/dL (ref 12.0–15.0)
LYMPHS ABS: 3.4 10*3/uL (ref 0.7–4.0)
Lymphocytes Relative: 41 %
MCH: 26.3 pg (ref 26.0–34.0)
MCHC: 32.8 g/dL (ref 30.0–36.0)
MCV: 80.2 fL (ref 78.0–100.0)
MONO ABS: 0.3 10*3/uL (ref 0.1–1.0)
Monocytes Relative: 3 %
NEUTROS ABS: 4.6 10*3/uL (ref 1.7–7.7)
Neutrophils Relative %: 55 %
PLATELETS: 251 10*3/uL (ref 150–400)
RBC: 4.86 MIL/uL (ref 3.87–5.11)
RDW: 14.2 % (ref 11.5–15.5)
WBC: 8.4 10*3/uL (ref 4.0–10.5)

## 2016-09-17 LAB — URINALYSIS, ROUTINE W REFLEX MICROSCOPIC
BILIRUBIN URINE: NEGATIVE
GLUCOSE, UA: NEGATIVE mg/dL
Hgb urine dipstick: NEGATIVE
KETONES UR: NEGATIVE mg/dL
Leukocytes, UA: NEGATIVE
Nitrite: NEGATIVE
PH: 5 (ref 5.0–8.0)
Protein, ur: NEGATIVE mg/dL
Specific Gravity, Urine: 1.024 (ref 1.005–1.030)

## 2016-09-17 LAB — POC URINE PREG, ED: PREG TEST UR: NEGATIVE

## 2016-09-17 LAB — ACETAMINOPHEN LEVEL: Acetaminophen (Tylenol), Serum: <10 ug/mL — ABNORMAL LOW (ref 10–30)

## 2016-09-17 LAB — RAPID URINE DRUG SCREEN, HOSP PERFORMED
AMPHETAMINES: POSITIVE — AB
Barbiturates: NOT DETECTED
Benzodiazepines: POSITIVE — AB
COCAINE: NOT DETECTED
OPIATES: NOT DETECTED
TETRAHYDROCANNABINOL: NOT DETECTED

## 2016-09-17 LAB — SALICYLATE LEVEL: Salicylate Lvl: <7 mg/dL (ref 2.8–30.0)

## 2016-09-17 MED ORDER — RIZATRIPTAN BENZOATE 10 MG PO TBDP: 10.0000 mg | ORAL_TABLET | ORAL | Status: DC | PRN

## 2016-09-17 MED ORDER — GABAPENTIN 300 MG PO CAPS
300.0000 mg | ORAL_CAPSULE | Freq: Two times a day (BID) | ORAL | Status: DC
  Administered 2016-09-17 – 2016-09-18 (×2): 300 mg via ORAL
  Filled 2016-09-17 (×3): qty 1

## 2016-09-17 MED ORDER — ALPRAZOLAM 0.5 MG PO TABS
0.5000 mg | ORAL_TABLET | Freq: Four times a day (QID) | ORAL | Status: DC | PRN
  Administered 2016-09-18: 0.5 mg via ORAL
  Filled 2016-09-17 (×2): qty 1

## 2016-09-17 MED ORDER — SUMATRIPTAN SUCCINATE 50 MG PO TABS
100.0000 mg | ORAL_TABLET | ORAL | Status: DC | PRN
  Filled 2016-09-17: qty 2

## 2016-09-17 MED ORDER — KETOROLAC TROMETHAMINE 30 MG/ML IJ SOLN
30.0000 mg | Freq: Once | INTRAMUSCULAR | Status: AC
  Administered 2016-09-17: 30 mg via INTRAMUSCULAR
  Filled 2016-09-17 (×2): qty 1

## 2016-09-17 MED ORDER — VORTIOXETINE HBR 20 MG PO TABS
20.0000 mg | ORAL_TABLET | Freq: Every day | ORAL | Status: DC
  Administered 2016-09-17 – 2016-09-18 (×2): 20 mg via ORAL
  Filled 2016-09-17 (×2): qty 20

## 2016-09-17 MED ORDER — LITHIUM CARBONATE 300 MG PO CAPS
900.0000 mg | ORAL_CAPSULE | Freq: Every day | ORAL | Status: DC
  Administered 2016-09-17 – 2016-09-18 (×2): 900 mg via ORAL
  Filled 2016-09-17 (×2): qty 3

## 2016-09-17 MED ORDER — METFORMIN HCL 500 MG PO TABS
1000.0000 mg | ORAL_TABLET | Freq: Two times a day (BID) | ORAL | Status: DC
  Administered 2016-09-17 – 2016-09-18 (×2): 1000 mg via ORAL
  Filled 2016-09-17 (×2): qty 2

## 2016-09-17 MED ORDER — LISDEXAMFETAMINE DIMESYLATE 30 MG PO CAPS
60.0000 mg | ORAL_CAPSULE | ORAL | Status: DC
  Administered 2016-09-18: 60 mg via ORAL
  Filled 2016-09-17 (×3): qty 2

## 2016-09-17 MED ORDER — DROSPIRENONE-ETHINYL ESTRADIOL 3-0.02 MG PO TABS: 1.0000 | ORAL_TABLET | Freq: Every day | ORAL | Status: DC

## 2016-09-17 NOTE — ED Notes (Signed)
SBAR Report received from previous nurse. Pt received calm and visible on unit. Pt denies current SI/ HI, A/V H, depression, anxiety, or pain at this time, and appears otherwise stable and free of distress. Pt reminded of camera surveillance, q 15 min rounds, and rules of the milieu. Will continue to assess. 

## 2016-09-17 NOTE — ED Notes (Signed)
Pt requests that only Gwinda Maine and Dr. Jennelle Human visit her. No other visitors. Password Aiden.

## 2016-09-17 NOTE — ED Notes (Signed)
Writer went in to assess pt for her complaints of pain, when writer went into the room pt was eating a candy bar (contraband) in her bed. When she was reminded that there was no outside food, pt stated she was checking the reaction time of cameras. Then pt asked again for her Toradol 30 mg. Which is not currently ordered.

## 2016-09-17 NOTE — ED Notes (Signed)
Pt husband at bedside. EDP at bedside.

## 2016-09-17 NOTE — ED Provider Notes (Addendum)
[facility]-EMERGENCY DEPT Provider Note   CSN: [id number] Arrival date & time: 09/17/16  1248     History   Chief Complaint Chief Complaint  Patient presents with  . Suicidal    HPI Anita Black is a 36 y.o. female.  HPI Patient presents with depression and suicidal thoughts. Sent in by Dr. Jennelle Human, her psychiatrist. Has a history of depression and has been worse recently. Her typical pattern is itchiness followed by acute suicidal thoughts. Had a previous episode like this the end of last year. Started on lithium which improved the symptoms. She then however thought she was pregnant in January and stopped the lithium. Since then she thought she was doing well but now is doing worse. Has planned overdose. Denies substance abuse. No numbness or weakness but did have an itchy rash.   Past Medical History:  Diagnosis Date  . Anxiety   . Depression   . Diabetes type 2, controlled (HCC)   . Elevated hemoglobin A1c   . Hyperlipidemia   . Migraine   . Multiple sclerosis (HCC)    receives Tysabri infusion q 28 days    Patient Active Problem List   Diagnosis Date Noted  . Dysesthesia 09/15/2016  . Cold sore 06/18/2016  . Ectatic aorta (HCC) 02/09/2016  . Allergic urticaria 10/28/2015  . Diabetes type 2, controlled (HCC) 07/09/2015  . Hyperhidrosis of face 07/09/2015  . Encounter for preventive health examination 05/01/2015  . BMI 34.0-34.9,adult 05/01/2015  . Common migraine without intractability 11/20/2014  . Other fatigue 04/15/2014  . Depression 04/15/2014  . MS (multiple sclerosis) (HCC) 01/08/2014  . Multiple sclerosis (HCC) 12/27/2013    Past Surgical History:  Procedure Laterality Date  . ABDOMINOPLASTY    . CESAREAN SECTION    . CHOLECYSTECTOMY  2001    OB History    Gravida Para Term Preterm AB Living   1 1 1     1    SAB TAB Ectopic Multiple Live Births                   Home Medications    Prior to Admission medications   Medication Sig Start Date  End Date Taking? Authorizing Provider  acetaminophen (TYLENOL) 500 MG tablet Take 1,000 mg by mouth every 6 (six) hours as needed.   Yes [provider]  alprazolam Prudy Feeler) 2 MG tablet Take 2 mg by mouth as needed for anxiety.   Yes [provider]  ibuprofen (ADVIL,MOTRIN) 200 MG tablet Take 800 mg by mouth every 6 (six) hours as needed.   Yes [provider]  ketorolac (TORADOL) 30 MG/ML injection One cc IM prn migraine.  No more than 2/day Dispense with 8 (eight)    3cc syringes with 1 inch 25g needles 06/30/16  Yes Sater, Pearletha Furl, MD  lisdexamfetamine ([name]) 60 MG capsule Take 1 capsule (60 mg total) by mouth every morning. 09/15/16  Yes Sater, Pearletha Furl, MD  lithium carbonate 300 MG capsule Take 900 mg by mouth at bedtime.  09/14/16  Yes [provider]  LORYNA 3-0.02 MG tablet TAKE ONE TABLET EACH DAY 01/16/16  Yes [provider]  metFORMIN (GLUCOPHAGE) 500 MG tablet 1000 mg BID with meals. Needs appt prior to anymore refills. 06/17/16  Yes Kuneff, Renee A, DO  natalizumab (TYSABRI) 300 MG/15ML injection Inject into the vein. Every 28 days   Yes [provider]  rizatriptan (MAXALT-MLT) 10 MG disintegrating tablet Take 1 tablet (10 mg total) by mouth as  needed for migraine. May repeat in 2 hours if needed 06/30/16  Yes Sater, Pearletha Furl, MD  traMADol (ULTRAM) 50 MG tablet Take 1 tablet (50 mg total) by mouth every 12 (twelve) hours as needed. 08/30/16  Yes Kuneff, Renee A, DO  TRINTELLIX 20 MG TABS Take 20 mg by mouth daily. 06/25/16  Yes Kuneff, Renee A, DO  valACYclovir (VALTREX) 1000 MG tablet 2000 mg every 12 hours for 2 doses. 06/17/16  Yes Kuneff, Renee A, DO  BD SAFETYGLIDE SHIELDED NEEDLE 25G X 1" MISC USE AS DIRECTED 07/26/16   Sater, Pearletha Furl, MD  gabapentin (NEURONTIN) 300 MG capsule One po qAM, one po qPM and two po qHS 09/15/16   Sater, Pearletha Furl, MD  indomethacin (INDOCIN) 25 MG capsule Take 1 capsule (25 mg total) by mouth 3 (three)  times daily as needed. 09/15/16   Sater, Pearletha Furl, MD  predniSONE (DELTASONE) 20 MG tablet 60 mg x3d, 40 mg x3d, 20 mg x2d, 10 mg x2d 08/30/16   Felix Pacini A, DO    Family History Family History  Problem Relation Age of Onset  . Diabetes Mother   . Hypertension Mother   . Dementia Mother   . Bipolar disorder Mother   . Heart failure Father   . Hypertension Father   . Heart disease Father   . Ovarian cancer Maternal Grandmother   . Alzheimer's disease Paternal Grandmother   . Skin cancer Maternal Grandfather     Social History Social History  Substance Use Topics  . Smoking status: Never Smoker  . Smokeless tobacco: Never Used  . Alcohol use Yes     Comment: socially; once a week     Allergies   Patient has no known allergies.   Review of Systems Review of Systems  Constitutional: Negative for appetite change.  HENT: Negative for congestion.   Respiratory: Negative for shortness of breath.   Gastrointestinal: Negative for abdominal pain.  Genitourinary: Negative for dysuria and flank pain.  Musculoskeletal: Negative for back pain.  Skin: Positive for rash.  Neurological: Negative for numbness.  Hematological: Negative for adenopathy.  Psychiatric/Behavioral: Positive for dysphoric mood and suicidal ideas. Negative for confusion.     Physical Exam Updated Vital Signs BP (!) 128/93 (BP Location: Left Arm)   Pulse 100   Temp 98.4 F (36.9 C) (Oral)   Resp 12   Ht 6' (1.829 m)   Wt 240 lb (108.9 kg)   SpO2 97%   BMI 32.55 kg/m   Physical Exam  Constitutional: She is oriented to person, place, and time. She appears well-developed.  HENT:  Head: Atraumatic.  Eyes: EOM are normal.  Neck: Neck supple.  Cardiovascular: Normal rate.   Pulmonary/Chest: Effort normal.  Abdominal: Soft. There is no tenderness.  Neurological: She is alert and oriented to person, place, and time.  Skin: Skin is warm.  Psychiatric:  Patient is somewhat tearful.     ED  Treatments / Results  Labs (all labs ordered are listed, but only abnormal results are displayed) Labs Reviewed  COMPREHENSIVE METABOLIC PANEL - Abnormal; Notable for the following:       Result Value   CO2 21 (*)    Glucose, Bld 102 (*)    All other components within normal limits  ACETAMINOPHEN LEVEL - Abnormal; Notable for the following:    Acetaminophen (Tylenol), Serum <10 (*)    All other components within normal limits  RAPID URINE DRUG SCREEN, HOSP PERFORMED - Abnormal; Notable for the  following:    Benzodiazepines POSITIVE (*)    Amphetamines POSITIVE (*)    All other components within normal limits  URINALYSIS, ROUTINE W REFLEX MICROSCOPIC - Abnormal; Notable for the following:    APPearance HAZY (*)    All other components within normal limits  ETHANOL  SALICYLATE LEVEL  CBC WITH DIFFERENTIAL/PLATELET  POC URINE PREG, ED    EKG  EKG Interpretation None       Radiology No results found.  Procedures Procedures (including critical care time)  Medications Ordered in ED Medications  gabapentin (NEURONTIN) capsule 300 mg (not administered)  lisdexamfetamine ([name]) capsule 60 mg (not administered)  drospirenone-ethinyl estradiol (YAZ,GIANVI,LORYNA) 3-0.02 MG per tablet 1 tablet (not administered)  metFORMIN (GLUCOPHAGE) tablet 1,000 mg (not administered)  rizatriptan (MAXALT-MLT) disintegrating tablet 10 mg (not administered)  vortioxetine HBr (TRINTELLIX) 20 MG tablet 20 mg (not administered)     Initial Impression / Assessment and Plan / ED Course  I have reviewed the triage vital signs and the nursing notes.  Pertinent labs & imaging results that were available during my care of the patient were reviewed by me and considered in my medical decision making (see chart for details).   Dr Meredith Staggers, [phone]  Patient with depression and suicidal thoughts. Appears medically cleared. Patient's psychiatrist, Dr. Jennelle Human states he can be called with  further questions. Number is above. Appears medically cleared. To be seen by TTS will likely require placement.  Final Clinical Impressions(s) / ED Diagnoses   Final diagnoses:  Suicidal ideation  Depression, unspecified depression type    New Prescriptions New Prescriptions   No medications on file     Benjiman Core, MD 09/17/16 1447  Patient is suicidal and per her psychiatrist a risk to herself. Will need IVC if she will not stay.    Benjiman Core, MD 09/17/16 [phone]

## 2016-09-17 NOTE — ED Notes (Signed)
Bed: [facility] Expected date:  Expected time:  Means of arrival:  Comments: 

## 2016-09-17 NOTE — ED Notes (Signed)
TTS at bedside. 

## 2016-09-17 NOTE — ED Notes (Signed)
ED Charge nurse Patty,RN at bedside per pt and pt husband request.

## 2016-09-17 NOTE — BH Assessment (Signed)
Writer spoke to her psychiatrist Dr. Jennelle Human [phone])  in order to obtain collateral information.  Per Dr. Jennelle Human the patient told him that she was not able to keep herself safe if she went home and restarted her lithium medication.    Per Dr. Jennelle Human the patient told him that she was experiencing suicidal thoughts of taking an overdose of medication.  Patient reports that she has access to medication at home in order to kill herself.  Patient reports that she did not want her husband to find her; therefore, she was going to take the pills while he was not at home and then call EMS so that they would find the patient.  Patient reported to that she wanted to make sure that her husband did not find her.    Patient reports that she stopped taking her psychiatric medication (Lithium) in an attempt to get pregnant. Patient reports that she stopped taking her medication in January.    Writer informed the patient and her husband that the the DNP Nanine Means) has recommended inpatient hospitalization.    TTS will seek placement.

## 2016-09-17 NOTE — ED Notes (Signed)
All personal property sent home with pt husband per pt request.

## 2016-09-17 NOTE — ED Triage Notes (Signed)
Pt c/o suicidal ideation with a plan to overdose and increased depression symptoms. Denies AVH. Denies attempts at suicide. Hx MS, DM2, anxiety, and depression.

## 2016-09-17 NOTE — ED Notes (Signed)
Pt admitted to room #43. Pt tearful on approach, demanding. Pt endorsing increase in depression. Pt reports to this nurse that she has been off her Lithium medication d/t trying to conceive.  Pt reports to this nurse that she was sent to the hospital from her psychiatrist office today.  Encouragement and support provided. Special checks q 15 mins in place for safety, Video monitoring in place. Pt requesting to speak to MD, this nurse notified EDP Dr. Julieanne Manson. Will continue to monitor.

## 2016-09-17 NOTE — ED Notes (Signed)
Bed: [facility] Expected date:  Expected time:  Means of arrival:  Comments: Hold triage 4
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
## 2016-09-17 NOTE — ED Provider Notes (Signed)
5:09 PM Care assumed from Dr. Rubin Payor.   At time of her care, patient is awaiting disposition by behavioral health team. According to Dr. Rubin Payor spoke with patient's primary psychiatrist, Dr. Jennelle Human, patient was sent to the emergency department today for suicidal ideation and a plan of overdose. The plan as outlined by the psychiatry team. Dr. Rubin Payor and himself was to place the patient. Altered commitment if she tried to leave the emergency department. Temperature care, patient is willing to voluntarily seek help.  Nursing reported patient was asking to leave. I went to speak with the patient. Patient was outlined the plan by her psychiatrist and patient agrees with plan of staying in the emergency department for psychiatric evaluation. Patient agreed to stay without involuntary commitment at this time.  Fax was received from the psychiatrist office outlining the medications he wanted to started. Patient will be started on the 900 mg of lithium daily as well as Xanax for depression and sleep. These were ordered.  Anticipate following on their recommendations.   Patient had some headache and chart shows that patient takes Toradol for this. Initially, there was concern for interaction with lithium however, pharmacy was called and reassured that this was safe. They placed the order for her pain medication. Anticipate reassessed by psychiatry team tomorrow.     Goran Olden, Canary Brim, MD 09/18/16 1310

## 2016-09-18 DIAGNOSIS — Z818 Family history of other mental and behavioral disorders: Secondary | ICD-10-CM

## 2016-09-18 DIAGNOSIS — F329 Major depressive disorder, single episode, unspecified: Secondary | ICD-10-CM | POA: Diagnosis not present

## 2016-09-18 DIAGNOSIS — Z810 Family history of intellectual disabilities: Secondary | ICD-10-CM | POA: Diagnosis not present

## 2016-09-18 MED ORDER — LITHIUM CARBONATE 300 MG PO CAPS: 900.0000 mg | ORAL_CAPSULE | Freq: Every day | ORAL | 0 refills | Status: DC

## 2016-09-18 MED ORDER — IBUPROFEN 200 MG PO TABS
600.0000 mg | ORAL_TABLET | Freq: Once | ORAL | Status: AC
  Administered 2016-09-18: 600 mg via ORAL
  Filled 2016-09-18: qty 3

## 2016-09-18 MED ORDER — ALPRAZOLAM 0.5 MG PO TABS: 0.5000 mg | ORAL_TABLET | Freq: Three times a day (TID) | ORAL | 0 refills | Status: DC | PRN

## 2016-09-18 NOTE — Discharge Instructions (Signed)
For your ongoing mental health needs, you are advised to follow up with your current psychiatrist Dr. Jennelle Human.

## 2016-09-18 NOTE — BHH Suicide Risk Assessment (Signed)
[facility] Discharge Suicide Risk Assessment   Principal Problem: <principal problem not specified> Discharge Diagnoses:  Patient Active Problem List   Diagnosis Date Noted  . Dysesthesia [R20.8] 09/15/2016  . Cold sore [B00.1] 06/18/2016  . Ectatic aorta (HCC) [I77.819] 02/09/2016  . Allergic urticaria [L50.0] 10/28/2015  . Diabetes type 2, controlled (HCC) [E11.9] 07/09/2015  . 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 Encounter for preventive health examination [Z00.00] 05/01/2015  . BMI 34.0-34.9,adult [Z68.34] 05/01/2015  . Common migraine without intractability [G43.009] 11/20/2014  . Other fatigue [R53.83] 04/15/2014  . Depression [F32.9] 04/15/2014  . MS (multiple sclerosis) (HCC) [G35] 01/08/2014  . Multiple sclerosis (HCC) [G35] 12/27/2013    Total Time spent with patient: 1 hour Musculoskeletal: Strength & Muscle Tone: within normal limits Gait & Station: normal Patient leans: no lean   Psychiatric Specialty Exam: ROS  Blood pressure (!) 100/59, pulse 71, temperature 98.4 F (36.9 C), temperature source Oral, resp. rate 17, height 6' (1.829 m), weight 108.9 kg (240 lb), SpO2 98 %.Body mass index is 32.55 kg/m.   General Appearance: Casual  Eye Contact:  Fair  Speech:  Normal Rate  Volume:  Normal  Mood:  somewhat sad but not hopeless'  Affect:  Congruent  Thought Process:  Goal Directed  Orientation:  Full (Time, Place, and Person)  Thought Content:  Rumination  Suicidal Thoughts:  No  Homicidal Thoughts:  No  Memory:  Immediate;   Fair Recent;   Fair  Judgement:  Fair  Insight:  Fair  Psychomotor Activity:  Normal  Concentration:  Concentration: Fair and Attention Span: Fair  Recall:  [employer] of Knowledge:  Fair  Language:  Good  Akathisia:  Negative  Handed:  Right  AIMS (if indicated):     Assets:  Desire for Improvement Social Support  ADL's:  Intact  Cognition:  WNL  Sleep:        Mental Status Per Nursing Assessment::   On  Admission:     Demographic Factors:  White married female  Loss Factors: female, married  Historical Factors: history of depression  Risk Reduction Factors:   Sense of responsibility to family, Positive therapeutic relationship and Positive coping skills or problem solving skills  Continued Clinical Symptoms:  Dysthymia Previous Psychiatric Diagnoses and Treatments  Cognitive Features That Contribute To Risk:  Alert oriented , cooperative , insight improved  Suicide Risk:  Minimal: No identifiable suicidal ideation.  Patients presenting with no risk factors but with morbid ruminations; may be classified as minimal risk based on the severity of the depressive symptoms    Plan Of Care/Follow-up recommendations:  Activity:  as tolerated Diet:  regular  Thresa Ross, MD 09/18/2016, 11:08 AM

## 2016-09-18 NOTE — Consult Note (Signed)
[facility] Consult   Reason for Consult:  Depression and evaluation Referring Physician:  ED Patient Identification: Anita Black MRN:  [id number] Principal Diagnosis: <principal problem not specified> Diagnosis:   Patient Active Problem List   Diagnosis Date Noted  . Dysesthesia [R20.8] 09/15/2016  . Cold sore [B00.1] 06/18/2016  . Ectatic aorta ([place]) [I77.819] 02/09/2016  . Allergic urticaria [L50.0] 10/28/2015  . Diabetes type 2, controlled ([place]) [E11.9] 07/09/2015  . 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 Encounter for preventive health examination [Z00.00] 05/01/2015  . BMI 34.0-34.9,adult [Z68.34] 05/01/2015  . Common migraine without intractability [G43.009] 11/20/2014  . Other fatigue [R53.83] 04/15/2014  . Depression [F32.9] 04/15/2014  . MS (multiple sclerosis) ([place]) [G35] 01/08/2014  . Multiple sclerosis ([place]) [G35] 12/27/2013    Total Time spent with patient: 1 hour  Subjective:   Anita Black is a 36 y.o. female patient admitted with history of depression. Referred for evaluation of risk and depression.  HPI:  Patient is a 36 year old female that has reported increase depression and suicidal ideation at her psychiatrist office yesterday. She was sent here for further assessment and plan.  She has been on lithium in past did ok but was stopped recently as they were planning to have a baby. Her husband wanted. Without lithium she has been more depressed and was suicidal.  She did start back lithium but it was being tapered to reasonable dose of now '900mg'$ . She feels better today. Husband was here and agrees he will observe and keep her medications.  She does not want to be admitted and feel she be much better if she is around her husband and support system at home including her child  She is not voicing suicidal intent or overdose. She feels mood is improved and wants to continue lithium and follow up outpatient.  Husband did later come in  during evaluation and is willing to give full support to her.   Patient denies drug use.  No manic symptoms or pyschosis   Past Psychiatric History: Recurrent Depression   Risk to Self: Suicidal Ideation: Yes-Currently Present Suicidal Intent: Yes-Currently Present Is patient at risk for suicide?: Yes Suicidal Plan?: Yes-Currently Present Specify Current Suicidal Plan: Plan to overdose on medication Access to Means: Yes Specify Access to Suicidal Means: Pills  What has been your use of drugs/alcohol within the last 12 months?: None Reported How many times?: 0 Other Self Harm Risks: None Reported Triggers for Past Attempts: None known Intentional Self Injurious Behavior: None Risk to Others: Homicidal Ideation: No Thoughts of Harm to Others: No Current Homicidal Intent: No Current Homicidal Plan: No Access to Homicidal Means: No Identified Victim: NA History of harm to others?: No Assessment of Violence: None Noted Violent Behavior Description: NA Does patient have access to weapons?: No Criminal Charges Pending?: No Does patient have a court date: No Prior Inpatient Therapy: Prior Inpatient Therapy: No Prior Therapy Dates: NA Prior Therapy Facilty/Provider(s): NA Reason for Treatment: NA Prior Outpatient Therapy: Prior Outpatient Therapy: Yes Prior Therapy Dates: Ongoing  Prior Therapy Facilty/Provider(s): Dr. Clovis Pu Reason for Treatment: Medication Management  Does patient have an ACCT team?: No Does patient have Intensive In-House Services?  : No Does patient have [facility] services? : No Does patient have P4CC services?: No  Past Medical History:  Past Medical History:  Diagnosis Date  . Anxiety   . Depression   . Diabetes type 2, controlled ([place])   . Elevated hemoglobin A1c   .  Hyperlipidemia   . Migraine   . Multiple sclerosis ([facility])    receives Tysabri infusion q 28 days    Past Surgical History:  Procedure Laterality Date  . ABDOMINOPLASTY    .  CESAREAN SECTION    . CHOLECYSTECTOMY  2001   Family History:  Family History  Problem Relation Age of Onset  . Diabetes Mother   . Hypertension Mother   . Dementia Mother   . Bipolar disorder Mother   . Heart failure Father   . Hypertension Father   . Heart disease Father   . Ovarian cancer Maternal Grandmother   . Alzheimer's disease Paternal Grandmother   . Skin cancer Maternal Grandfather     Social History:  History  Alcohol Use  . Yes    Comment: socially; once a week     History  Drug Use No    Social History   Social History  . Marital status: Married    Spouse name: [name]  . Number of children: 1  . Years of education: MA   Occupational History  .  [employer] History Main Topics  . Smoking status: Never Smoker  . Smokeless tobacco: Never Used  . Alcohol use Yes     Comment: socially; once a week  . Drug use: No  . Sexual activity: Yes   Other Topics Concern  . None   Social History Narrative   Patient lives at home with family.   Caffeine Use: 1-2 cups daily   No tobacco, recreational drugs. Social drinking only.   Wears her seatbelt, has smoke alarm in the home.   Feels safe in her relationships.   Additional Social History:    Allergies:  No Known Allergies  Labs:  Results for orders placed or performed during the hospital encounter of 09/17/16 (from the past 48 hour(s))  Comprehensive metabolic panel     Status: Abnormal   Collection Time: 09/17/16  1:53 PM  Result Value Ref Range   Sodium 139 135 - 145 mmol/L   Potassium 3.8 3.5 - 5.1 mmol/L   Chloride 106 101 - 111 mmol/L   CO2 21 (L) 22 - 32 mmol/L   Glucose, Bld 102 (H) 65 - 99 mg/dL   BUN 13 6 - 20 mg/dL   Creatinine, Ser 0.69 0.44 - 1.00 mg/dL   Calcium 9.3 8.9 - 10.3 mg/dL   Total Protein 7.6 6.5 - 8.1 g/dL   Albumin 4.2 3.5 - 5.0 g/dL   AST 17 15 - 41 U/L   ALT 16 14 - 54 U/L   Alkaline Phosphatase 101 38 - 126 U/L   Total Bilirubin 0.9 0.3 - 1.2 mg/dL    GFR calc non Af Amer >60 >60 mL/min   GFR calc Af Amer >60 >60 mL/min    Comment: (NOTE) The eGFR has been calculated using the CKD EPI equation. This calculation has not been validated in all clinical situations. eGFR's persistently <60 mL/min signify possible Chronic Kidney Disease.    Anion gap 12 5 - 15  CBC with Differential     Status: None   Collection Time: 09/17/16  1:53 PM  Result Value Ref Range   WBC 8.4 4.0 - 10.5 K/uL   RBC 4.86 3.87 - 5.11 MIL/uL   Hemoglobin 12.8 12.0 - 15.0 g/dL   HCT 39.0 36.0 - 46.0 %   MCV 80.2 78.0 - 100.0 fL   MCH 26.3 26.0 - 34.0 pg   MCHC 32.8  30.0 - 36.0 g/dL   RDW 14.2 11.5 - 15.5 %   Platelets 251 150 - 400 K/uL   Neutrophils Relative % 55 %   Lymphocytes Relative 41 %   Monocytes Relative 3 %   Eosinophils Relative 1 %   Basophils Relative 0 %   Neutro Abs 4.6 1.7 - 7.7 K/uL   Lymphs Abs 3.4 0.7 - 4.0 K/uL   Monocytes Absolute 0.3 0.1 - 1.0 K/uL   Eosinophils Absolute 0.1 0.0 - 0.7 K/uL   Basophils Absolute 0.0 0.0 - 0.1 K/uL   Smear Review MORPHOLOGY UNREMARKABLE   Ethanol     Status: None   Collection Time: 09/17/16  1:54 PM  Result Value Ref Range   Alcohol, Ethyl (B) <5 <5 mg/dL    Comment:        LOWEST DETECTABLE LIMIT FOR SERUM ALCOHOL IS 5 mg/dL FOR MEDICAL PURPOSES ONLY   Acetaminophen level     Status: Abnormal   Collection Time: 09/17/16  1:54 PM  Result Value Ref Range   Acetaminophen (Tylenol), Serum <10 (L) 10 - 30 ug/mL    Comment:        THERAPEUTIC CONCENTRATIONS VARY SIGNIFICANTLY. A RANGE OF 10-30 ug/mL MAY BE AN EFFECTIVE CONCENTRATION FOR MANY PATIENTS. HOWEVER, SOME ARE BEST TREATED AT CONCENTRATIONS OUTSIDE THIS RANGE. ACETAMINOPHEN CONCENTRATIONS >150 ug/mL AT 4 HOURS AFTER INGESTION AND >50 ug/mL AT 12 HOURS AFTER INGESTION ARE OFTEN ASSOCIATED WITH TOXIC REACTIONS.   Salicylate level     Status: None   Collection Time: 09/17/16  1:54 PM  Result Value Ref Range   Salicylate Lvl <8.4  2.8 - 30.0 mg/dL  Urine rapid drug screen (hosp performed)     Status: Abnormal   Collection Time: 09/17/16  1:54 PM  Result Value Ref Range   Opiates NONE DETECTED NONE DETECTED   Cocaine NONE DETECTED NONE DETECTED   Benzodiazepines POSITIVE (A) NONE DETECTED   Amphetamines POSITIVE (A) NONE DETECTED   Tetrahydrocannabinol NONE DETECTED NONE DETECTED   Barbiturates NONE DETECTED NONE DETECTED    Comment:        DRUG SCREEN FOR MEDICAL PURPOSES ONLY.  IF CONFIRMATION IS NEEDED FOR ANY PURPOSE, NOTIFY LAB WITHIN 5 DAYS.        LOWEST DETECTABLE LIMITS FOR URINE DRUG SCREEN Drug Class       Cutoff (ng/mL) Amphetamine      1000 Barbiturate      200 Benzodiazepine   166 Tricyclics       063 Opiates          300 Cocaine          300 THC              50   Urinalysis, Routine w reflex microscopic     Status: Abnormal   Collection Time: 09/17/16  1:54 PM  Result Value Ref Range   Color, Urine YELLOW YELLOW   APPearance HAZY (A) CLEAR   Specific Gravity, Urine 1.024 1.005 - 1.030   pH 5.0 5.0 - 8.0   Glucose, UA NEGATIVE NEGATIVE mg/dL   Hgb urine dipstick NEGATIVE NEGATIVE   Bilirubin Urine NEGATIVE NEGATIVE   Ketones, ur NEGATIVE NEGATIVE mg/dL   Protein, ur NEGATIVE NEGATIVE mg/dL   Nitrite NEGATIVE NEGATIVE   Leukocytes, UA NEGATIVE NEGATIVE  POC Urine Pregnancy, ED (do NOT order at [facility])     Status: None   Collection Time: 09/17/16  3:29 PM  Result Value Ref Range   Preg  Test, Ur NEGATIVE NEGATIVE    Comment:        THE SENSITIVITY OF THIS METHODOLOGY IS >24 mIU/mL     Current Facility-Administered Medications  Medication Dose Route Frequency Provider Last Rate Last Dose  . ALPRAZolam Duanne Moron) tablet 0.5 mg  0.5 mg Oral QID PRN Tegeler, Gwenyth Allegra, MD   0.5 mg at 09/18/16 0415  . drospirenone-ethinyl estradiol (YAZ,GIANVI,LORYNA) 3-0.02 MG per tablet 1 tablet  1 tablet Oral QHS Davonna Belling, MD      . gabapentin (NEURONTIN) capsule 300 mg  300 mg Oral BID  Davonna Belling, MD   300 mg at 09/18/16 0936  . lisdexamfetamine ([name]) capsule 60 mg  60 mg Oral Claudie Revering, MD   60 mg at 09/18/16 1005  . lithium carbonate capsule 900 mg  900 mg Oral Daily Tegeler, Gwenyth Allegra, MD   900 mg at 09/18/16 0936  . metFORMIN (GLUCOPHAGE) tablet 1,000 mg  1,000 mg Oral BID WC Davonna Belling, MD   1,000 mg at 09/18/16 0811  . SUMAtriptan (IMITREX) tablet 100 mg  100 mg Oral Q2H PRN Thomes Lolling, RPH      . vortioxetine HBr (TRINTELLIX) 20 MG tablet 20 mg  20 mg Oral Daily Davonna Belling, MD   20 mg at 09/18/16 1324   Current Outpatient Prescriptions  Medication Sig Dispense Refill  . acetaminophen (TYLENOL) 500 MG tablet Take 1,000 mg by mouth every 6 (six) hours as needed.    [name] alprazolam (XANAX) 2 MG tablet Take 2 mg by mouth as needed for anxiety.    [name] ibuprofen (ADVIL,MOTRIN) 200 MG tablet Take 800 mg by mouth every 6 (six) hours as needed.    [name] ketorolac (TORADOL) 30 MG/ML injection One cc IM prn migraine.  No more than 2/day Dispense with 8 (eight)    3cc syringes with 1 inch 25g needles 8 mL 11  . lisdexamfetamine ([name]) 60 MG capsule Take 1 capsule (60 mg total) by mouth every morning. 30 capsule 0  . lithium carbonate 300 MG capsule Take 900 mg by mouth at bedtime.     . LORYNA 3-0.02 MG tablet TAKE ONE TABLET EACH DAY  3  . metFORMIN (GLUCOPHAGE) 500 MG tablet 1000 mg BID with meals. Needs appt prior to anymore refills. 120 tablet 5  . natalizumab (TYSABRI) 300 MG/15ML injection Inject into the vein. Every 28 days    . rizatriptan (MAXALT-MLT) 10 MG disintegrating tablet Take 1 tablet (10 mg total) by mouth as needed for migraine. May repeat in 2 hours if needed 9 tablet 11  . traMADol (ULTRAM) 50 MG tablet Take 1 tablet (50 mg total) by mouth every 12 (twelve) hours as needed. 30 tablet 0  . TRINTELLIX 20 MG TABS Take 20 mg by mouth daily. 30 tablet 5  . valACYclovir (VALTREX) 1000 MG tablet 2000 mg every 12 hours for  2 doses. 8 tablet 3  . BD SAFETYGLIDE SHIELDED NEEDLE 25G X 1" MISC USE AS DIRECTED 10 each 7  . gabapentin (NEURONTIN) 300 MG capsule One po qAM, one po qPM and two po qHS 120 capsule 5  . indomethacin (INDOCIN) 25 MG capsule Take 1 capsule (25 mg total) by mouth 3 (three) times daily as needed. 30 capsule 0  . predniSONE (DELTASONE) 20 MG tablet 60 mg x3d, 40 mg x3d, 20 mg x2d, 10 mg x2d 18 tablet 0    Musculoskeletal: Strength & Muscle Tone: within normal limits Gait & Station: normal Patient leans:  no lean  Psychiatric Specialty Exam: Physical Exam  Constitutional: She appears well-developed and well-nourished.  Skin: She is not diaphoretic.    Review of Systems  Cardiovascular: Negative for chest pain.  Skin: Negative for rash.  Psychiatric/Behavioral: Positive for depression. Negative for suicidal ideas.    Blood pressure (!) 100/59, pulse 71, temperature 98.4 F (36.9 C), temperature source Oral, resp. rate 17, height 6' (1.829 m), weight 108.9 kg (240 lb), SpO2 98 %.Body mass index is 32.55 kg/m.  General Appearance: Casual  Eye Contact:  Fair  Speech:  Normal Rate  Volume:  Normal  Mood:  somewhat sad but not hopeless'  Affect:  Congruent  Thought Process:  Goal Directed  Orientation:  Full (Time, Place, and Person)  Thought Content:  Rumination  Suicidal Thoughts:  No  Homicidal Thoughts:  No  Memory:  Immediate;   Fair Recent;   Fair  Judgement:  Fair  Insight:  Fair  Psychomotor Activity:  Normal  Concentration:  Concentration: Fair and Attention Span: Fair  Recall:  [employer] of Knowledge:  Fair  Language:  Good  Akathisia:  Negative  Handed:  Right  AIMS (if indicated):     Assets:  Desire for Improvement Social Support  ADL's:  Intact  Cognition:  WNL  Sleep:        Treatment Plan Summary: Patient is denying suicidal plans. Husband is supportive Does not want to get admitted . Feels better since yesterday She will continue home medications  and be discharged Follow up with outpatient psychiatrist and services   Disposition: Patient does not meet criteria for psychiatric inpatient admission. Supportive therapy provided about ongoing stressors. Discussed crisis plan, support from social network, calling 911, coming to the Emergency Department, and calling Suicide Hotline.  Merian Capron, MD 09/18/2016 10:52 AM

## 2016-09-18 NOTE — ED Notes (Addendum)
Pt awoke and attempted to use the phone and was reminded that the phone doesn't come back on until 9 AM. Pt then asked how her Xanax orders were written.Pt was provided PRN medication at pt request/

## 2016-09-18 NOTE — ED Notes (Addendum)
Pt d/c home per MD order. Discharge summary reviewed with pt. Pt verbalizes understanding of d/c summary. RX provided. Pt denies SI/HI/AVH. Pt has no personal property. Pt signed e-signature. Ambulatory off unit with this nurse. Pt husband in lobby.

## 2016-09-23 ENCOUNTER — Ambulatory Visit (INDEPENDENT_AMBULATORY_CARE_PROVIDER_SITE_OTHER): Payer: Managed Care, Other (non HMO) | Admitting: Neurology

## 2016-09-23 ENCOUNTER — Telehealth: Payer: Self-pay | Admitting: *Deleted

## 2016-09-23 ENCOUNTER — Encounter: Payer: Self-pay | Admitting: Neurology

## 2016-09-23 VITALS — BP 110/74 | HR 87 | Resp 18 | Ht 72.0 in | Wt 245.0 lb

## 2016-09-23 DIAGNOSIS — R42 Dizziness and giddiness: Secondary | ICD-10-CM | POA: Insufficient documentation

## 2016-09-23 DIAGNOSIS — F329 Major depressive disorder, single episode, unspecified: Secondary | ICD-10-CM

## 2016-09-23 DIAGNOSIS — F32A Depression, unspecified: Secondary | ICD-10-CM

## 2016-09-23 DIAGNOSIS — G35 Multiple sclerosis: Secondary | ICD-10-CM | POA: Diagnosis not present

## 2016-09-23 DIAGNOSIS — R269 Unspecified abnormalities of gait and mobility: Secondary | ICD-10-CM | POA: Diagnosis not present

## 2016-09-23 MED ORDER — FLUDROCORTISONE ACETATE 0.1 MG PO TABS
0.1000 mg | ORAL_TABLET | Freq: Every day | ORAL | 3 refills | Status: DC
Start: 2016-09-23 — End: 2016-09-30

## 2016-09-23 NOTE — Telephone Encounter (Signed)
Received email from [facility] this am, that she has new onset vertigo. She will come in now for w/i appt. with RAS. She is aware this is a w/i appt. and there will be a wait/fim

## 2016-09-23 NOTE — Progress Notes (Signed)
[facility]  PATIENT: Anita Black DOB: Dec 24, 1980     HISTORICAL  CHIEF COMPLAINT:  Chief Complaint  Patient presents with  . Multiple Sclerosis    Sts. she continues to tolerate Tysabri well.  JCV ab last checked 12/30/15 and was negative at 0.08.  Here today for dizziness noted upon waking this am.  Sts. dizziness is worse with walking, and not assoc. with position changes.  Believes Lithium dose was increased from 600mg  to 900mg  this week.     HISTORY OF PRESENT ILLNESS:   Anita Black is a 36 year old woman with MS.     This morning when she got out of her bed, she felt very lightheaded and dizzy.    Dizziness improved after she sat back down.   She has had several more episodes today, all while standing up.    She has mild orthostatic tachycardia.     She feels slightly weaker overall but no specific weakness.   She notes no new changes in vision though she does continue to have slight decreased vision from prior optic neuritis.  Last week, she had suicidal ideation and went to the ED.  she was admitted overnight and discharged the next day.  Her lithium was increased to 900 mg.   Rexalti was added.  MS:  Before today, she felt her MS was stable.    She is on Tysabri 300 mg IV monthly and tolerates it well. She denies any MS exacerbation. Her JCV antibody is negative.        MS History:  She was diagnosed with multiple sclerosis in 2009 after presenting with numbness in the arms and torso, primarily in the front of her body. An MRI of the brain was normal but an MRI of the spinal cord showed 2 cervical plaques. She was started on Rebif but had difficulty tolerating the medication and after about a year switched to Copaxone. Unfortunately, she also had trouble tolerating Copaxone. She then was on Copaxone and had injeciotn reactions and switched to Avonex.  After her pregnanacy, she was switched to Gilenya. However, she had bradycardia and that was  discontinued. In January 2015 she started Tysabri and continues on that medication. [place] in July [place]. She continues her Tysabri treatments here.     She is JCV antibody negative. She feels better with the Tysabri infusions in general. However, she gets nausea that lasts several days after each infusion.   MRI Brain from 07/01/14 shows about 10-12 hyperintense foci consistent with MS. No lesions were seen in the posterior fossa.    ROS:  Out of a complete 14 system review of symptoms, the patient complains only of the following symptoms, and all other reviewed systems are negative.   She notes fatigue and depression.   She has headaches, anddifficulty with attention, Snoring, rare bowel incontinence   ALLERGIES: No Known Allergies  HOME MEDICATIONS:  Current Outpatient Prescriptions:  .  acetaminophen (TYLENOL) 500 MG tablet, Take 1,000 mg by mouth every 6 (six) hours as needed., Disp: , Rfl:  .  ALPRAZolam (XANAX) 0.5 MG tablet, Take 1 tablet (0.5 mg total) by mouth 3 (three) times daily as needed for anxiety or sleep., Disp: 16 tablet, Rfl: 0 .  alprazolam (XANAX) 2 MG tablet, Take 2 mg by mouth as needed for anxiety., Disp: , Rfl:  .  BD SAFETYGLIDE SHIELDED NEEDLE 25G X 1" MISC, USE AS DIRECTED, Disp: 10 each, Rfl: 7 .  gabapentin (NEURONTIN) 300 MG capsule, One po qAM, one po qPM and two po qHS, Disp: 120 capsule, Rfl: 5 .  ibuprofen (ADVIL,MOTRIN) 200 MG tablet, Take 800 mg by mouth every 6 (six) hours as needed., Disp: , Rfl:  .  indomethacin (INDOCIN) 25 MG capsule, Take 1 capsule (25 mg total) by mouth 3 (three) times daily as needed., Disp: 30 capsule, Rfl: 0 .  ketorolac (TORADOL) 30 MG/ML injection, One cc IM prn migraine.  No more than 2/day Dispense with 8 (eight)    3cc syringes with 1 inch 25g needles, Disp: 8 mL, Rfl: 11 .  lisdexamfetamine ([name]) 60 MG capsule, Take 1 capsule (60 mg total) by mouth every morning., Disp: 30 capsule, Rfl: 0 .  lithium  carbonate 300 MG capsule, Take 900 mg by mouth at bedtime. , Disp: , Rfl:  .  lithium carbonate 300 MG capsule, Take 3 capsules (900 mg total) by mouth daily., Disp: 90 capsule, Rfl: 0 .  LORYNA 3-0.02 MG tablet, TAKE ONE TABLET EACH DAY, Disp: , Rfl: 3 .  metFORMIN (GLUCOPHAGE) 500 MG tablet, 1000 mg BID with meals. Needs appt prior to anymore refills., Disp: 120 tablet, Rfl: 5 .  natalizumab (TYSABRI) 300 MG/15ML injection, Inject into the vein. Every 28 days, Disp: , Rfl:  .  predniSONE (DELTASONE) 20 MG tablet, 60 mg x3d, 40 mg x3d, 20 mg x2d, 10 mg x2d, Disp: 18 tablet, Rfl: 0 .  rizatriptan (MAXALT-MLT) 10 MG disintegrating tablet, Take 1 tablet (10 mg total) by mouth as needed for migraine. May repeat in 2 hours if needed, Disp: 9 tablet, Rfl: 11 .  traMADol (ULTRAM) 50 MG tablet, Take 1 tablet (50 mg total) by mouth every 12 (twelve) hours as needed., Disp: 30 tablet, Rfl: 0 .  TRINTELLIX 20 MG TABS, Take 20 mg by mouth daily., Disp: 30 tablet, Rfl: 5 .  valACYclovir (VALTREX) 1000 MG tablet, 2000 mg every 12 hours for 2 doses., Disp: 8 tablet, Rfl: 3  PAST MEDICAL HISTORY: Past Medical History:  Diagnosis Date  . Anxiety   . Depression   . Diabetes type 2, controlled (HCC)   . Elevated hemoglobin A1c   . Hyperlipidemia   . Migraine   . Multiple sclerosis (HCC)    receives Tysabri infusion q 28 days    PAST SURGICAL HISTORY: Past Surgical History:  Procedure Laterality Date  . ABDOMINOPLASTY    . CESAREAN SECTION    . CHOLECYSTECTOMY  2001    FAMILY HISTORY: Family History  Problem Relation Age of Onset  . Diabetes Mother   . Hypertension Mother   . Dementia Mother   . Bipolar disorder Mother   . Heart failure Father   . Hypertension Father   . Heart disease Father   . Ovarian cancer Maternal Grandmother   . Alzheimer's disease Paternal Grandmother   . Skin cancer Maternal Grandfather     SOCIAL HISTORY:  Social History   Social History  . Marital status:  Married    Spouse name: Rollen Sox  . Number of children: 1  . Years of education: MA   Occupational History  .  [employer]   Social History Main Topics  . Smoking status: Never Smoker  . Smokeless tobacco: Never Used  . Alcohol use Yes     Comment: socially; once a week  . Drug use: No  . Sexual activity: Yes   Other Topics Concern  . Not on file   Social History Narrative  Patient lives at home with family.   Caffeine Use: 1-2 cups daily   No tobacco, recreational drugs. Social drinking only.   Wears her seatbelt, has smoke alarm in the home.   Feels safe in her relationships.     PHYSICAL EXAM  Vitals:   09/23/16 1146  BP: 110/74  Pulse: 87  Resp: 18  Weight: 245 lb (111.1 kg)  Height: 6' (1.829 m)    Body mass index is 33.23 kg/m.  Orthostatic VS for the past 24 hrs:  BP- Lying Pulse- Lying BP- Sitting Pulse- Sitting BP- Standing at 0 minutes Pulse- Standing at 0 minutes  09/23/16 1147 110/84 87 121/84 105 123/87 108      General: The patient is well-developed and well-nourished and in no acute distress   Neurologic Exam  Mental status: The patient is alert and oriented x 3 at the time of the examination. The patient has apparent normal recent and remote memory, with an apparently normal attention span and concentration ability.   Speech is normal.  Cranial nerves: Extraocular movements are full.  Facial symmetry is present. There is good facial sensation to soft touch bilaterally.Facial strength is normal.  Trapezius and sternocleidomastoid strength is normal. No dysarthria is noted. No obvious hearing deficits are noted.  Motor:  Muscle bulk is normal.   Tone is normal. Strength is  5 / 5 in all 4 extremities.   Sensory: Sensory testing is intact to  Touch sensation in all 4 extremities.  Coordination: Cerebellar testing reveals good finger-nose-finger bilaterally.  Gait and station: Station is normal.   Gait is mildly wide.   Tandem gait is  wide. Romberg is borderline.   Reflexes: Deep tendon reflexes are symmetric and 1+ bilaterally.     DIAGNOSTIC DATA (LABS, IMAGING, TESTING) - I reviewed patient records, labs, notes, testing and imaging myself where available.  Lab Results  Component Value Date   WBC 8.4 09/17/2016   HGB 12.8 09/17/2016   HCT 39.0 09/17/2016   MCV 80.2 09/17/2016   PLT 251 09/17/2016      Component Value Date/Time   NA 139 09/17/2016 1353   NA 137 04/15/2014 1554   K 3.8 09/17/2016 1353   CL 106 09/17/2016 1353   CO2 21 (L) 09/17/2016 1353   GLUCOSE 102 (H) 09/17/2016 1353   BUN 13 09/17/2016 1353   BUN 15 04/15/2014 1554   CREATININE 0.69 09/17/2016 1353   CREATININE 0.90 01/30/2016 1555   CALCIUM 9.3 09/17/2016 1353   PROT 7.6 09/17/2016 1353   PROT 6.9 12/29/2015 1400   ALBUMIN 4.2 09/17/2016 1353   ALBUMIN 4.5 12/29/2015 1400   AST 17 09/17/2016 1353   ALT 16 09/17/2016 1353   ALKPHOS 101 09/17/2016 1353   BILITOT 0.9 09/17/2016 1353   BILITOT 0.6 12/29/2015 1400   GFRNONAA >60 09/17/2016 1353   GFRAA >60 09/17/2016 1353       ASSESSMENT AND PLAN  Multiple sclerosis (HCC) - Plan: Lithium level  Lightheaded - Plan: Lithium level, Basic metabolic panel  Gait disturbance  Depression, unspecified depression type - Plan: Lithium level     1.  Continue Tysabri. 2.   She will continue to see Dr. Jennelle Human for her psychologic issues. 3.  Her lithium dose was increased when she was in the hospital. I will check a level today to make sure that it is not contributing to her symptoms. I gave her a prescription for fludrocortisone if she continues to have lightheadedness and tachycardia upon  standing, she can take this to see if it helps. I recommend that she wait a couple days to see if her symptoms improved before starting any new medication  4.  Return to clinic as scheduled months for a regular follow-up visit or call sooner if new or worsening neurologic symptoms.   Richard  A. Epimenio Foot, MD, PhD 09/23/2016, 11:52 AM Certified in Neurology, Clinical Neurophysiology, Sleep Medicine, Pain Medicine and Neuroimaging  [facility] [address], [address] [place], [place] [address] [phone] [[k

## 2016-09-30 ENCOUNTER — Encounter: Payer: Self-pay | Admitting: Family Medicine

## 2016-09-30 ENCOUNTER — Ambulatory Visit (INDEPENDENT_AMBULATORY_CARE_PROVIDER_SITE_OTHER): Payer: Managed Care, Other (non HMO) | Admitting: Family Medicine

## 2016-09-30 VITALS — BP 130/83 | HR 78 | Temp 98.1°F | Resp 20 | Wt 247.5 lb

## 2016-09-30 DIAGNOSIS — R197 Diarrhea, unspecified: Secondary | ICD-10-CM

## 2016-09-30 DIAGNOSIS — E119 Type 2 diabetes mellitus without complications: Secondary | ICD-10-CM

## 2016-09-30 LAB — POCT GLYCOSYLATED HEMOGLOBIN (HGB A1C): Hemoglobin A1C: 6.5

## 2016-09-30 NOTE — Progress Notes (Signed)
Anita Black , 10/14/1980, 36 y.o., female MRN: [id number] Patient Care Team    Relationship Specialty Notifications Start End  Anita Leatherwood, DO PCP - General Family Medicine  02/26/15   Anita Honour, DO Consulting Physician Obstetrics and Gynecology  06/18/16   Anita Lente, MD  Neurology  06/18/16     Chief Complaint  Patient presents with  . Diabetes    blood sugars dropping possibly due to ne medication    Subjective:   Diabetes:  Agent reports compliance with metformin 1000 mg twice a day. She feels that her blood sugars are dropping. She is becoming shaky and feeling flushed. She admits that when she feels this way and she checks her blood sugars the lowest that she has seen has been 100. She has had multiple bouts of diarrhea since starting new medications for her depression/anxiety 2 weeks ago. This is the same time as the onset as her shakiness. PNA series: will offer again  next follow for DM Flu shot: 2016 (recommneded yearly) BMP: Normal Foot exam: 2017 Eye exam: 2016 A1c: 6.0--> 6.4--> 6.5  today  No Known Allergies Social History  Substance Use Topics  . Smoking status: Never Smoker  . Smokeless tobacco: Never Used  . Alcohol use Yes     Comment: socially; once a week   Past Medical History:  Diagnosis Date  . Anxiety   . Depression   . Diabetes type 2, controlled (HCC)   . Elevated hemoglobin A1c   . Hyperlipidemia   . Migraine   . Multiple sclerosis (HCC)    receives Tysabri infusion q 28 days   Past Surgical History:  Procedure Laterality Date  . ABDOMINOPLASTY    . CESAREAN SECTION    . CHOLECYSTECTOMY  2001   Family History  Problem Relation Age of Onset  . Diabetes Mother   . Hypertension Mother   . Dementia Mother   . Bipolar disorder Mother   . Heart failure Father   . Hypertension Father   . Heart disease Father   . Ovarian cancer Maternal Grandmother   . Alzheimer's disease Paternal Grandmother   . Skin cancer Maternal  Grandfather    Allergies as of 09/30/2016   No Known Allergies     Medication List       Accurate as of 09/30/16  9:36 AM. Always use your most recent med list.          acetaminophen 500 MG tablet Commonly known as:  TYLENOL Take 1,000 mg by mouth every 6 (six) hours as needed.   ALPRAZolam 0.5 MG tablet Commonly known as:  XANAX Take 1 tablet (0.5 mg total) by mouth 3 (three) times daily as needed for anxiety or sleep.   BD SAFETYGLIDE SHIELDED NEEDLE 25G X 1" Misc Generic drug:  NEEDLE (DISP) 25 G USE AS DIRECTED   gabapentin 300 MG capsule Commonly known as:  NEURONTIN One po qAM, one po qPM and two po qHS   glycopyrrolate 2 MG tablet Commonly known as:  ROBINUL daily.   ibuprofen 200 MG tablet Commonly known as:  ADVIL,MOTRIN Take 800 mg by mouth every 6 (six) hours as needed.   ketorolac 30 MG/ML injection Commonly known as:  TORADOL One cc IM prn migraine.  No more than 2/day Dispense with 8 (eight)    3cc syringes with 1 inch 25g needles   lisdexamfetamine 60 MG capsule Commonly known as:  [name] Take 1 capsule (60 mg total) by  mouth every morning.   lithium carbonate 300 MG capsule Take 900 mg by mouth at bedtime.   LORYNA 3-0.02 MG tablet Generic drug:  drospirenone-ethinyl estradiol TAKE ONE TABLET EACH DAY   metFORMIN 500 MG tablet Commonly known as:  GLUCOPHAGE 1000 mg BID with meals. Needs appt prior to anymore refills.   predniSONE 20 MG tablet Commonly known as:  DELTASONE 60 mg x3d, 40 mg x3d, 20 mg x2d, 10 mg x2d   REXULTI 1 MG Tabs Generic drug:  Brexpiprazole 1 tablet daily.   rizatriptan 10 MG disintegrating tablet Commonly known as:  MAXALT-MLT Take 1 tablet (10 mg total) by mouth as needed for migraine. May repeat in 2 hours if needed   traMADol 50 MG tablet Commonly known as:  ULTRAM Take 1 tablet (50 mg total) by mouth every 12 (twelve) hours as needed.   TRINTELLIX 20 MG Tabs Generic drug:  vortioxetine HBr Take 20 mg  by mouth daily.   valACYclovir 1000 MG tablet Commonly known as:  VALTREX 2000 mg every 12 hours for 2 doses.       No results found for this or any previous visit (from the past 24 hour(s)). No results found.   ROS: Negative, with the exception of above mentioned in HPI   Objective:  BP 130/83 (BP Location: Right Arm, Patient Position: Sitting, Cuff Size: Large)   Pulse 78   Temp 98.1 F (36.7 C)   Resp 20   Wt 247 lb 8 oz (112.3 kg)   SpO2 98%   BMI 33.57 kg/m  Body mass index is 33.57 kg/m. Gen: Afebrile. No acute distress. Very pleasant, Caucasian female. HENT: AT. [place]. MMM. Eyes:Pupils Equal Round Reactive to light, Extraocular movements intact,  Conjunctiva without redness, discharge or icterus. Neck/lymp/endocrine: Supple, no lymphadenopathy, no thyromegaly CV: RRR, no edema, +2/4 P posterior tibialis pulses Chest: CTAB, no wheeze or crackles Skin: No rashes, purpura or petechiae.  Neuro:  Normal gait. PERLA. EOMi. Alert. Oriented.  Psych: Normal affect, dress and demeanor. Normal speech. Normal thought content and judgment. Appears happy, and well today.    Assessment/Plan: Anita Black is a 36 y.o. female present for OV for  Controlled type 2 diabetes mellitus without complication, without long-term current use of insulin (HCC) - Continue metformin 1000 mg twice a day. Her A1c is 6.5 today which is actually just had a bit higher than her prior collection 3 months ago. Reassured her that her symptoms are likely not coming from blood sugars that are too low. It can either be that she is not used to running around 100, however this is normal. Or these could be side effects from dehydration from her chronic diarrhea since she started her new medications. I have asked her to make sure to increase her water consumption. Flu shot: 2016 (recommneded yearly) BMP: Normal Foot exam: 2017 Eye exam: 2016 A1c: 6.0--> 6.4 --> 6.5 - If diarrhea continues after changes in  depression medications, within consider taking her off metformin and starting a DPP4-I. - Follow with OB for management if conceives.Otherwise, would want to see every 4 months for DM.     electronically signed by:  Felix Pacini, DO  [facility] [place]

## 2016-09-30 NOTE — Patient Instructions (Signed)
It is not your sugars. Be reassured.  If diarrhea continues, come back in and we will try to decrease metformin.

## 2016-10-21 ENCOUNTER — Encounter: Payer: Self-pay | Admitting: Family Medicine

## 2016-10-21 ENCOUNTER — Ambulatory Visit (INDEPENDENT_AMBULATORY_CARE_PROVIDER_SITE_OTHER): Payer: Managed Care, Other (non HMO) | Admitting: Family Medicine

## 2016-10-21 VITALS — BP 107/75 | HR 75 | Temp 98.0°F | Resp 20 | Wt 251.2 lb

## 2016-10-21 DIAGNOSIS — G43819 Other migraine, intractable, without status migrainosus: Secondary | ICD-10-CM

## 2016-10-21 MED ORDER — CYCLOBENZAPRINE HCL 5 MG PO TABS: 5.0000 mg | ORAL_TABLET | Freq: Two times a day (BID) | ORAL | 0 refills | Status: DC | PRN

## 2016-10-21 MED ORDER — KETOROLAC TROMETHAMINE 60 MG/2ML IM SOLN
60.0000 mg | Freq: Once | INTRAMUSCULAR | Status: AC
  Administered 2016-10-21: 60 mg via INTRAMUSCULAR

## 2016-10-21 MED ORDER — ONDANSETRON HCL 4 MG/2ML IJ SOLN
4.0000 mg | Freq: Once | INTRAMUSCULAR | Status: AC
  Administered 2016-10-21: 4 mg via INTRAMUSCULAR

## 2016-10-21 MED ORDER — PROMETHAZINE HCL 25 MG PO TABS: 25.0000 mg | ORAL_TABLET | Freq: Three times a day (TID) | ORAL | 0 refills | Status: DC | PRN

## 2016-10-21 NOTE — Patient Instructions (Signed)
Toradol injection today. Try to avoid 2 nd injection later unless absolutely needing since this was a higher dose injection.  Zofran injection for nausea.  When you go home take 50 mg benadryl and lay down.  If this does not break your nigraine try the flexeril this evening --> can cause sedation.  Never use Toradol more than 5 days.  Phenergan refilled. You can use Zofran between phenergan doses.  Headache clinic referral placed.    If headache continues please be seen in ED.     Migraine Headache A migraine headache is a very strong throbbing pain on one side or both sides of your head. Migraines can also cause other symptoms. Talk with your doctor about what things may bring on (trigger) your migraine headaches. Follow these instructions at home: Medicines  Take over-the-counter and prescription medicines only as told by your doctor.  Do not drive or use heavy machinery while taking prescription pain medicine.  To prevent or treat constipation while you are taking prescription pain medicine, your doctor may recommend that you: ? Drink enough fluid to keep your pee (urine) clear or pale yellow. ? Take over-the-counter or prescription medicines. ? Eat foods that are high in fiber. These include fresh fruits and vegetables, whole grains, and beans. ? Limit foods that are high in fat and processed sugars. These include fried and sweet foods. Lifestyle  Avoid alcohol.  Do not use any products that contain nicotine or tobacco, such as cigarettes and e-cigarettes. If you need help quitting, ask your doctor.  Get at least 8 hours of sleep every night.  Limit your stress. General instructions   Keep a journal to find out what may bring on your migraines. For example, write down: ? What you eat and drink. ? How much sleep you get. ? Any change in what you eat or drink. ? Any change in your medicines.  If you have a migraine: ? Avoid things that make your symptoms worse, such as  bright lights. ? It may help to lie down in a dark, quiet room. ? Do not drive or use heavy machinery. ? Ask your doctor what activities are safe for you.  Keep all follow-up visits as told by your doctor. This is important. Contact a doctor if:  You get a migraine that is different or worse than your usual migraines. Get help right away if:  Your migraine gets very bad.  You have a fever.  You have a stiff neck.  You have trouble seeing.  Your muscles feel weak or like you cannot control them.  You start to lose your balance a lot.  You start to have trouble walking.  You pass out (faint). This information is not intended to replace advice given to you by your health care provider. Make sure you discuss any questions you have with your health care provider. Document Released: 02/03/2008 Document Revised: 11/14/2015 Document Reviewed: 10/13/2015 Elsevier Interactive Patient Education  2017 [employer].

## 2016-10-21 NOTE — Progress Notes (Signed)
Anita Black , July 03, 1980, 36 y.o., female MRN: [id number] Patient Care Team    Relationship Specialty Notifications Start End  Natalia Leatherwood, DO PCP - General Family Medicine  02/26/15   Mitchel Honour, DO Consulting Physician Obstetrics and Gynecology  06/18/16   Asa Lente, MD  Neurology  06/18/16     Chief Complaint  Patient presents with  . Migraine    x 4-5 days     Subjective: Pt presents for an OV with complaints of migraine headache of 4 days duration.  Associated symptoms include nausea. She reports this is consistent with her prior migraine headaches. She reports she was traveling last week, and had pressure and facial swelling around her eyes, and then headache followed. She is prescribed Toradol through her neurologist. She has been using this once daily for the past 2 days. She has been prescribed Phenergan in the past, and she has been trying to use this as well, but she has run out today. She took Benadryl 50 mg at bedtime last night. She reports her prior neurologist took care of both her migraines and her MS, but her current neurologist only specializes in MS. She would like referred to the headache clinic in [place].   Depression screen [facility] 2/9 08/04/2016 08/04/2016  Decreased Interest 0 0  Down, Depressed, Hopeless 0 0  PHQ - 2 Score 0 0  Altered sleeping - 0  Tired, decreased energy - 0  Change in appetite - 0  Feeling bad or failure about yourself  - 0  Trouble concentrating - 0  Moving slowly or fidgety/restless - 0  Suicidal thoughts - 0  PHQ-9 Score - 0    No Known Allergies Social History  Substance Use Topics  . Smoking status: Never Smoker  . Smokeless tobacco: Never Used  . Alcohol use Yes     Comment: socially; once a week   Past Medical History:  Diagnosis Date  . Anxiety   . Depression   . Diabetes type 2, controlled (HCC)   . Elevated hemoglobin A1c   . Hyperlipidemia   . Migraine   . Multiple sclerosis (HCC)    receives  Tysabri infusion q 28 days   Past Surgical History:  Procedure Laterality Date  . ABDOMINOPLASTY    . CESAREAN SECTION    . CHOLECYSTECTOMY  2001   Family History  Problem Relation Age of Onset  . Diabetes Mother   . Hypertension Mother   . Dementia Mother   . Bipolar disorder Mother   . Heart failure Father   . Hypertension Father   . Heart disease Father   . Ovarian cancer Maternal Grandmother   . Alzheimer's disease Paternal Grandmother   . Skin cancer Maternal Grandfather    Allergies as of 10/21/2016   No Known Allergies     Medication List       Accurate as of 10/21/16  8:40 AM. Always use your most recent med list.          acetaminophen 500 MG tablet Commonly known as:  TYLENOL Take 1,000 mg by mouth every 6 (six) hours as needed.   ALPRAZolam 0.5 MG tablet Commonly known as:  XANAX Take 1 tablet (0.5 mg total) by mouth 3 (three) times daily as needed for anxiety or sleep.   BD SAFETYGLIDE SHIELDED NEEDLE 25G X 1" Misc Generic drug:  NEEDLE (DISP) 25 G USE AS DIRECTED   gabapentin 300 MG capsule Commonly known as:  NEURONTIN  One po qAM, one po qPM and two po qHS   glycopyrrolate 2 MG tablet Commonly known as:  ROBINUL daily.   ibuprofen 200 MG tablet Commonly known as:  ADVIL,MOTRIN Take 800 mg by mouth every 6 (six) hours as needed.   ketorolac 30 MG/ML injection Commonly known as:  TORADOL One cc IM prn migraine.  No more than 2/day Dispense with 8 (eight)    3cc syringes with 1 inch 25g needles   lisdexamfetamine 60 MG capsule Commonly known as:  [name] Take 1 capsule (60 mg total) by mouth every morning.   lithium carbonate 300 MG capsule Take 900 mg by mouth at bedtime.   LORYNA 3-0.02 MG tablet Generic drug:  drospirenone-ethinyl estradiol TAKE ONE TABLET EACH DAY   metFORMIN 500 MG tablet Commonly known as:  GLUCOPHAGE 1000 mg BID with meals. Needs appt prior to anymore refills.   REXULTI 1 MG Tabs Generic drug:   Brexpiprazole 1 tablet daily.   rizatriptan 10 MG disintegrating tablet Commonly known as:  MAXALT-MLT Take 1 tablet (10 mg total) by mouth as needed for migraine. May repeat in 2 hours if needed   sertraline 100 MG tablet Commonly known as:  ZOLOFT Take 150 mg by mouth daily.   SUMAtriptan 100 MG tablet Commonly known as:  IMITREX Take 100 mg by mouth every 2 (two) hours as needed for migraine. May repeat in 2 hours if headache persists or recurs.   valACYclovir 1000 MG tablet Commonly known as:  VALTREX 2000 mg every 12 hours for 2 doses.       All past medical history, surgical history, allergies, family history, immunizations andmedications were updated in the EMR today and reviewed under the history and medication portions of their EMR.     ROS: Negative, with the exception of above mentioned in HPI   Objective:  BP 107/75 (BP Location: Left Arm, Patient Position: Sitting, Cuff Size: Large)   Pulse 75   Temp 98 F (36.7 C)   Resp 20   Wt 251 lb 4 oz (114 kg)   LMP 10/16/2016   SpO2 97%   BMI 34.08 kg/m  Body mass index is 34.08 kg/m. Gen: Afebrile. No acute distress. Nontoxic in appearance, well developed, well nourished.  HENT: AT. [place]. Bilateral TM visualized without erythema, fullness or bulging. MMM, no oral lesions. Bilateral nares without erythema, mild swelling, no drainage. Throat without erythema or exudates. No cough or hoarseness present. Mild puffiness around the eyes. Eyes:Pupils Equal Round Reactive to light, Extraocular movements intact,  Conjunctiva without redness, discharge or icterus. Neck/lymp/endocrine: Supple, no lymphadenopathy CV: RRR  Chest: CTAB, no wheeze or crackles.  Skin: no rashes, purpura or petechiae.  Neuro: Normal gait. PERLA. EOMi. Alert. Oriented x3  Psych: Normal affect, dress and demeanor. Normal speech. Normal thought content and judgment.  No exam data present No results found. No results found for this or any previous  visit (from the past 24 hour(s)).  Assessment/Plan: Anita Black is a 36 y.o. female present for OV for  Other migraine without status migrainosus, intractable - Discussed with patient today that Toradol should not be taken more than 5 days in a row, and great caution has to be used with lithium co-use secondary to possible changes in kidney function with the use of Toradol. - We'll provide injection today, along with Zofran IM, Phenergan refills for every 8 hours when necessary. Patient is to go home, dark room rest take 50 mg of Benadryl. -  If headache persists, she is to be seen in the ED immediately.  - Would not encourage anymore Toradol use. - Flexeril 5 mg daily at bedtime prescribed. - AMB referral to headache clinic - ketorolac (TORADOL) injection 60 mg; Inject 2 mLs (60 mg total) into the muscle once. - ondansetron (ZOFRAN) injection 4 mg; Inject 2 mLs (4 mg total) into the muscle once. - f/u prn  Reviewed expectations re: course of current medical issues.  Discussed self-management of symptoms.  Outlined signs and symptoms indicating need for more acute intervention.  Patient verbalized understanding and all questions were answered.  Patient received an After-Visit Summary.     Note is dictated utilizing voice recognition software. Although note has been proof read prior to signing, occasional typographical errors still can be missed. If any questions arise, please do not hesitate to call for verification.   electronically signed by:  Felix Pacini, DO  [facility] - [place]

## 2016-10-22 ENCOUNTER — Emergency Department (HOSPITAL_BASED_OUTPATIENT_CLINIC_OR_DEPARTMENT_OTHER)
Admission: EM | Admit: 2016-10-22 | Discharge: 2016-10-22 | Disposition: A | Discharge status: Home / Self Care | Payer: Managed Care, Other (non HMO) | Attending: Physician Assistant | Admitting: Physician Assistant

## 2016-10-22 ENCOUNTER — Encounter (HOSPITAL_BASED_OUTPATIENT_CLINIC_OR_DEPARTMENT_OTHER): Payer: Self-pay | Admitting: *Deleted

## 2016-10-22 DIAGNOSIS — Z791 Long term (current) use of non-steroidal anti-inflammatories (NSAID): Secondary | ICD-10-CM | POA: Diagnosis not present

## 2016-10-22 DIAGNOSIS — Z7984 Long term (current) use of oral hypoglycemic drugs: Secondary | ICD-10-CM | POA: Diagnosis not present

## 2016-10-22 DIAGNOSIS — R51 Headache: Secondary | ICD-10-CM | POA: Diagnosis present

## 2016-10-22 DIAGNOSIS — E119 Type 2 diabetes mellitus without complications: Secondary | ICD-10-CM | POA: Diagnosis not present

## 2016-10-22 DIAGNOSIS — Z79899 Other long term (current) drug therapy: Secondary | ICD-10-CM | POA: Insufficient documentation

## 2016-10-22 DIAGNOSIS — G43109 Migraine with aura, not intractable, without status migrainosus: Secondary | ICD-10-CM | POA: Insufficient documentation

## 2016-10-22 LAB — CBC WITH DIFFERENTIAL/PLATELET
BASOS PCT: 0 %
Basophils Absolute: 0 10*3/uL (ref 0.0–0.1)
EOS ABS: 0.1 10*3/uL (ref 0.0–0.7)
Eosinophils Relative: 1 %
HCT: 30.7 % — ABNORMAL LOW (ref 36.0–46.0)
Hemoglobin: 10.3 g/dL — ABNORMAL LOW (ref 12.0–15.0)
LYMPHS ABS: 1.6 10*3/uL (ref 0.7–4.0)
Lymphocytes Relative: 20 %
MCH: 27.8 pg (ref 26.0–34.0)
MCHC: 33.6 g/dL (ref 30.0–36.0)
MCV: 82.7 fL (ref 78.0–100.0)
Monocytes Absolute: 0.3 10*3/uL (ref 0.1–1.0)
Monocytes Relative: 5 %
NEUTROS ABS: 5.7 10*3/uL (ref 1.7–7.7)
NEUTROS PCT: 74 %
Platelets: 231 10*3/uL (ref 150–400)
RBC: 3.71 MIL/uL — AB (ref 3.87–5.11)
RDW: 16 % — ABNORMAL HIGH (ref 11.5–15.5)
WBC: 7.6 10*3/uL (ref 4.0–10.5)

## 2016-10-22 LAB — COMPREHENSIVE METABOLIC PANEL
ALT: 25 U/L (ref 14–54)
ANION GAP: 7 (ref 5–15)
AST: 26 U/L (ref 15–41)
Albumin: 3.3 g/dL — ABNORMAL LOW (ref 3.5–5.0)
Alkaline Phosphatase: 81 U/L (ref 38–126)
BUN: 14 mg/dL (ref 6–20)
CHLORIDE: 103 mmol/L (ref 101–111)
CO2: 26 mmol/L (ref 22–32)
CREATININE: 0.76 mg/dL (ref 0.44–1.00)
Calcium: 8.5 mg/dL — ABNORMAL LOW (ref 8.9–10.3)
Glucose, Bld: 131 mg/dL — ABNORMAL HIGH (ref 65–99)
Potassium: 3.7 mmol/L (ref 3.5–5.1)
Sodium: 136 mmol/L (ref 135–145)
Total Bilirubin: 1 mg/dL (ref 0.3–1.2)
Total Protein: 6.2 g/dL — ABNORMAL LOW (ref 6.5–8.1)

## 2016-10-22 LAB — HCG, QUANTITATIVE, PREGNANCY

## 2016-10-22 MED ORDER — DIPHENHYDRAMINE HCL 50 MG/ML IJ SOLN
25.0000 mg | Freq: Once | INTRAMUSCULAR | Status: AC
  Administered 2016-10-22: 25 mg via INTRAVENOUS
  Filled 2016-10-22: qty 1

## 2016-10-22 MED ORDER — PROCHLORPERAZINE EDISYLATE 5 MG/ML IJ SOLN
10.0000 mg | Freq: Once | INTRAMUSCULAR | Status: AC
  Administered 2016-10-22: 10 mg via INTRAVENOUS
  Filled 2016-10-22: qty 2

## 2016-10-22 MED ORDER — SODIUM CHLORIDE 0.9 % IV BOLUS (SEPSIS)
1000.0000 mL | Freq: Once | INTRAVENOUS | Status: AC
  Administered 2016-10-22: 1000 mL via INTRAVENOUS

## 2016-10-22 MED ORDER — KETOROLAC TROMETHAMINE 30 MG/ML IJ SOLN
30.0000 mg | Freq: Once | INTRAMUSCULAR | Status: AC
  Administered 2016-10-22: 30 mg via INTRAVENOUS
  Filled 2016-10-22: qty 1

## 2016-10-22 MED ORDER — PROMETHAZINE HCL 25 MG/ML IJ SOLN
12.5000 mg | Freq: Four times a day (QID) | INTRAMUSCULAR | Status: DC | PRN
  Administered 2016-10-22: 12.5 mg via INTRAVENOUS
  Filled 2016-10-22: qty 1

## 2016-10-22 MED ORDER — DEXAMETHASONE SODIUM PHOSPHATE 10 MG/ML IJ SOLN
10.0000 mg | Freq: Once | INTRAMUSCULAR | Status: AC
  Administered 2016-10-22: 10 mg via INTRAMUSCULAR
  Filled 2016-10-22: qty 1

## 2016-10-22 NOTE — ED Provider Notes (Signed)
[facility] Provider Note   CSN: [id number] Arrival date & time: 10/22/16  4098     History   Chief Complaint Chief Complaint  Patient presents with  . Migraine    HPI Anita Black is a 36 y.o. female.  HPI   Patient is a 26 old female with history of anxiety depression multiple sclerosis and migraines presenting today with a migraine. Patient has tried her at home injections of Toradol twice a day for the last 4 days. Patient with her primary care physician yesterday and received 60 mg IM of Toradol. She took another 30 mg last night. This is not helping her headache. She is here for headache relief. She has feels worse than her usual migraines but similar nature. Patient has no visual symptoms.  Past Medical History:  Diagnosis Date  . Anxiety   . Depression   . Diabetes type 2, controlled (HCC)   . Elevated hemoglobin A1c   . Hyperlipidemia   . Migraine   . Multiple sclerosis (HCC)    receives Tysabri infusion q 28 days    Patient Active Problem List   Diagnosis Date Noted  . Diarrhea 09/30/2016  . Lightheaded 09/23/2016  . Gait disturbance 09/23/2016  . Dysesthesia 09/15/2016  . Cold sore 06/18/2016  . Ectatic aorta (HCC) 02/09/2016  . Allergic urticaria 10/28/2015  . Diabetes type 2, controlled (HCC) 07/09/2015  . Hyperhidrosis of face 07/09/2015  . Encounter for preventive health examination 05/01/2015  . BMI 34.0-34.9,adult 05/01/2015  . Common migraine without intractability 11/20/2014  . Other fatigue 04/15/2014  . Depression 04/15/2014  . MS (multiple sclerosis) (HCC) 01/08/2014  . Multiple sclerosis (HCC) 12/27/2013    Past Surgical History:  Procedure Laterality Date  . ABDOMINOPLASTY    . CESAREAN SECTION    . CHOLECYSTECTOMY  2001    OB History    Gravida Para Term Preterm AB Living   1 1 1     1    SAB TAB Ectopic Multiple Live Births                   Home Medications    Prior to Admission medications     Medication Sig Start Date End Date Taking? Authorizing Provider  ALPRAZolam Prudy Feeler) 0.5 MG tablet Take 1 tablet (0.5 mg total) by mouth 3 (three) times daily as needed for anxiety or sleep. 09/18/16  Yes Rankin, Shuvon B, NP  BD SAFETYGLIDE SHIELDED NEEDLE 25G X 1" MISC USE AS DIRECTED 07/26/16  Yes Sater, Pearletha Furl, MD  Brexpiprazole (REXULTI) 1 MG TABS 1 tablet daily. 09/27/16  Yes [provider]  cyclobenzaprine (FLEXERIL) 5 MG tablet Take 1 tablet (5 mg total) by mouth 2 (two) times daily as needed for muscle spasms. 10/21/16  Yes Kuneff, Renee A, DO  gabapentin (NEURONTIN) 300 MG capsule One po qAM, one po qPM and two po qHS 09/15/16  Yes Sater, Pearletha Furl, MD  glycopyrrolate (ROBINUL) 2 MG tablet daily. 05/23/14  Yes [provider]  ketorolac (TORADOL) 30 MG/ML injection One cc IM prn migraine.  No more than 2/day Dispense with 8 (eight)    3cc syringes with 1 inch 25g needles 06/30/16  Yes Sater, Pearletha Furl, MD  lisdexamfetamine ([name]) 60 MG capsule Take 1 capsule (60 mg total) by mouth every morning. 09/15/16  Yes Sater, Pearletha Furl, MD  lithium carbonate 300 MG capsule Take 900 mg by mouth at bedtime.  09/14/16  Yes [provider]  LORYNA 3-0.02  MG tablet TAKE ONE TABLET EACH DAY 01/16/16  Yes [provider]  metFORMIN (GLUCOPHAGE) 500 MG tablet 1000 mg BID with meals. Needs appt prior to anymore refills. 06/17/16  Yes Kuneff, Renee A, DO  promethazine (PHENERGAN) 25 MG tablet Take 1 tablet (25 mg total) by mouth every 8 (eight) hours as needed for nausea or vomiting. 10/21/16  Yes Kuneff, Renee A, DO  rizatriptan (MAXALT-MLT) 10 MG disintegrating tablet Take 1 tablet (10 mg total) by mouth as needed for migraine. May repeat in 2 hours if needed 06/30/16  Yes Sater, Pearletha Furl, MD  sertraline (ZOLOFT) 100 MG tablet Take 150 mg by mouth daily.   Yes [provider]  SUMAtriptan (IMITREX) 100 MG tablet Take 100 mg by mouth every 2 (two) hours as needed for  migraine. May repeat in 2 hours if headache persists or recurs.   Yes [provider]  valACYclovir (VALTREX) 1000 MG tablet 2000 mg every 12 hours for 2 doses. 06/17/16  Yes Kuneff, Renee A, DO    Family History Family History  Problem Relation Age of Onset  . Diabetes Mother   . Hypertension Mother   . Dementia Mother   . Bipolar disorder Mother   . Heart failure Father   . Hypertension Father   . Heart disease Father   . Ovarian cancer Maternal Grandmother   . Alzheimer's disease Paternal Grandmother   . Skin cancer Maternal Grandfather     Social History Social History  Substance Use Topics  . Smoking status: Never Smoker  . Smokeless tobacco: Never Used  . Alcohol use Yes     Comment: socially; once a week     Allergies   Patient has no known allergies.   Review of Systems Review of Systems  Constitutional: Negative for activity change.  Respiratory: Negative for shortness of breath.   Cardiovascular: Negative for chest pain.  Gastrointestinal: Negative for abdominal pain.  Neurological: Positive for headaches.  All other systems reviewed and are negative.    Physical Exam Updated Vital Signs BP 113/70 (BP Location: Right Arm)   Pulse 79   Temp 98.2 F (36.8 C) (Oral)   Resp 16   Ht 6' (1.829 m)   Wt 113.9 kg (251 lb)   LMP 10/16/2016   SpO2 96%   BMI 34.04 kg/m   Physical Exam  Constitutional: She is oriented to person, place, and time. She appears well-developed and well-nourished.  HENT:  Head: Normocephalic and atraumatic.  Eyes: Conjunctivae and EOM are normal. Pupils are equal, round, and reactive to light. Right eye exhibits no discharge. Left eye exhibits no discharge.  Cardiovascular: Normal rate, regular rhythm and normal heart sounds.   No murmur heard. Pulmonary/Chest: Effort normal and breath sounds normal. She has no wheezes. She has no rales.  Abdominal: Soft. She exhibits no distension. There is no tenderness.    Neurological: She is oriented to person, place, and time.  Equal strength bilaterally upper and lower extremities negative pronator drift. Normal sensation bilaterally. Speech comprehensible, no slurring. Facial nerve tested and appears grossly normal. Alert and oriented 3.             Skin: Skin is warm and dry. She is not diaphoretic.  Psychiatric: She has a normal mood and affect.  Nursing note and vitals reviewed.    ED Treatments / Results  Labs (all labs ordered are listed, but only abnormal results are displayed) Labs Reviewed  COMPREHENSIVE METABOLIC PANEL  CBC WITH DIFFERENTIAL/PLATELET  HCG, SERUM, QUALITATIVE    EKG  EKG Interpretation None       Radiology No results found.  Procedures Procedures (including critical care time)  Medications Ordered in ED Medications  sodium chloride 0.9 % bolus 1,000 mL (not administered)  prochlorperazine (COMPAZINE) injection 10 mg (not administered)  diphenhydrAMINE (BENADRYL) injection 25 mg (not administered)     Initial Impression / Assessment and Plan / ED Course  I have reviewed the triage vital signs and the nursing notes.  Pertinent labs & imaging results that were available during my care of the patient were reviewed by me and considered in my medical decision making (see chart for details).    The patient 89 old female with history of MS and migraines. Here with migraine. We'll treat with migraine cocktail. We'll run labs, given the patient's been taking large doses of Toradol for the last 4-5 days.  He should follow up for MS and migraine by   neurologist.  1:35 PM After 2 rounds of antiemetics and Benadryl. As well as Decadron. Patient feels improved. We'll discharge home.  Final Clinical Impressions(s) / ED Diagnoses   Final diagnoses:  None    New Prescriptions New Prescriptions   No medications on file     Abelino Derrick, MD 10/22/16 1335

## 2016-10-22 NOTE — ED Triage Notes (Signed)
Pt reports migraine since this past Sunday -- states normal meds haven't worked and was seen by PCP yesterday (received Toradol shot) and was instructed to come to ED if it didn't get better. Reports associated n/v, light and sound sensitivity. Denies new symptoms that aren't typical of her migraines.

## 2016-11-16 ENCOUNTER — Encounter: Payer: Self-pay | Admitting: *Deleted

## 2016-12-25 ENCOUNTER — Emergency Department (HOSPITAL_BASED_OUTPATIENT_CLINIC_OR_DEPARTMENT_OTHER): Payer: Managed Care, Other (non HMO)

## 2016-12-25 ENCOUNTER — Emergency Department (HOSPITAL_BASED_OUTPATIENT_CLINIC_OR_DEPARTMENT_OTHER)
Admission: EM | Admit: 2016-12-25 | Discharge: 2016-12-25 | Disposition: A | Discharge status: Home / Self Care | Payer: Managed Care, Other (non HMO) | Attending: Emergency Medicine | Admitting: Emergency Medicine

## 2016-12-25 ENCOUNTER — Encounter (HOSPITAL_BASED_OUTPATIENT_CLINIC_OR_DEPARTMENT_OTHER): Payer: Self-pay | Admitting: Emergency Medicine

## 2016-12-25 DIAGNOSIS — G35 Multiple sclerosis: Secondary | ICD-10-CM | POA: Insufficient documentation

## 2016-12-25 DIAGNOSIS — E119 Type 2 diabetes mellitus without complications: Secondary | ICD-10-CM | POA: Diagnosis not present

## 2016-12-25 DIAGNOSIS — R0789 Other chest pain: Secondary | ICD-10-CM | POA: Insufficient documentation

## 2016-12-25 DIAGNOSIS — Z79899 Other long term (current) drug therapy: Secondary | ICD-10-CM | POA: Insufficient documentation

## 2016-12-25 LAB — BASIC METABOLIC PANEL
ANION GAP: 13 (ref 5–15)
BUN: 14 mg/dL (ref 6–20)
CALCIUM: 9.2 mg/dL (ref 8.9–10.3)
CO2: 22 mmol/L (ref 22–32)
Chloride: 100 mmol/L — ABNORMAL LOW (ref 101–111)
Creatinine, Ser: 0.75 mg/dL (ref 0.44–1.00)
GFR calc Af Amer: >60 mL/min (ref >60)
Glucose, Bld: 94 mg/dL (ref 65–99)
POTASSIUM: 4 mmol/L (ref 3.5–5.1)
SODIUM: 135 mmol/L (ref 135–145)

## 2016-12-25 LAB — D-DIMER, QUANTITATIVE: D-Dimer, Quant: <0.27 ug/mL-FEU (ref 0.00–0.50)

## 2016-12-25 LAB — CBC
HCT: 35.8 % — ABNORMAL LOW (ref 36.0–46.0)
HEMOGLOBIN: 12 g/dL (ref 12.0–15.0)
MCH: 27.6 pg (ref 26.0–34.0)
MCHC: 33.5 g/dL (ref 30.0–36.0)
MCV: 82.5 fL (ref 78.0–100.0)
PLATELETS: 255 10*3/uL (ref 150–400)
RBC: 4.34 MIL/uL (ref 3.87–5.11)
RDW: 14.5 % (ref 11.5–15.5)
WBC: 8.1 10*3/uL (ref 4.0–10.5)

## 2016-12-25 LAB — TROPONIN I
Troponin I: <0.03 ng/mL (ref <0.03)
Troponin I: <0.03 ng/mL (ref <0.03)

## 2016-12-25 MED ORDER — GI COCKTAIL ~~LOC~~
30.0000 mL | Freq: Once | ORAL | Status: AC
  Administered 2016-12-25: 30 mL via ORAL
  Filled 2016-12-25: qty 30

## 2016-12-25 NOTE — ED Triage Notes (Signed)
patient states that she has had a burning and "pressure" to her throat, chest and epigastric region since last night

## 2016-12-25 NOTE — Discharge Instructions (Signed)
Please call and follow up with your primary care provider for further evaluation of your condition.  Return if you have any concerns.

## 2016-12-25 NOTE — ED Provider Notes (Signed)
[facility] Provider Note   CSN: [id number] Arrival date & time: 12/25/16  1234     History   Chief Complaint No chief complaint on file.   HPI Anita Black is a 36 y.o. female.  HPI   36 year old female with history of multiple sclerosis, diabetes, anxiety, depression presenting for evaluation of chest discomfort. Patient report intermittent midsternal chest discomfort that started around 1 PM yesterday. She described the sensation as a pressure sensation, having diffuse take a deep breath, mild shortness of breath and lightheadedness. Denies been waxing waning but has been persistent for the past 5 hours. It initially start as a knot in her throat, travelled to her mid chest and now to the epigastric region. She denies any burning sensation. Denies having fever, chills, productive cough, hemoptysis, back pain, or pain to the arms or legs. She initially thought it may be due to a recent multiple sclerosis infusion that she received 5 days prior. She took Benadryl's without relief. She is a former smoker, reports family history of cardiac disease. Denies history of diabetes or hypertension. Although she never had any prior history of PE or DVT, she is currently on with control pill. She denies any recent travel, recent surgery, prolonged bed rest, urine or leg swelling or calf pain. Denies any increasing life stressor.  Past Medical History:  Diagnosis Date  . Anxiety   . Depression   . Diabetes type 2, controlled (HCC)   . Elevated hemoglobin A1c   . Hyperlipidemia   . Migraine   . Multiple sclerosis (HCC)    receives Tysabri infusion q 28 days    Patient Active Problem List   Diagnosis Date Noted  . Diarrhea 09/30/2016  . Lightheaded 09/23/2016  . Gait disturbance 09/23/2016  . Dysesthesia 09/15/2016  . Cold sore 06/18/2016  . Ectatic aorta (HCC) 02/09/2016  . Allergic urticaria 10/28/2015  . Diabetes type 2, controlled (HCC) 07/09/2015  . Hyperhidrosis of  face 07/09/2015  . Encounter for preventive health examination 05/01/2015  . BMI 34.0-34.9,adult 05/01/2015  . Common migraine without intractability 11/20/2014  . Other fatigue 04/15/2014  . Depression 04/15/2014  . MS (multiple sclerosis) (HCC) 01/08/2014  . Multiple sclerosis (HCC) 12/27/2013    Past Surgical History:  Procedure Laterality Date  . ABDOMINOPLASTY    . CESAREAN SECTION    . CHOLECYSTECTOMY  2001    OB History    Gravida Para Term Preterm AB Living   1 1 1     1    SAB TAB Ectopic Multiple Live Births                   Home Medications    Prior to Admission medications   Medication Sig Start Date End Date Taking? Authorizing Provider  ALPRAZolam Prudy Feeler) 0.5 MG tablet Take 1 tablet (0.5 mg total) by mouth 3 (three) times daily as needed for anxiety or sleep. 09/18/16   Rankin, Shuvon B, NP  BD SAFETYGLIDE SHIELDED NEEDLE 25G X 1" MISC USE AS DIRECTED 07/26/16   Sater, Pearletha Furl, MD  Brexpiprazole (REXULTI) 1 MG TABS 1 tablet daily. 09/27/16   [provider]  cyclobenzaprine (FLEXERIL) 5 MG tablet Take 1 tablet (5 mg total) by mouth 2 (two) times daily as needed for muscle spasms. 10/21/16   Kuneff, Renee A, DO  gabapentin (NEURONTIN) 300 MG capsule One po qAM, one po qPM and two po qHS 09/15/16   Sater, Pearletha Furl, MD  glycopyrrolate (ROBINUL) 2 MG  tablet daily. 05/23/14   [provider]  ketorolac (TORADOL) 30 MG/ML injection One cc IM prn migraine.  No more than 2/day Dispense with 8 (eight)    3cc syringes with 1 inch 25g needles 06/30/16   Sater, Pearletha Furl, MD  lisdexamfetamine ([name]) 60 MG capsule Take 1 capsule (60 mg total) by mouth every morning. 09/15/16   Sater, Pearletha Furl, MD  lithium carbonate 300 MG capsule Take 900 mg by mouth at bedtime.  09/14/16   [provider]  LORYNA 3-0.02 MG tablet TAKE ONE TABLET EACH DAY 01/16/16   [provider]  metFORMIN (GLUCOPHAGE) 500 MG tablet 1000 mg BID with meals. Needs appt prior to  anymore refills. 06/17/16   Kuneff, Renee A, DO  promethazine (PHENERGAN) 25 MG tablet Take 1 tablet (25 mg total) by mouth every 8 (eight) hours as needed for nausea or vomiting. 10/21/16   Kuneff, Renee A, DO  rizatriptan (MAXALT-MLT) 10 MG disintegrating tablet Take 1 tablet (10 mg total) by mouth as needed for migraine. May repeat in 2 hours if needed 06/30/16   Asa Lente, MD  sertraline (ZOLOFT) 100 MG tablet Take 150 mg by mouth daily.    [provider]  SUMAtriptan (IMITREX) 100 MG tablet Take 100 mg by mouth every 2 (two) hours as needed for migraine. May repeat in 2 hours if headache persists or recurs.    [provider]  valACYclovir (VALTREX) 1000 MG tablet 2000 mg every 12 hours for 2 doses. 06/17/16   Natalia Leatherwood, DO    Family History Family History  Problem Relation Age of Onset  . Diabetes Mother   . Hypertension Mother   . Dementia Mother   . Bipolar disorder Mother   . Heart failure Father   . Hypertension Father   . Heart disease Father   . Ovarian cancer Maternal Grandmother   . Alzheimer's disease Paternal Grandmother   . Skin cancer Maternal Grandfather     Social History Social History  Substance Use Topics  . Smoking status: Never Smoker  . Smokeless tobacco: Never Used  . Alcohol use Yes     Comment: socially; once a week     Allergies   Patient has no known allergies.   Review of Systems Review of Systems  All other systems reviewed and are negative.    Physical Exam Updated Vital Signs BP (!) 104/55 (BP Location: Left Arm)   Pulse 91   Temp 98.2 F (36.8 C) (Oral)   Ht 5\' 11"  (1.803 m)   Wt 113.9 kg (251 lb)   LMP 12/11/2016   SpO2 100%   BMI 35.01 kg/m   Physical Exam  Constitutional: She is oriented to person, place, and time. She appears well-developed and well-nourished. No distress.  Obese female resting in bed in no acute discomfort  HENT:  Head: Atraumatic.  Mouth/Throat: Oropharynx is clear and  moist.  Eyes: Conjunctivae are normal.  Neck: Normal range of motion. Neck supple. No JVD present. No tracheal deviation present.  Cardiovascular: Normal rate, regular rhythm and intact distal pulses.   Pulmonary/Chest: Effort normal and breath sounds normal. No respiratory distress. She has no wheezes. She has no rales. She exhibits no tenderness.  Abdominal: Soft. Bowel sounds are normal. She exhibits no distension. There is no tenderness.  Musculoskeletal: She exhibits no edema.  Neurological: She is alert and oriented to person, place, and time.  Skin: No rash noted.  Psychiatric: She has a normal  mood and affect.  Nursing note and vitals reviewed.    ED Treatments / Results  Labs (all labs ordered are listed, but only abnormal results are displayed) Labs Reviewed  BASIC METABOLIC PANEL - Abnormal; Notable for the following:       Result Value   Chloride 100 (*)    All other components within normal limits  CBC - Abnormal; Notable for the following:    HCT 35.8 (*)    All other components within normal limits  TROPONIN I  D-DIMER, QUANTITATIVE (NOT AT [facility])  TROPONIN I    EKG  EKG Interpretation  Date/Time:  Saturday December 25 2016 12:46:07 EDT Ventricular Rate:  87 PR Interval:    QRS Duration: 94 QT Interval:  368 QTC Calculation: 443 R Axis:   33 Text Interpretation:  Sinus rhythm Borderline T abnormalities, anterior leads No old tracing to compare Confirmed by Jacalyn Lefevre [phone]) on 12/25/2016 12:56:41 PM       Radiology Dg Chest 2 View  Result Date: 12/25/2016 CLINICAL DATA:  Chest pressure since yesterday. EXAM: CHEST  2 VIEW COMPARISON:  02/24/2015 FINDINGS: The heart size and mediastinal contours are within normal limits. Both lungs are clear. The visualized skeletal structures are unremarkable. IMPRESSION: No active cardiopulmonary disease. Electronically Signed   By: Elige Ko   On: 12/25/2016 13:42    Procedures Procedures (including critical  care time)  Medications Ordered in ED Medications  gi cocktail (Maalox,Lidocaine,Donnatal) (30 mLs Oral Given 12/25/16 1316)     Initial Impression / Assessment and Plan / ED Course  I have reviewed the triage vital signs and the nursing notes.  Pertinent labs & imaging results that were available during my care of the patient were reviewed by me and considered in my medical decision making (see chart for details).     BP (!) 104/55 (BP Location: Left Arm)   Pulse 91   Temp 98.2 F (36.8 C) (Oral)   Ht 5\' 11"  (1.803 m)   Wt 113.9 kg (251 lb)   LMP 12/11/2016   SpO2 100%   BMI 35.01 kg/m    Final Clinical Impressions(s) / ED Diagnoses   Final diagnoses:  Chest pain, atypical    New Prescriptions New Prescriptions   No medications on file   1:04 PM Patient here with pressure sensation to her midsternal region are waxing waning since yesterday. Symptoms atypical for ACS. She does complain of mild shortness of breath, and she does take birth control pill which may increase risk of potential PE. Will obtain a d-dimer, cardiac workup initiated. Doubt allergic reaction from her MS medication infusion 5 days prior.  2:22 PM HEART score of two, low risk of MACE.  D-Dimer negative, doubt PE.  Labs are reassuring.  CXR normal, EKG without concerning changes.  Will obtain delta trop and repeat EKG.    4:20 PM Repeat EKG without changes.  Neg delta trop.  Stable for discharge.  Pt will f/u with PCP for further care.  Return as needed.     Fayrene Helper, PA-C 12/25/16 1633    Jacalyn Lefevre, MD 12/26/16 [phone]

## 2016-12-28 ENCOUNTER — Ambulatory Visit: Payer: Managed Care, Other (non HMO) | Admitting: Neurology

## 2016-12-29 ENCOUNTER — Encounter: Payer: Self-pay | Admitting: Neurology

## 2017-01-14 ENCOUNTER — Encounter: Payer: Self-pay | Admitting: *Deleted

## 2017-01-14 ENCOUNTER — Telehealth: Payer: Self-pay | Admitting: *Deleted

## 2017-01-14 NOTE — Telephone Encounter (Signed)
Pt. is not authorized to receive Tysabri. Pt. enrollment# [id number]. Site auth# [id number] ([facility])/fim

## 2017-02-28 ENCOUNTER — Ambulatory Visit (INDEPENDENT_AMBULATORY_CARE_PROVIDER_SITE_OTHER): Payer: Managed Care, Other (non HMO)

## 2017-02-28 DIAGNOSIS — Z23 Encounter for immunization: Secondary | ICD-10-CM | POA: Diagnosis not present

## 2017-03-11 ENCOUNTER — Encounter: Payer: Self-pay | Admitting: Family Medicine

## 2017-03-11 ENCOUNTER — Ambulatory Visit (INDEPENDENT_AMBULATORY_CARE_PROVIDER_SITE_OTHER): Payer: Managed Care, Other (non HMO) | Admitting: Family Medicine

## 2017-03-11 VITALS — BP 119/87 | HR 90 | Temp 98.3°F | Resp 20 | Wt 239.2 lb

## 2017-03-11 DIAGNOSIS — J320 Chronic maxillary sinusitis: Secondary | ICD-10-CM

## 2017-03-11 MED ORDER — AMOXICILLIN-POT CLAVULANATE 875-125 MG PO TABS: 1.0000 | ORAL_TABLET | Freq: Two times a day (BID) | ORAL | 0 refills | Status: DC

## 2017-03-11 NOTE — Patient Instructions (Signed)
Mucinex plain. Start Augmentin today.  HYDRATE! Humidifer use.   Filtereasy.Marland KitchenMarland KitchenMarland Kitchen

## 2017-03-11 NOTE — Progress Notes (Signed)
Anita Black , 1981/03/07, 36 y.o., female MRN: [id number] Patient Care Team    Relationship Specialty Notifications Start End  Natalia Leatherwood, DO PCP - General Family Medicine  02/26/15   Mitchel Honour, DO Consulting Physician Obstetrics and Gynecology  06/18/16   Asa Lente, MD  Neurology  06/18/16     Chief Complaint  Patient presents with  . Ear Pain    runny nose ,sore throat,fatigue x 3 days     Subjective: Pt presents for an OV with complaints of sore throat, fatrigue and bilateral ear pain of 3 days duration.  Associated symptoms include mild runny nose and sweats. She denies fever, chills, nausea or vomit. 2 others in her household have also been ill.   Depression screen [facility] 2/9 08/04/2016 08/04/2016  Decreased Interest 0 0  Down, Depressed, Hopeless 0 0  PHQ - 2 Score 0 0  Altered sleeping - 0  Tired, decreased energy - 0  Change in appetite - 0  Feeling bad or failure about yourself  - 0  Trouble concentrating - 0  Moving slowly or fidgety/restless - 0  Suicidal thoughts - 0  PHQ-9 Score - 0    No Known Allergies Social History  Substance Use Topics  . Smoking status: Never Smoker  . Smokeless tobacco: Never Used  . Alcohol use Yes     Comment: socially; once a week   Past Medical History:  Diagnosis Date  . Anxiety   . Depression   . Diabetes type 2, controlled (HCC)   . Elevated hemoglobin A1c   . Hyperlipidemia   . Migraine   . Multiple sclerosis (HCC)    receives Tysabri infusion q 28 days   Past Surgical History:  Procedure Laterality Date  . ABDOMINOPLASTY    . CESAREAN SECTION    . CHOLECYSTECTOMY  2001   Family History  Problem Relation Age of Onset  . Diabetes Mother   . Hypertension Mother   . Dementia Mother   . Bipolar disorder Mother   . Heart failure Father   . Hypertension Father   . Heart disease Father   . Ovarian cancer Maternal Grandmother   . Alzheimer's disease Paternal Grandmother   . Skin cancer Maternal  Grandfather    Allergies as of 03/11/2017   No Known Allergies     Medication List       Accurate as of 03/11/17  1:52 PM. Always use your most recent med list.          almotriptan 12.5 MG tablet Commonly known as:  AXERT Take by mouth.   ALPRAZolam 0.5 MG tablet Commonly known as:  XANAX Take 1 tablet (0.5 mg total) by mouth 3 (three) times daily as needed for anxiety or sleep.   baclofen 10 MG tablet Commonly known as:  LIORESAL Take by mouth.   BD SAFETYGLIDE SHIELDED NEEDLE 25G X 1" Misc Generic drug:  NEEDLE (DISP) 25 G USE AS DIRECTED   glycopyrrolate 2 MG tablet Commonly known as:  ROBINUL daily.   ketorolac 30 MG/ML injection Commonly known as:  TORADOL One cc IM prn migraine.  No more than 2/day Dispense with 8 (eight)    3cc syringes with 1 inch 25g needles   lamoTRIgine 100 MG tablet Commonly known as:  LAMICTAL   lisdexamfetamine 60 MG capsule Commonly known as:  [name] Take 1 capsule (60 mg total) by mouth every morning.   lithium carbonate 300 MG capsule Take 900 mg  by mouth at bedtime.   LORYNA 3-0.02 MG tablet Generic drug:  drospirenone-ethinyl estradiol TAKE ONE TABLET EACH DAY   metFORMIN 500 MG tablet Commonly known as:  GLUCOPHAGE 1000 mg BID with meals. Needs appt prior to anymore refills.   ocrelizumab 300 MG/10ML injection Commonly known as:  OCREVUS Inject into the vein.   promethazine 25 MG tablet Commonly known as:  PHENERGAN Take 1 tablet (25 mg total) by mouth every 8 (eight) hours as needed for nausea or vomiting.   rizatriptan 10 MG disintegrating tablet Commonly known as:  MAXALT-MLT Take 1 tablet (10 mg total) by mouth as needed for migraine. May repeat in 2 hours if needed   sertraline 100 MG tablet Commonly known as:  ZOLOFT Take 150 mg by mouth daily.   valACYclovir 1000 MG tablet Commonly known as:  VALTREX 2000 mg every 12 hours for 2 doses.   Vitamin D3 10000 units Tabs Take by mouth.       All  past medical history, surgical history, allergies, family history, immunizations andmedications were updated in the EMR today and reviewed under the history and medication portions of their EMR.     ROS: Negative, with the exception of above mentioned in HPI   Objective:  BP 119/87 (BP Location: Left Arm, Patient Position: Lying left side, Cuff Size: Large)   Pulse 90   Temp 98.3 F (36.8 C)   Resp 20   Wt 239 lb 4 oz (108.5 kg)   SpO2 96%   BMI 33.37 kg/m  Body mass index is 33.37 kg/m. Gen: Afebrile. No acute distress. Nontoxic in appearance, well developed, well nourished.  HENT: AT. [place]. Bilateral TM visualized with bilateral ear fullness and fluid. MMM, no oral lesions. Bilateral nares with erythema, redness and drainage. Throat without erythema or exudates. No cough or hoarseness.  Eyes:Pupils Equal Round Reactive to light, Extraocular movements intact,  Conjunctiva without redness, discharge or icterus. Neck/lymp/endocrine: Supple,mild anterior cervical  lymphadenopathy CV: RRR Chest: CTAB, no wheeze or crackles.  Skin: no rashes, purpura or petechiae.  Neuro: Normal gait. PERLA. EOMi. Alert. Oriented x3    No exam data present No results found. No results found for this or any previous visit (from the past 24 hour(s)).  Assessment/Plan: Anita BaxterKelli M Black is a 36 y.o. female present for OV for  1. Maxillary sinusitis, unspecified chronicity Rest, hydrate.  +/- flonase, mucinex (DM if cough), nettie pot or nasal saline.  augmentin prescribed, take until completed.  Humidifer, routine filter changes If cough present it can last up to 6-8 weeks.  F/U 2 weeks of not improved.     Reviewed expectations re: course of current medical issues.  Discussed self-management of symptoms.  Outlined signs and symptoms indicating need for more acute intervention.  Patient verbalized understanding and all questions were answered.  Patient received an After-Visit  Summary.    No orders of the defined types were placed in this encounter.    Note is dictated utilizing voice recognition software. Although note has been proof read prior to signing, occasional typographical errors still can be missed. If any questions arise, please do not hesitate to call for verification.   electronically signed by:  Felix Pacinienee Kuneff, DO  [facility] [place]

## 2017-03-15 ENCOUNTER — Encounter: Payer: Self-pay | Admitting: Family Medicine

## 2017-03-16 ENCOUNTER — Telehealth: Payer: Self-pay | Admitting: Family Medicine

## 2017-03-16 ENCOUNTER — Encounter: Payer: Self-pay | Admitting: *Deleted

## 2017-03-16 MED ORDER — FLUCONAZOLE 150 MG PO TABS: 150.0000 mg | ORAL_TABLET | Freq: Once | ORAL | 0 refills | Status: AC

## 2017-03-16 NOTE — Telephone Encounter (Signed)
Diflucan prescribed please make patient aware

## 2017-03-25 ENCOUNTER — Encounter: Payer: Self-pay | Admitting: Family Medicine

## 2017-03-25 ENCOUNTER — Ambulatory Visit (INDEPENDENT_AMBULATORY_CARE_PROVIDER_SITE_OTHER): Payer: Managed Care, Other (non HMO) | Admitting: Family Medicine

## 2017-03-25 ENCOUNTER — Other Ambulatory Visit: Payer: Self-pay | Admitting: *Deleted

## 2017-03-25 VITALS — BP 113/78 | HR 88 | Temp 97.9°F | Resp 20 | Wt 236.8 lb

## 2017-03-25 DIAGNOSIS — J40 Bronchitis, not specified as acute or chronic: Secondary | ICD-10-CM

## 2017-03-25 MED ORDER — METFORMIN HCL 500 MG PO TABS: ORAL_TABLET | ORAL | 0 refills | Status: DC

## 2017-03-25 MED ORDER — HYDROCODONE-HOMATROPINE 5-1.5 MG/5ML PO SYRP: 5.0000 mL | ORAL_SOLUTION | Freq: Three times a day (TID) | ORAL | 0 refills | Status: DC | PRN

## 2017-03-25 MED ORDER — FLUCONAZOLE 150 MG PO TABS: 150.0000 mg | ORAL_TABLET | Freq: Once | ORAL | 0 refills | Status: AC

## 2017-03-25 MED ORDER — AZITHROMYCIN 250 MG PO TABS: ORAL_TABLET | ORAL | 0 refills | Status: DC

## 2017-03-25 NOTE — Progress Notes (Signed)
Anita BaxterKelli M Black , 01/26/1981, 36 y.o., female MRN: [id number] Patient Care Team    Relationship Specialty Notifications Start End  Anita Black, Anita Vigen A, DO PCP - General Family Medicine  02/26/15   Anita HonourMorris, [name], DO Consulting Physician Obstetrics and Gynecology  06/18/16   Anita LenteSater, [name], MD  Neurology  06/18/16     Chief Complaint  Patient presents with  . Cough    hoarseness,drainage x 3 days     Subjective: Patient presents to the office with repeat complaints of nasal drainage, fatigue, sinus pressure, congestion and cough. She has been taking Mucinex. She has just finished an antibiotic 1 prior to the repeat onset of her symptoms. SHe reports she is coughing so much she had urinary incontinence. She is frustrated and tired of feeling bad. She has been started on Black new medication for her multiple sclerosis through her neurologist, which side effect profile is rather significant for respiratory infections and cough.  [facility] 08[facility]04/2016 08/04/2016  Decreased Interest 0 0  Down, Depressed, Hopeless 0 0  PHQ - 2 Score 0 0  Altered sleeping - 0  Tired, decreased energy - 0  Change in appetite - 0  Feeling bad or failure about yourself  - 0  Trouble concentrating - 0  Moving slowly or fidgety/restless - 0  Suicidal thoughts - 0  PHQ-9 Score - 0    No Known Allergies Social History   Tobacco Use  . Smoking status: Never Smoker  . Smokeless tobacco: Never Used  Substance Use Topics  . Alcohol use: Yes    Comment: socially; once Black week   Past Medical History:  Diagnosis Date  . Anxiety   . Depression   . Diabetes type 2, controlled (HCC)   . Elevated hemoglobin A1c   . Hyperlipidemia   . Migraine   . Multiple sclerosis (HCC)    receives Tysabri infusion q 28 days   Past Surgical History:  Procedure Laterality Date  . ABDOMINOPLASTY    . CESAREAN SECTION    . CHOLECYSTECTOMY  2001   Family History  Problem Relation Age of Onset  . Diabetes Mother     . Hypertension Mother   . Dementia Mother   . Bipolar disorder Mother   . Heart failure Father   . Hypertension Father   . Heart disease Father   . Ovarian cancer Maternal Grandmother   . Alzheimer's disease Paternal Grandmother   . Skin cancer Maternal Grandfather    Allergies as of 03/25/2017   No Known Allergies     Medication List        Accurate as of 03/25/17  3:55 PM. Always use your most recent med list.          almotriptan 12.5 MG tablet Commonly known as:  AXERT Take by mouth.   ALPRAZolam 0.5 MG tablet Commonly known as:  XANAX Take 1 tablet (0.5 mg total) by mouth 3 (three) times daily as needed for anxiety or sleep.   baclofen 10 MG tablet Commonly known as:  LIORESAL Take by mouth.   BD SAFETYGLIDE SHIELDED NEEDLE 25G X 1" Misc Generic drug:  NEEDLE (DISP) 25 G USE AS DIRECTED   Galcanezumab-gnlm 120 MG/ML Soaj Inject into the skin.   glycopyrrolate 2 MG tablet Commonly known as:  ROBINUL daily.   ketorolac 30 MG/ML injection Commonly known as:  TORADOL One cc IM prn migraine.  No more than 2/day Dispense with 8 (eight)  3cc syringes with 1 inch 25g needles   lamoTRIgine 100 MG tablet Commonly known as:  LAMICTAL   lisdexamfetamine 60 MG capsule Commonly known as:  [name] Take 1 capsule (60 mg total) by mouth every morning.   lithium carbonate 300 MG capsule Take 900 mg by mouth at bedtime.   LORYNA 3-0.02 MG tablet Generic drug:  drospirenone-ethinyl estradiol TAKE ONE TABLET EACH DAY   metFORMIN 500 MG tablet Commonly known as:  GLUCOPHAGE 1000 mg BID with meals. Needs appt prior to anymore refills.   ocrelizumab 300 MG/10ML injection Commonly known as:  OCREVUS Inject into the vein.   promethazine 25 MG tablet Commonly known as:  PHENERGAN Take 1 tablet (25 mg total) by mouth every 8 (eight) hours as needed for nausea or vomiting.   rizatriptan 10 MG disintegrating tablet Commonly known as:  MAXALT-MLT Take 1 tablet  (10 mg total) by mouth as needed for migraine. May repeat in 2 hours if needed   sertraline 100 MG tablet Commonly known as:  ZOLOFT Take 150 mg by mouth daily.   valACYclovir 1000 MG tablet Commonly known as:  VALTREX 2000 mg every 12 hours for 2 doses.   Vitamin D3 10000 units Tabs Take by mouth.       All past medical history, surgical history, allergies, family history, immunizations andmedications were updated in the EMR today and reviewed under the history and medication portions of their EMR.     ROS: Negative, with the exception of above mentioned in HPI   Objective:  BP 113/78 (BP Location: Right Arm, Patient Position: Sitting, Cuff Size: Large)   Pulse 88   Temp 97.9 F (36.6 C)   Resp 20   Wt 236 lb 12 oz (107.4 kg)   SpO2 100%   BMI 33.02 kg/m  Body mass index is 33.02 kg/m. Gen: Afebrile. No acute distress. Nontoxic in appearance, well-developed, well-nourished, obese Caucasian female. Appears very fatigued. HENT: AT. [place]. Bilateral TM visualized without erythema, bilateral fullness and air-fluid levels present.  MMM. Bilateral nares erythema and drainage. Throat without erythema or exudates. Mild cough. Mild hoarseness. Eyes:Pupils Equal Round Reactive to light, Extraocular movements intact,  Conjunctiva without redness, discharge or icterus. Neck/lymp/endocrine: Supple, no lymphadenopathy CV: RRR  Chest: CTAB, no wheeze or crackles Skin: no rashes, purpura or petechiae.  Neuro:  Normal gait. PERLA. EOMi. Alert. Oriented x3   No exam data present No results found. No results found for this or any previous visit (from the past 24 hour(s)).  Assessment/Plan: Anita Black is Black 36 y.o. female present for OV for  Bronchitis Continued presence of cough, with more bronchitis type features today. Does have mild sinus infection appearance as well.  Will treat with azithromycin. Patient will need to discuss with her neurologist if she continues to have  respiratory infections the multiple sclerosis medication might not be ideal for her. Rest, hydrate. Mucinex. Hycodan cough syrup-patient aware no refills-controlled substance. Follow-up when necessary   Reviewed expectations re: course of current medical issues.  Discussed self-management of symptoms.  Outlined signs and symptoms indicating need for more acute intervention.  Patient verbalized understanding and all questions were answered.  Patient received an After-Visit Summary.    No orders of the defined types were placed in this encounter.    Note is dictated utilizing voice recognition software. Although note has been proof read prior to signing, occasional typographical errors still can be missed. If any questions arise, please do not hesitate to call  for verification.   electronically signed by:  Howard Pouch, DO  [place]

## 2017-03-25 NOTE — Patient Instructions (Signed)
Rest, hydrate.  + flonase, mucinex (DM if cough), nettie pot or nasal saline.  Hycodan cough syrup--> watch for sedation. No refills.  z-pack prescribed, take until completed.  If cough present it can last up to 6-8 weeks.  F/U 2 weeks of not improved.     Start probiotics daily. Align is a good once.  Start a daily vitamin. Vit C and zinc.

## 2017-03-30 ENCOUNTER — Encounter: Payer: Self-pay | Admitting: Family Medicine

## 2017-03-30 MED ORDER — BENZONATATE 200 MG PO CAPS: 200.0000 mg | ORAL_CAPSULE | Freq: Three times a day (TID) | ORAL | 0 refills | Status: DC | PRN

## 2017-03-30 NOTE — Telephone Encounter (Signed)
I eRx'd tessalon perles per her request. She can try adding over the counter, generic delsym at bedtime. F/u early next week if not feeling improvement in symptoms.-thx

## 2017-04-27 ENCOUNTER — Encounter: Payer: Self-pay | Admitting: Family Medicine

## 2017-05-24 ENCOUNTER — Other Ambulatory Visit: Payer: Self-pay | Admitting: *Deleted

## 2017-05-24 ENCOUNTER — Ambulatory Visit: Payer: Managed Care, Other (non HMO) | Admitting: Family Medicine

## 2017-05-24 DIAGNOSIS — Z0289 Encounter for other administrative examinations: Secondary | ICD-10-CM

## 2017-05-24 MED ORDER — METFORMIN HCL 500 MG PO TABS: ORAL_TABLET | ORAL | 0 refills | Status: DC

## 2017-06-27 ENCOUNTER — Encounter: Payer: Self-pay | Admitting: *Deleted

## 2017-06-27 ENCOUNTER — Other Ambulatory Visit: Payer: Self-pay | Admitting: *Deleted

## 2017-06-27 MED ORDER — METFORMIN HCL 500 MG PO TABS: ORAL_TABLET | ORAL | 0 refills | Status: DC

## 2017-07-12 ENCOUNTER — Telehealth: Payer: Self-pay | Admitting: Neurology

## 2017-07-12 NOTE — Telephone Encounter (Signed)
Anita Black with [employer] called requesting the 6 month discontinuation questioner for Tysabri be completed, any questions contact at [phone] ext [phone]

## 2017-07-12 NOTE — Telephone Encounter (Signed)
Spoke with Rose with [employer]'s [employer] Dept. and requested d/c form be faxed to me at [phone]

## 2017-08-31 IMAGING — US US ABDOMEN COMPLETE
1 series · 14 of 25 positions shown · non-contrast
Comparison: None.

CLINICAL DATA: Nausea. Cholecystectomy. Right upper quadrant pain
for 2 months.

EXAM:
ABDOMEN ULTRASOUND COMPLETE

[Series 1: us abdomen complete · 0.25mm/px · 14 of 73 slices shown]
[im 1/73]
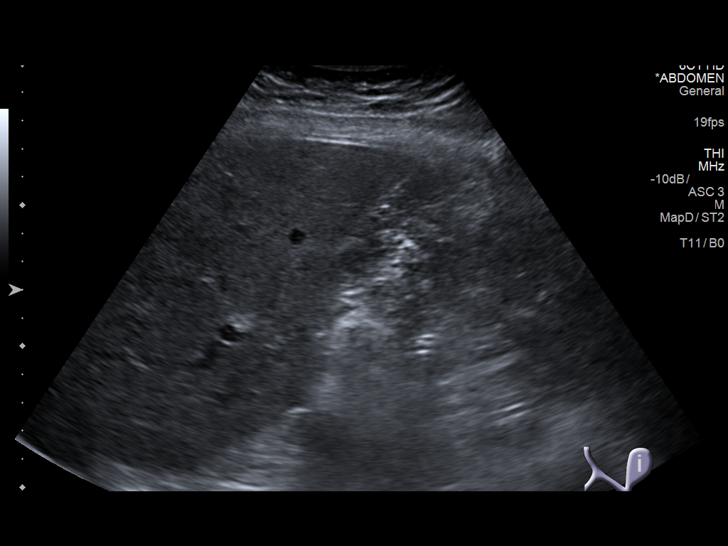
[im 7/73]
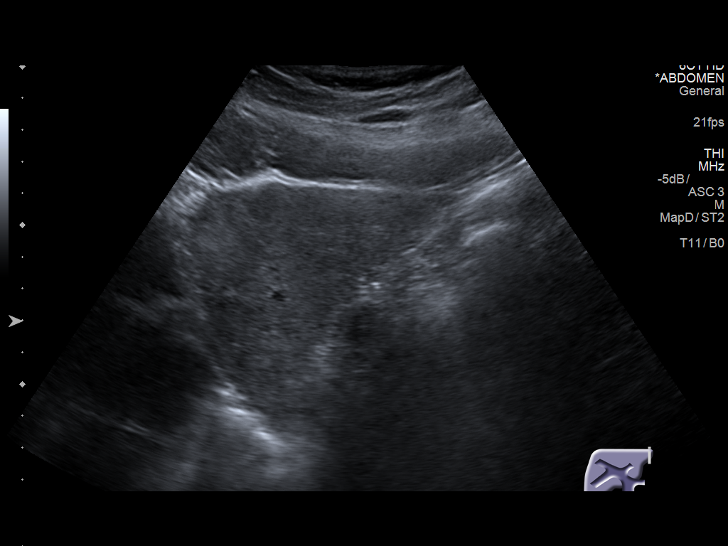
[im 13/73]
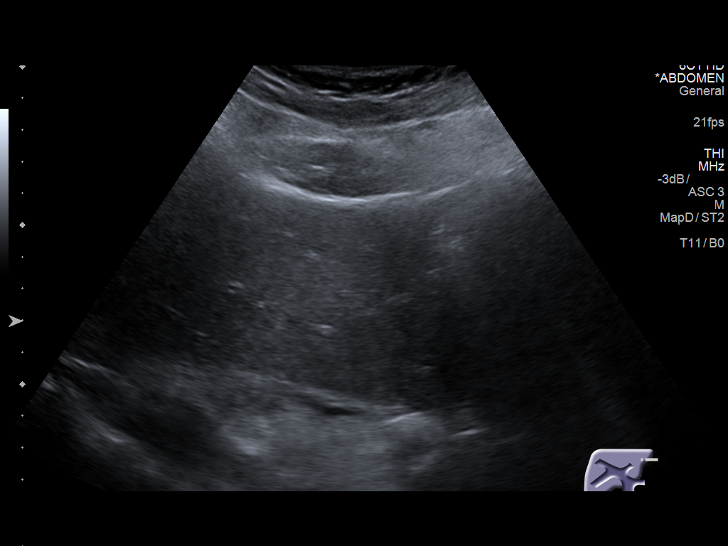
[im 19/73]
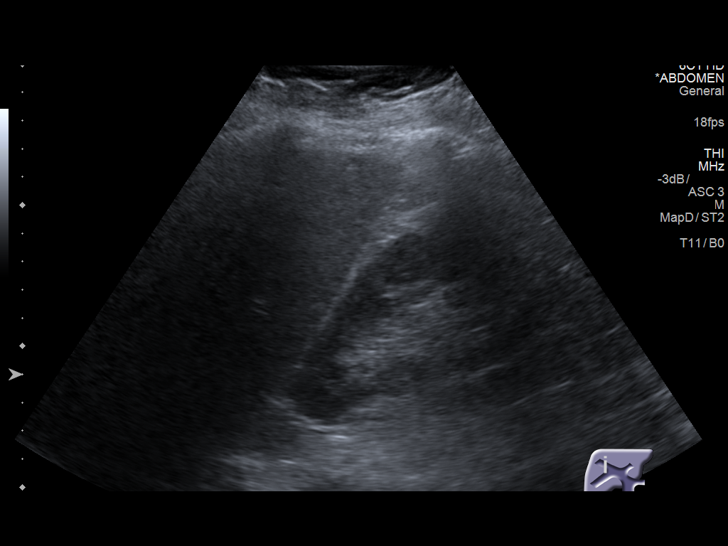
[im 25/73]
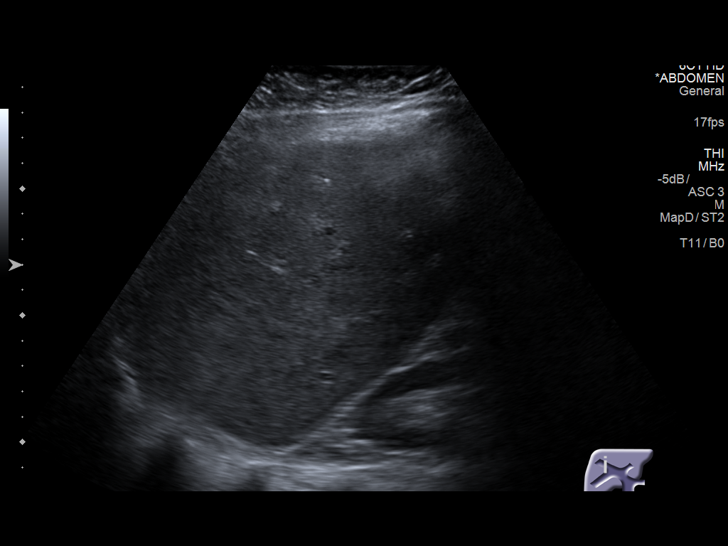
[im 28/73]
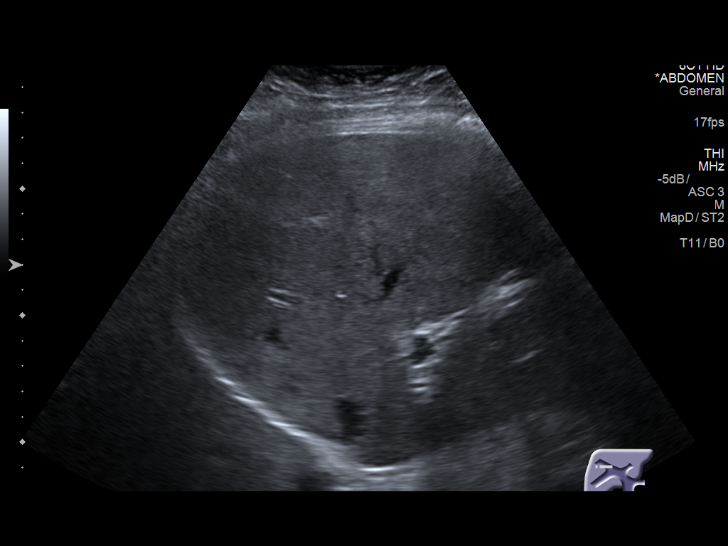
[im 34/73]
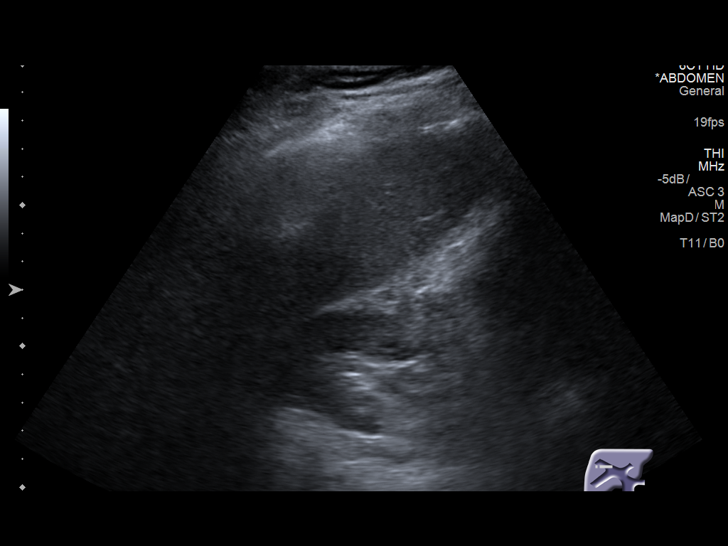
[im 40/73]
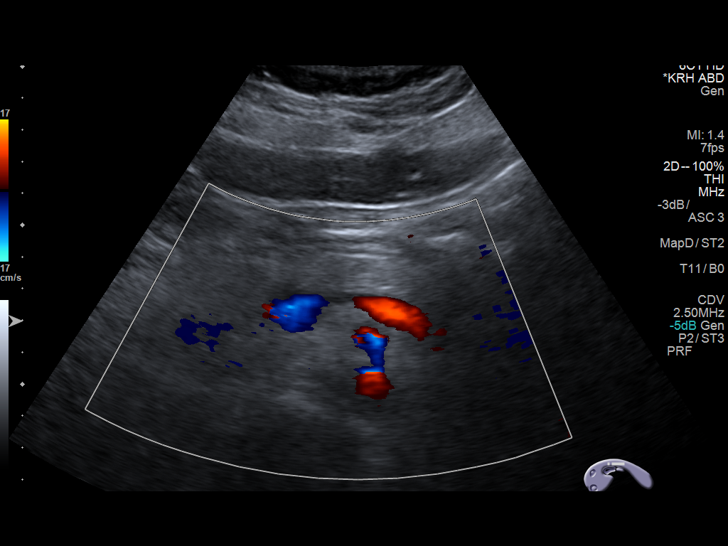
[im 46/73]
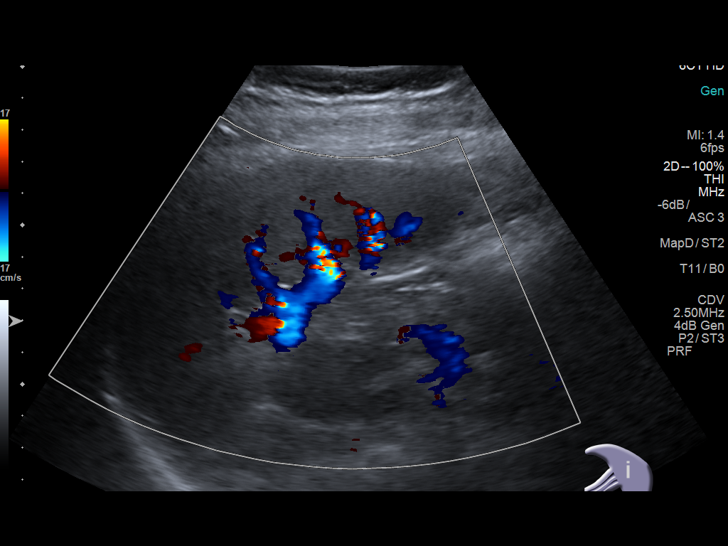
[im 49/73]
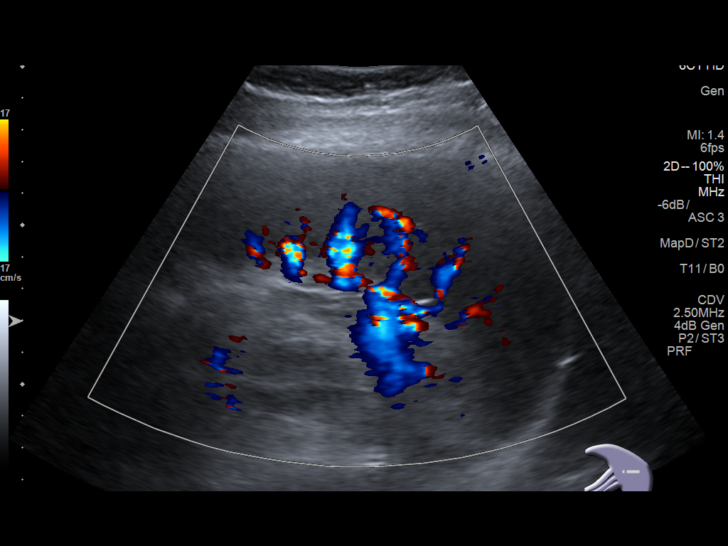
[im 55/73]
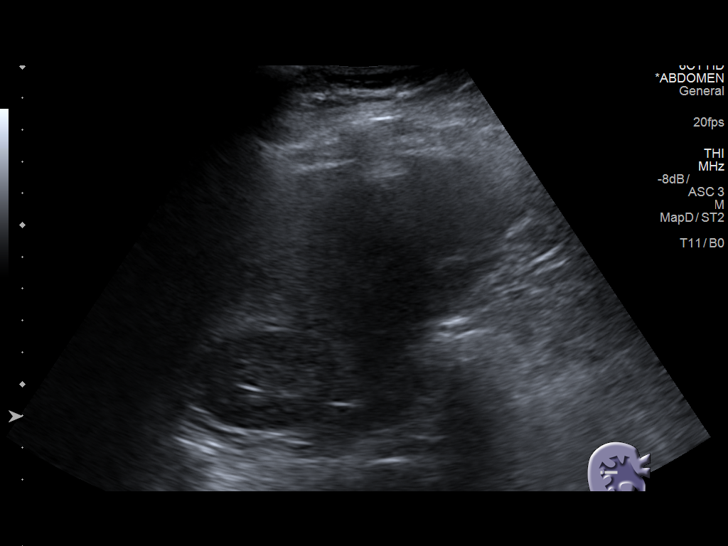
[im 61/73]
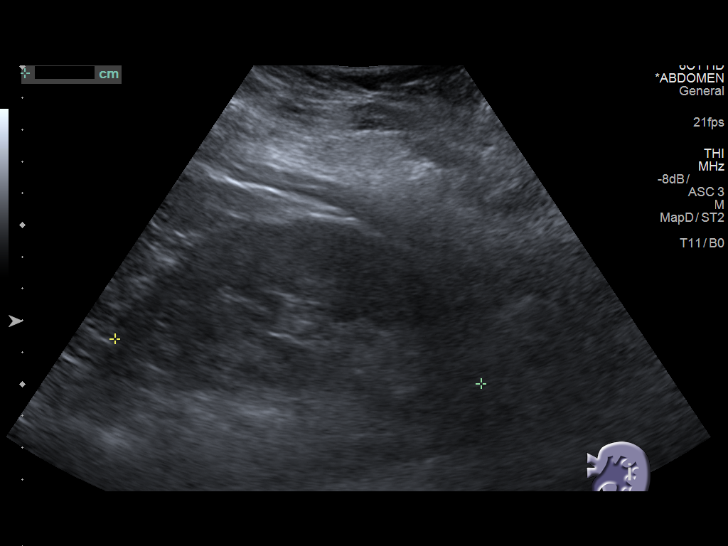
[im 67/73]
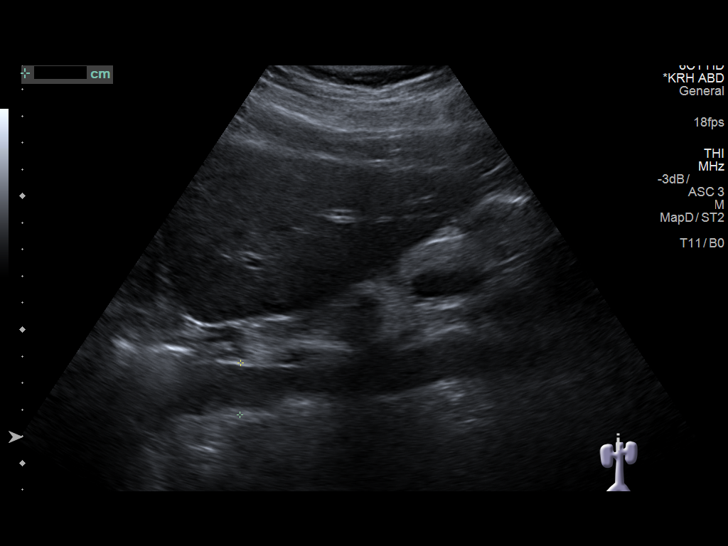
[im 73/73]
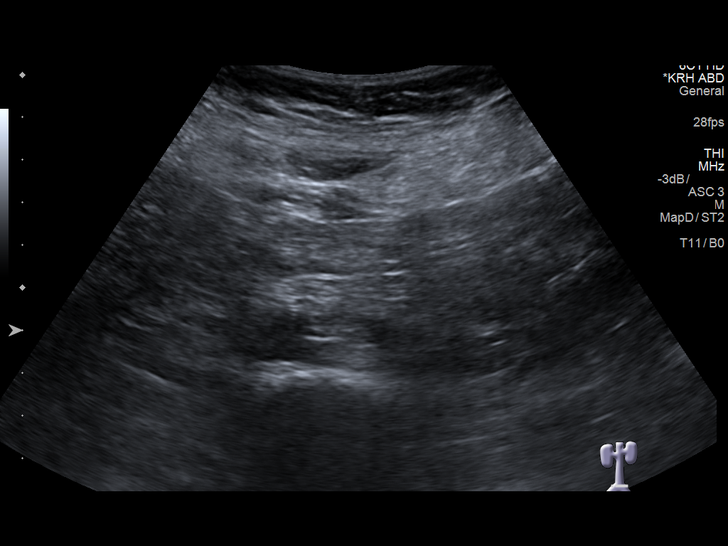

[14 of 25 positions shown; findings below may reference images not displayed]

FINDINGS: Gallbladder: Absent.

Common bile duct: Diameter: 3 mm.

Liver: Diffusely increased in echogenicity.  No focal mass.

IVC: No abnormality visualized.

Pancreas: Visualized portion unremarkable.

Spleen: Size and appearance within normal limits.

Right Kidney: Length: 10.9 cm. Echogenicity within normal limits. No
mass or hydronephrosis visualized.

Left Kidney: Length: 11.6 cm. Echogenicity within normal limits. No
mass or hydronephrosis visualized.

Abdominal aorta: Maximal diameter is 2.5 cm.

Other findings: None.
IMPRESSION: Diffuse hepatic steatosis.

Postcholecystectomy.

Ectatic aorta. Ectatic abdominal aorta at risk for aneurysm
development. Recommend followup by ultrasound in 5 years. This
recommendation follows ACR consensus guidelines: White Paper of the
ACR Incidental Findings Committee II on Vascular Findings. [HOSPITAL] 2224; [DATE].

## 2018-02-07 ENCOUNTER — Other Ambulatory Visit: Payer: Self-pay

## 2018-02-07 ENCOUNTER — Telehealth: Payer: Self-pay | Admitting: Psychiatry

## 2018-02-07 NOTE — Telephone Encounter (Signed)
equetro is making her so sick.  Can she reduce dose by half until her appt.10/15?

## 2018-02-08 MED ORDER — ONDANSETRON HCL 8 MG PO TABS: 8.0000 mg | ORAL_TABLET | Freq: Two times a day (BID) | ORAL | 0 refills | Status: DC

## 2018-02-08 NOTE — Telephone Encounter (Signed)
Spoke with patient and provider does not want to lower or adjust dose. Recommends she takes Zofran for her nausea until her ov back to see provider. Pt verbalized understanding. Called in rx to [facility].

## 2018-02-09 ENCOUNTER — Other Ambulatory Visit: Payer: Self-pay

## 2018-02-09 MED ORDER — CARBAMAZEPINE ER 300 MG PO CP12: ORAL_CAPSULE | ORAL | 0 refills | Status: DC

## 2018-02-10 ENCOUNTER — Other Ambulatory Visit: Payer: Self-pay

## 2018-02-10 MED ORDER — FLUVOXAMINE MALEATE 100 MG PO TABS: 100.0000 mg | ORAL_TABLET | Freq: Every day | ORAL | 0 refills | Status: DC

## 2018-02-10 MED ORDER — LITHIUM CARBONATE 300 MG PO CAPS: 900.0000 mg | ORAL_CAPSULE | Freq: Every day | ORAL | 0 refills | Status: DC

## 2018-02-11 ENCOUNTER — Other Ambulatory Visit: Payer: Self-pay

## 2018-02-18 ENCOUNTER — Other Ambulatory Visit: Payer: Self-pay | Admitting: Psychiatry

## 2018-02-21 ENCOUNTER — Ambulatory Visit (INDEPENDENT_AMBULATORY_CARE_PROVIDER_SITE_OTHER): Payer: 59 | Admitting: Psychiatry

## 2018-02-21 ENCOUNTER — Encounter: Payer: Self-pay | Admitting: Psychiatry

## 2018-02-21 DIAGNOSIS — F3181 Bipolar II disorder: Secondary | ICD-10-CM

## 2018-02-21 DIAGNOSIS — F411 Generalized anxiety disorder: Secondary | ICD-10-CM | POA: Diagnosis not present

## 2018-02-21 DIAGNOSIS — R112 Nausea with vomiting, unspecified: Secondary | ICD-10-CM | POA: Diagnosis not present

## 2018-02-21 DIAGNOSIS — F902 Attention-deficit hyperactivity disorder, combined type: Secondary | ICD-10-CM

## 2018-02-21 DIAGNOSIS — F4001 Agoraphobia with panic disorder: Secondary | ICD-10-CM

## 2018-02-21 MED ORDER — LAMOTRIGINE 200 MG PO TABS: ORAL_TABLET | ORAL | 1 refills | Status: DC

## 2018-02-21 MED ORDER — ONDANSETRON HCL 8 MG PO TABS: 8.0000 mg | ORAL_TABLET | Freq: Two times a day (BID) | ORAL | 2 refills | Status: DC

## 2018-02-21 MED ORDER — CARBAMAZEPINE ER 300 MG PO CP12: ORAL_CAPSULE | ORAL | 1 refills | Status: DC

## 2018-02-21 MED ORDER — LITHIUM CARBONATE 300 MG PO CAPS: 900.0000 mg | ORAL_CAPSULE | Freq: Every day | ORAL | 0 refills | Status: DC

## 2018-02-21 MED ORDER — LURASIDONE HCL 120 MG PO TABS: 120.0000 mg | ORAL_TABLET | Freq: Every day | ORAL | 0 refills | Status: DC

## 2018-02-21 MED ORDER — RISPERIDONE 1 MG PO TABS: 1.0000 mg | ORAL_TABLET | Freq: Two times a day (BID) | ORAL | 1 refills | Status: DC

## 2018-02-21 NOTE — Progress Notes (Signed)
[facility]  Patient ID: Anita Black,  MRN: [id number]  PCP: Joycelyn Rua, MD  Date of Evaluation: 02/21/2018 Time spent:30 minutes   HISTORY/CURRENT STATUS: HPI CC N from [employer].  From afternoon dose and HS, better with Zofran. N controlled with Zofran.  Benefit from [facility] with mood.  No mood swings.  Dep OK. Concerns about DDI Zofran and BCP Pooped bed twice with temazepam 30mg  and stopped.  Sleep fair and inconsistent, EMA.  Last night 4 hours, avg 5-6 hrs.  Normal need is 8-10 hrs.  Drowsy afternoon  No panic but general anxiety pretty bad.  Scared to go places, dread.  Individual Medical History/ Review of Systems: Changes? :Yes MS.    Allergies: Patient has no known allergies.  Current Medications:  Current Outpatient Medications:  .  atorvastatin (LIPITOR) 20 MG tablet, Take 20 mg by mouth daily., Disp: , Rfl:  .  Carbamazepine (EQUETRO) 300 MG CP12, Take two capsules twice a day and take two capsules at bedtime., Disp: 540 capsule, Rfl: 0 .  Cholecalciferol (VITAMIN D3) 10000 units TABS, Take by mouth., Disp: , Rfl:  .  lamoTRIgine (LAMICTAL) 200 MG tablet, TAKE 2 TABLETS BY MOUTH EVERY DAY (Patient taking differently: Take 200 mg by mouth every morning. 100mg  at night), Disp: 60 tablet, Rfl: 0 .  lisdexamfetamine ([name]) 60 MG capsule, Take 1 capsule (60 mg total) by mouth every morning., Disp: 30 capsule, Rfl: 0 .  lithium carbonate 300 MG capsule, Take 3 capsules (900 mg total) by mouth at bedtime., Disp: 270 capsule, Rfl: 0 .  Lurasidone HCl ([name]) 120 MG TABS, Take 120 mg by mouth daily., Disp: , Rfl:  .  metFORMIN (GLUCOPHAGE) 500 MG tablet, 1000 mg BID with meals. Needs appt prior to anymore refills., Disp: 60 tablet, Rfl: 0 .  ondansetron (ZOFRAN) 8 MG tablet, Take 1 tablet (8 mg total) by mouth 2 (two) times daily., Disp: 60 tablet, Rfl: 0 .  promethazine (PHENERGAN) 25 MG tablet, Take 1 tablet (25 mg total) by mouth every 8 (eight) hours  as needed for nausea or vomiting., Disp: 20 tablet, Rfl: 0 .  rizatriptan (MAXALT-MLT) 10 MG disintegrating tablet, Take 1 tablet (10 mg total) by mouth as needed for migraine. May repeat in 2 hours if needed, Disp: 9 tablet, Rfl: 11 .  almotriptan (AXERT) 12.5 MG tablet, Take by mouth., Disp: , Rfl:  .  azithromycin (ZITHROMAX) 250 MG tablet, 500 mg day 1, then 250 mg. (Patient not taking: Reported on 02/21/2018), Disp: 6 tablet, Rfl: 0 .  BD SAFETYGLIDE SHIELDED NEEDLE 25G X 1" MISC, USE AS DIRECTED, Disp: 10 each, Rfl: 7 .  benzonatate (TESSALON) 200 MG capsule, Take 1 capsule (200 mg total) by mouth 3 (three) times daily as needed for cough. (Patient not taking: Reported on 02/21/2018), Disp: 30 capsule, Rfl: 0 .  Galcanezumab-gnlm 120 MG/ML SOAJ, Inject into the skin., Disp: , Rfl:  .  glycopyrrolate (ROBINUL) 2 MG tablet, daily., Disp: , Rfl:  .  ketorolac (TORADOL) 30 MG/ML injection, One cc IM prn migraine.  No more than 2/day Dispense with 8 (eight)    3cc syringes with 1 inch 25g needles (Patient not taking: Reported on 02/21/2018), Disp: 8 mL, Rfl: 11 .  LORYNA 3-0.02 MG tablet, TAKE ONE TABLET EACH DAY, Disp: , Rfl: 3 .  ocrelizumab (OCREVUS) 300 MG/10ML injection, Inject into the vein., Disp: , Rfl:  .  valACYclovir (VALTREX) 1000 MG tablet, 2000 mg every 12 hours for  2 doses. (Patient not taking: Reported on 02/21/2018), Disp: 8 tablet, Rfl: 3 Medication Side Effects: Nausea severe from CBZ managed with Zofran  Family Medical/ Social History: Changes? Yes applied for disability for psyc reasons  MENTAL HEALTH EXAM:  There were no vitals taken for this visit.There is no height or weight on file to calculate BMI.  General Appearance: Casual  Eye Contact:  Good  Speech:  Normal Rate  Volume:  Normal  Mood:  Anxious severe  Affect:  Tearful  Thought Process:  Coherent and Goal Directed  Orientation:  Full (Time, Place, and Person)  Thought Content: WDL   Suicidal Thoughts:  No   Homicidal Thoughts:  No  Memory:  Recent  Judgement:  Good  Insight:  Fair  Psychomotor Activity:  Normal  Concentration:  Concentration: Good  Recall:  Good  Fund of Knowledge: Good  Language: Good  Akathisia:  No  AIMS (if indicated): not done  Assets:  Communication Skills Desire for Improvement Housing Intimacy Social Support Transportation  ADL's:  Intact  Cognition: WNL  Prognosis:  Good    DIAGNOSES:    ICD-10-CM   1. Nausea and vomiting, intractability of vomiting not specified, unspecified vomiting type R11.2   2. Bipolar II disorder (HCC) F31.81   3. Generalized anxiety disorder F41.1   4. Panic disorder with agoraphobia F40.01   5. Attention deficit hyperactivity disorder (ADHD), combined type F90.2     RECOMMENDATIONS:  "I just don't want to be crazy again" Greater than 50% of face to face time with patient was spent on counseling and coordination of care. We discussed her chronically unstable bipolar disorder with a history of abrupt worsening of depression with suicidal ideation.  He is getting good response from the current medications but nausea from the carbamazepine.  Currently that is being managed with Zofran which is medically necessary at this time. Unfortunately her anxiety is not managed and it is causing severe avoidance and mild paranoid thoughts of doom and dread.  We discussed that she is at maximum tolerated current dosages and would need an alternative medication if we were to try to manage this.  Drug interactions CBZ with especially BCP.  She'll discuss with gyn next week.  Avoid pregnancy on meds.  Risperidone and trial in hopes of replacing Zofran and controlling TRAnxiety better adjusted between 1-2 mg/D.  Disc SE.  Risperidone has a better antianxiety effect then Zofran and also has an antinausea effect.  Hopefully we can accomplish better anxiety control and still keep the nausea under control.  If the risperidone accomplishes this she will  discontinue the Zofran.  Call if any any worsening of symptoms.  We discussed side effects including EPS type side effects.  Because of her instability I will see her in again in a month.    Lauraine Rinne, MD

## 2018-02-22 ENCOUNTER — Telehealth: Payer: Self-pay | Admitting: Psychiatry

## 2018-02-22 NOTE — Telephone Encounter (Signed)
Patient states she picked up fluvoxamine by accident.  It was discontinued and the med list I am not sure why it showed up at her pharmacy.  Call the pharmacy and canceled it.

## 2018-03-12 ENCOUNTER — Encounter: Payer: Self-pay | Admitting: Emergency Medicine

## 2018-03-12 DIAGNOSIS — F410 Panic disorder [episodic paroxysmal anxiety] without agoraphobia: Secondary | ICD-10-CM | POA: Insufficient documentation

## 2018-03-12 DIAGNOSIS — F5104 Psychophysiologic insomnia: Secondary | ICD-10-CM | POA: Insufficient documentation

## 2018-03-14 ENCOUNTER — Other Ambulatory Visit: Payer: Self-pay | Admitting: Psychiatry

## 2018-03-19 ENCOUNTER — Other Ambulatory Visit: Payer: Self-pay | Admitting: Psychiatry

## 2018-03-20 ENCOUNTER — Other Ambulatory Visit: Payer: Self-pay | Admitting: Psychiatry

## 2018-03-30 ENCOUNTER — Ambulatory Visit (INDEPENDENT_AMBULATORY_CARE_PROVIDER_SITE_OTHER): Payer: 59 | Admitting: Psychiatry

## 2018-03-30 ENCOUNTER — Encounter: Payer: Self-pay | Admitting: Psychiatry

## 2018-03-30 DIAGNOSIS — F5105 Insomnia due to other mental disorder: Secondary | ICD-10-CM | POA: Diagnosis not present

## 2018-03-30 DIAGNOSIS — F411 Generalized anxiety disorder: Secondary | ICD-10-CM

## 2018-03-30 DIAGNOSIS — F4001 Agoraphobia with panic disorder: Secondary | ICD-10-CM

## 2018-03-30 DIAGNOSIS — F3181 Bipolar II disorder: Secondary | ICD-10-CM

## 2018-03-30 MED ORDER — OXCARBAZEPINE 300 MG PO TABS: ORAL_TABLET | ORAL | 1 refills | Status: DC

## 2018-03-30 NOTE — Progress Notes (Signed)
Anita Black [id number] 1980-08-21 37 y.o.  Subjective:   Patient ID:  Anita Black is a 37 y.o. (DOB 1981/02/23) female.  Chief Complaint:  Chief Complaint  Patient presents with  . Anxiety  . Medication Problem    nausea with [facility] [place]  . Sleeping Problem    HPI Anita Black presents to the office today for follow-up of bipolar disorder and anxiety and her decision that she is intolerant of the Equetro and cannot continue it. Concerned about Equetro and interaction of BCP and interaction with MS med and BCP not working leads to acne, but most concern is GI pain all the time. Risperidone did not like it, too laid back.  Took it about 5 times.  Didn't help the nausea.  Patient reports stable mood and denies depressed or irritable moods.  Patient continues with recent difficulty with anxiety.  Patient denies difficulty with sleep initiation or maintenance. Denies appetite disturbance.  Patient reports that energy and motivation have been good.  Patient denies any difficulty with concentration.  Patient denies any suicidal ideation.   Review of Systems:  Review of Systems  Gastrointestinal: Positive for abdominal pain, nausea and vomiting.  Neurological: Negative for tremors and weakness.  Psychiatric/Behavioral: Negative for agitation, behavioral problems, confusion, decreased concentration, dysphoric mood, hallucinations, self-injury, sleep disturbance and suicidal ideas. The patient is nervous/anxious. The patient is not hyperactive.     Medications: I have reviewed the patient's current medications.  Current Outpatient Medications  Medication Sig Dispense Refill  . almotriptan (AXERT) 12.5 MG tablet Take by mouth.    [name] atorvastatin (LIPITOR) 20 MG tablet Take 20 mg by mouth daily.    . Carbamazepine (EQUETRO) 300 MG CP12 Take two capsules twice a day and take two capsules at bedtime. (Patient taking differently: Take 400 mg by mouth 3 times/day as needed-between meals  & bedtime. Take two capsules twice a day and take two capsules at bedtime.) 360 capsule 1  . Cholecalciferol (VITAMIN D3) 10000 units TABS Take by mouth.    [name] glycopyrrolate (ROBINUL) 2 MG tablet daily.    [name] lamoTRIgine (LAMICTAL) 200 MG tablet 1 in am and 1/2 at night 135 tablet 1  . lisdexamfetamine ([name]) 60 MG capsule Take 1 capsule (60 mg total) by mouth every morning. 30 capsule 0  . lithium carbonate 300 MG capsule Take 3 capsules (900 mg total) by mouth at bedtime. 270 capsule 0  . LORYNA 3-0.02 MG tablet TAKE ONE TABLET EACH DAY  3  . Lurasidone HCl ([name]) 120 MG TABS Take 1 tablet (120 mg total) by mouth daily. 90 tablet 0  . metFORMIN (GLUCOPHAGE) 500 MG tablet 1000 mg BID with meals. Needs appt prior to anymore refills. 60 tablet 0  . rizatriptan (MAXALT-MLT) 10 MG disintegrating tablet Take 1 tablet (10 mg total) by mouth as needed for migraine. May repeat in 2 hours if needed 9 tablet 11  . valACYclovir (VALTREX) 1000 MG tablet 2000 mg every 12 hours for 2 doses. 8 tablet 3  . azithromycin (ZITHROMAX) 250 MG tablet 500 mg day 1, then 250 mg. (Patient not taking: Reported on 02/21/2018) 6 tablet 0  . BD SAFETYGLIDE SHIELDED NEEDLE 25G X 1" MISC USE AS DIRECTED 10 each 7  . benzonatate (TESSALON) 200 MG capsule Take 1 capsule (200 mg total) by mouth 3 (three) times daily as needed for cough. (Patient not taking: Reported on 03/30/2018) 30 capsule 0  . clonazePAM (KLONOPIN) 0.5 MG tablet Take 0.5 mg by  mouth 2 (two) times daily as needed for anxiety. 1 - 1.5 bid prn    . Galcanezumab-gnlm 120 MG/ML SOAJ Inject into the skin.    [name] ketorolac (TORADOL) 30 MG/ML injection One cc IM prn migraine.  No more than 2/day Dispense with 8 (eight)    3cc syringes with 1 inch 25g needles (Patient not taking: Reported on 02/21/2018) 8 mL 11  . ocrelizumab (OCREVUS) 300 MG/10ML injection Inject into the vein.    [name] ondansetron (ZOFRAN) 8 MG tablet Take 1 tablet (8 mg total) by mouth 2 (two) times  daily. (Patient not taking: Reported on 03/30/2018) 60 tablet 2  . promethazine (PHENERGAN) 25 MG tablet Take 1 tablet (25 mg total) by mouth every 8 (eight) hours as needed for nausea or vomiting. (Patient not taking: Reported on 03/30/2018) 20 tablet 0  . risperiDONE (RISPERDAL) 1 MG tablet TAKE 1 TABLET BY MOUTH TWICE A DAY (Patient not taking: Reported on 03/30/2018) 180 tablet 0   No current facility-administered medications for this visit.     Medication Side Effects: Nausea and Abdominal Pain  Allergies: No Known Allergies  Past Medical History:  Diagnosis Date  . Anxiety   . Depression   . Diabetes type 2, controlled (HCC)   . Elevated hemoglobin A1c   . Hyperlipidemia   . Migraine   . Multiple sclerosis (HCC)    receives Tysabri infusion q 28 days    Family History  Problem Relation Age of Onset  . Diabetes Mother   . Hypertension Mother   . Dementia Mother   . Bipolar disorder Mother   . Heart failure Father   . Hypertension Father   . Heart disease Father   . Ovarian cancer Maternal Grandmother   . Alzheimer's disease Paternal Grandmother   . Skin cancer Maternal Grandfather     Social History   Socioeconomic History  . Marital status: Married    Spouse name: Anita Black  . Number of children: 1  . Years of education: MA  . Highest education level: Not on file  Occupational History    Employer: [employer]  Social Needs  . Financial resource strain: Not on file  . Food insecurity:    Worry: Not on file    Inability: Not on file  . Transportation needs:    Medical: Not on file    Non-medical: Not on file  Tobacco Use  . Smoking status: Never Smoker  . Smokeless tobacco: Never Used  Substance and Sexual Activity  . Alcohol use: Yes    Comment: socially; once a week  . Drug use: No  . Sexual activity: Yes    Birth control/protection: Pill  Lifestyle  . Physical activity:    Days per week: Not on file    Minutes per session: Not on file  .  Stress: Not on file  Relationships  . Social connections:    Talks on phone: Not on file    Gets together: Not on file    Attends religious service: Not on file    Active member of club or organization: Not on file    Attends meetings of clubs or organizations: Not on file    Relationship status: Not on file  . Intimate partner violence:    Fear of current or ex partner: Not on file    Emotionally abused: Not on file    Physically abused: Not on file    Forced sexual activity: Not on file  Other Topics Concern  .  Not on file  Social History Narrative   Patient lives at home with family.   Caffeine Use: 1-2 cups daily   No tobacco, recreational drugs. Social drinking only.   Wears her seatbelt, has smoke alarm in the home.   Feels safe in her relationships.    Past Medical History, Surgical history, Social history, and Family history were reviewed and updated as appropriate.   Please see review of systems for further details on the patient's review from today.   Objective:   Physical Exam:  There were no vitals taken for this visit.  Physical Exam  Constitutional: She is oriented to person, place, and time. She appears well-developed. No distress.  Musculoskeletal: She exhibits no deformity.  Neurological: She is alert and oriented to person, place, and time. She displays no tremor. Coordination and gait normal.  Psychiatric: Her speech is normal and behavior is normal. Judgment and thought content normal. Her mood appears anxious. Her affect is not angry, not blunt, not labile and not inappropriate. She is not agitated, not aggressive, not hyperactive and not slowed. Thought content is not paranoid. Cognition and memory are normal. She does not exhibit a depressed mood. She expresses no homicidal and no suicidal ideation. She expresses no suicidal plans and no homicidal plans.  Insight intact. No auditory or visual hallucinations.  She is attentive.    Lab Review:      Component Value Date/Time   NA 135 12/25/2016 1254   NA 137 04/15/2014 1554   K 4.0 12/25/2016 1254   CL 100 (L) 12/25/2016 1254   CO2 22 12/25/2016 1254   GLUCOSE 94 12/25/2016 1254   BUN 14 12/25/2016 1254   BUN 15 04/15/2014 1554   CREATININE 0.75 12/25/2016 1254   CREATININE 0.90 01/30/2016 1555   CALCIUM 9.2 12/25/2016 1254   PROT 6.2 (L) 10/22/2016 1009   PROT 6.9 12/29/2015 1400   ALBUMIN 3.3 (L) 10/22/2016 1009   ALBUMIN 4.5 12/29/2015 1400   AST 26 10/22/2016 1009   ALT 25 10/22/2016 1009   ALKPHOS 81 10/22/2016 1009   BILITOT 1.0 10/22/2016 1009   BILITOT 0.6 12/29/2015 1400   GFRNONAA >60 12/25/2016 1254   GFRAA >60 12/25/2016 1254       Component Value Date/Time   WBC 8.1 12/25/2016 1254   RBC 4.34 12/25/2016 1254   HGB 12.0 12/25/2016 1254   HGB 11.9 06/30/2016 1122   HCT 35.8 (L) 12/25/2016 1254   HCT 36.2 06/30/2016 1122   PLT 255 12/25/2016 1254   PLT 237 06/30/2016 1122   MCV 82.5 12/25/2016 1254   MCV 81 06/30/2016 1122   MCH 27.6 12/25/2016 1254   MCHC 33.5 12/25/2016 1254   RDW 14.5 12/25/2016 1254   RDW 14.6 06/30/2016 1122   LYMPHSABS 1.6 10/22/2016 1009   LYMPHSABS 3.6 (H) 06/30/2016 1122   MONOABS 0.3 10/22/2016 1009   EOSABS 0.1 10/22/2016 1009   EOSABS 0.1 06/30/2016 1122   BASOSABS 0.0 10/22/2016 1009   BASOSABS 0.0 06/30/2016 1122    No results found for: POCLITH, LITHIUM   No results found for: PHENYTOIN, PHENOBARB, VALPROATE, CBMZ   .res Assessment: Plan:    Bipolar II disorder (HCC)  Panic disorder with agoraphobia  Generalized anxiety disorder  Insomnia due to mental condition  We discussed her chronically unstable bipolar disorder with a history of abrupt worsening of depression with suicidal ideation.  She acknowledges significant mood benefits from the [facility] but feels that she cannot tolerate the GI side  effects and is concerned about the drug interaction potential.  Disc categories of mood stabilizers including  the atypicals and non-atypicals. Conrad  has helped her mood.  Disc option of Trileptal, it is milder but still has the potential of induction similar but usually less severe than CBZ.  Disc very real risk of mood destabilization off the CBZ.  Options, Trileptal, Vraylar, and other atypicals.  Disc SE differences.   Wean Carbamazapine by 300mg  every 3 days.  Disc WD risks.  She agrees to try the Trileptal because of the mood benefit from carbamazepine.  We will start at 150 twice a day and increase to 300 twice daily and's 6-12 days.  If she still has drug interaction issues or has GI problems with the Trileptal also then we will discontinue it and use Vraylar in its place.  We discussed the side effects of it.  Discussed potential metabolic side effects associated with atypical antipsychotics, as well as potential risk for movement side effects. Advised pt to contact office if movement side effects occur.   This was 35-minute appointment  Follow-up 4 weeks  Meredith Staggers MD, DFAPA   Please see After Visit Summary for patient specific instructions.  No future appointments.  No orders of the defined types were placed in this encounter.     -------------------------------

## 2018-03-30 NOTE — Patient Instructions (Signed)
Reduce Equetro by 1 capsule every 3 days.  Start Trileptal (oxcarbazepine) in it's place.

## 2018-03-31 ENCOUNTER — Telehealth: Payer: Self-pay | Admitting: Psychiatry

## 2018-03-31 NOTE — Telephone Encounter (Signed)
Please reply.

## 2018-03-31 NOTE — Telephone Encounter (Signed)
During office visit the medication prescribed is having the same interactions as the other. Would like the 2nd option that was discussed.

## 2018-04-03 NOTE — Telephone Encounter (Signed)
Spoke with pt this morning and given message from provider, has not started the medication yet. Instructed to start medication then get back with [place] after she's been taking.

## 2018-04-22 ENCOUNTER — Other Ambulatory Visit: Payer: Self-pay | Admitting: Psychiatry

## 2018-04-28 ENCOUNTER — Ambulatory Visit (INDEPENDENT_AMBULATORY_CARE_PROVIDER_SITE_OTHER): Payer: 59 | Admitting: Psychiatry

## 2018-04-28 ENCOUNTER — Encounter: Payer: Self-pay | Admitting: Psychiatry

## 2018-04-28 DIAGNOSIS — F4001 Agoraphobia with panic disorder: Secondary | ICD-10-CM

## 2018-04-28 DIAGNOSIS — F902 Attention-deficit hyperactivity disorder, combined type: Secondary | ICD-10-CM

## 2018-04-28 DIAGNOSIS — F5105 Insomnia due to other mental disorder: Secondary | ICD-10-CM

## 2018-04-28 DIAGNOSIS — F411 Generalized anxiety disorder: Secondary | ICD-10-CM | POA: Diagnosis not present

## 2018-04-28 DIAGNOSIS — F3181 Bipolar II disorder: Secondary | ICD-10-CM | POA: Diagnosis not present

## 2018-04-28 MED ORDER — OXCARBAZEPINE 300 MG PO TABS: ORAL_TABLET | ORAL | 1 refills | Status: DC

## 2018-04-28 MED ORDER — LURASIDONE HCL 120 MG PO TABS: 120.0000 mg | ORAL_TABLET | Freq: Every day | ORAL | 0 refills | Status: DC

## 2018-04-28 NOTE — Progress Notes (Signed)
Anita Black [id number] 06-29-1980 37 y.o.  Subjective:   Patient ID:  Anita Black is a 37 y.o. (DOB [birth year]) female.  Chief Complaint:  Chief Complaint  Patient presents with  . Depression  . Anxiety  . Medication Problem    change from CBZ to Trileptal    HPI last seen March 30, 2018 she is seen as an urgent work in today. [name] presents to the office today for follow-up of bipolar disorder and anxiety.  Switched to Trileptal.  No GI issues off the CBZ anymore. Tolerating the Trileptal.  Mood has been fine.  A little weepy and emotional but may be the time of year.  Still concerned about DDI with other meds as discussed but doesn't want a change right now.  Patient reports stable mood and denies depressed or irritable moods.  Patient continues with recent difficulty with anxiety.  Patient denies difficulty with sleep initiation or maintenance. Denies appetite disturbance.  Patient reports that energy and motivation have been good.  Patient denies any difficulty with concentration.  Patient denies any suicidal ideation.   Review of Systems:  Review of Systems  Gastrointestinal: Negative for abdominal pain, nausea and vomiting.  Neurological: Negative for tremors and weakness.  Psychiatric/Behavioral: Negative for agitation, behavioral problems, confusion, decreased concentration, dysphoric mood, hallucinations, self-injury, sleep disturbance and suicidal ideas. The patient is nervous/anxious. The patient is not hyperactive.     Medications: I have reviewed the patient's current medications.  Current Outpatient Medications  Medication Sig Dispense Refill  . almotriptan (AXERT) 12.5 MG tablet Take by mouth.    [name] atorvastatin (LIPITOR) 20 MG tablet Take 20 mg by mouth daily.    . BD SAFETYGLIDE SHIELDED NEEDLE 25G X 1" MISC USE AS DIRECTED 10 each 7  . Cholecalciferol (VITAMIN D3) 10000 units TABS Take by mouth.    . clonazePAM (KLONOPIN) 0.5 MG tablet  Take 0.5 mg by mouth 2 (two) times daily as needed for anxiety. 1 - 1.5 bid prn    . Galcanezumab-gnlm 120 MG/ML SOAJ Inject into the skin.    [name] glycopyrrolate (ROBINUL) 2 MG tablet daily.    [name] lamoTRIgine (LAMICTAL) 200 MG tablet Take 200 mg by mouth 2 (two) times daily.    [name] lisdexamfetamine ([name]) 60 MG capsule Take 1 capsule (60 mg total) by mouth every morning. 30 capsule 0  . lithium carbonate 300 MG capsule Take 3 capsules (900 mg total) by mouth at bedtime. 270 capsule 0  . LORYNA 3-0.02 MG tablet TAKE ONE TABLET EACH DAY  3  . Lurasidone HCl ([name]) 120 MG TABS Take 1 tablet (120 mg total) by mouth daily. 90 tablet 0  . metFORMIN (GLUCOPHAGE) 500 MG tablet 1000 mg BID with meals. Needs appt prior to anymore refills. 60 tablet 0  . ocrelizumab (OCREVUS) 300 MG/10ML injection Inject into the vein.    [name] ondansetron (ZOFRAN) 8 MG tablet Take 1 tablet (8 mg total) by mouth 2 (two) times daily. 60 tablet 2  . Oxcarbazepine (TRILEPTAL) 300 MG tablet 1 in AM and 2 in PM 90 tablet 1  . rizatriptan (MAXALT-MLT) 10 MG disintegrating tablet Take 1 tablet (10 mg total) by mouth as needed for migraine. May repeat in 2 hours if needed 9 tablet 11  . valACYclovir (VALTREX) 1000 MG tablet 2000 mg every 12 hours for 2 doses. 8 tablet 3  . azithromycin (ZITHROMAX) 250 MG tablet 500 mg day 1, then 250 mg. (Patient not taking: Reported on  02/21/2018) 6 tablet 0  . benzonatate (TESSALON) 200 MG capsule Take 1 capsule (200 mg total) by mouth 3 (three) times daily as needed for cough. (Patient not taking: Reported on 03/30/2018) 30 capsule 0  . ketorolac (TORADOL) 30 MG/ML injection One cc IM prn migraine.  No more than 2/day Dispense with 8 (eight)    3cc syringes with 1 inch 25g needles (Patient not taking: Reported on 04/28/2018) 8 mL 11  . promethazine (PHENERGAN) 25 MG tablet Take 1 tablet (25 mg total) by mouth every 8 (eight) hours as needed for nausea or vomiting. (Patient not taking: Reported on  03/30/2018) 20 tablet 0  . risperiDONE (RISPERDAL) 1 MG tablet TAKE 1 TABLET BY MOUTH TWICE A DAY (Patient not taking: Reported on 03/30/2018) 180 tablet 0   No current facility-administered medications for this visit.     Medication Side Effects: Nausea and Abdominal Pain  Allergies: No Known Allergies  Past Medical History:  Diagnosis Date  . Anxiety   . Depression   . Diabetes type 2, controlled (HCC)   . Elevated hemoglobin A1c   . Hyperlipidemia   . Migraine   . Multiple sclerosis (HCC)    receives Tysabri infusion q 28 days    Family History  Problem Relation Age of Onset  . Diabetes Mother   . Hypertension Mother   . Dementia Mother   . Bipolar disorder Mother   . Heart failure Father   . Hypertension Father   . Heart disease Father   . Ovarian cancer Maternal Grandmother   . Alzheimer's disease Paternal Grandmother   . Skin cancer Maternal Grandfather     Social History   Socioeconomic History  . Marital status: Married    Spouse name: Rollen Sox  . Number of children: 1  . Years of education: MA  . Highest education level: Not on file  Occupational History    Employer: [employer]  Social Needs  . Financial resource strain: Not on file  . Food insecurity:    Worry: Not on file    Inability: Not on file  . Transportation needs:    Medical: Not on file    Non-medical: Not on file  Tobacco Use  . Smoking status: Never Smoker  . Smokeless tobacco: Never Used  Substance and Sexual Activity  . Alcohol use: Yes    Comment: socially; once a week  . Drug use: No  . Sexual activity: Yes    Birth control/protection: Pill  Lifestyle  . Physical activity:    Days per week: Not on file    Minutes per session: Not on file  . Stress: Not on file  Relationships  . Social connections:    Talks on phone: Not on file    Gets together: Not on file    Attends religious service: Not on file    Active member of club or organization: Not on file    Attends  meetings of clubs or organizations: Not on file    Relationship status: Not on file  . Intimate partner violence:    Fear of current or ex partner: Not on file    Emotionally abused: Not on file    Physically abused: Not on file    Forced sexual activity: Not on file  Other Topics Concern  . Not on file  Social History Narrative   Patient lives at home with family.   Caffeine Use: 1-2 cups daily   No tobacco, recreational drugs. Social drinking only.  Wears her seatbelt, has smoke alarm in the home.   Feels safe in her relationships.    Past Medical History, Surgical history, Social history, and Family history were reviewed and updated as appropriate.   Please see review of systems for further details on the patient's review from today.   Objective:   Physical Exam:  There were no vitals taken for this visit.  Physical Exam Constitutional:      General: She is not in acute distress.    Appearance: She is well-developed.  Musculoskeletal:        General: No deformity.  Neurological:     Mental Status: She is alert and oriented to person, place, and time.     Motor: No tremor.     Coordination: Coordination normal.     Gait: Gait normal.  Psychiatric:        Attention and Perception: She is attentive.        Mood and Affect: Mood is anxious. Mood is not depressed. Affect is not labile, blunt, angry or inappropriate.        Speech: Speech normal.        Behavior: Behavior normal. Behavior is not agitated, slowed, aggressive or hyperactive.        Thought Content: Thought content normal. Thought content is not paranoid. Thought content does not include homicidal or suicidal ideation. Thought content does not include homicidal or suicidal plan.        Judgment: Judgment normal.     Comments: Insight intact. No auditory or visual hallucinations.  Anxiety is still high.     Lab Review:     Component Value Date/Time   NA 135 12/25/2016 1254   NA 137 04/15/2014 1554   K  4.0 12/25/2016 1254   CL 100 (L) 12/25/2016 1254   CO2 22 12/25/2016 1254   GLUCOSE 94 12/25/2016 1254   BUN 14 12/25/2016 1254   BUN 15 04/15/2014 1554   CREATININE 0.75 12/25/2016 1254   CREATININE 0.90 01/30/2016 1555   CALCIUM 9.2 12/25/2016 1254   PROT 6.2 (L) 10/22/2016 1009   PROT 6.9 12/29/2015 1400   ALBUMIN 3.3 (L) 10/22/2016 1009   ALBUMIN 4.5 12/29/2015 1400   AST 26 10/22/2016 1009   ALT 25 10/22/2016 1009   ALKPHOS 81 10/22/2016 1009   BILITOT 1.0 10/22/2016 1009   BILITOT 0.6 12/29/2015 1400   GFRNONAA >60 12/25/2016 1254   GFRAA >60 12/25/2016 1254       Component Value Date/Time   WBC 8.1 12/25/2016 1254   RBC 4.34 12/25/2016 1254   HGB 12.0 12/25/2016 1254   HGB 11.9 06/30/2016 1122   HCT 35.8 (L) 12/25/2016 1254   HCT 36.2 06/30/2016 1122   PLT 255 12/25/2016 1254   PLT 237 06/30/2016 1122   MCV 82.5 12/25/2016 1254   MCV 81 06/30/2016 1122   MCH 27.6 12/25/2016 1254   MCHC 33.5 12/25/2016 1254   RDW 14.5 12/25/2016 1254   RDW 14.6 06/30/2016 1122   LYMPHSABS 1.6 10/22/2016 1009   LYMPHSABS 3.6 (H) 06/30/2016 1122   MONOABS 0.3 10/22/2016 1009   EOSABS 0.1 10/22/2016 1009   EOSABS 0.1 06/30/2016 1122   BASOSABS 0.0 10/22/2016 1009   BASOSABS 0.0 06/30/2016 1122    No results found for: POCLITH, LITHIUM   No results found for: PHENYTOIN, PHENOBARB, VALPROATE, CBMZ   .res Assessment: Plan:    Bipolar II disorder (HCC)  Panic disorder with agoraphobia  Generalized anxiety disorder  Insomnia due  to mental condition  Attention deficit hyperactivity disorder (ADHD), combined type  Greater than 50% of face to face time with patient was spent on counseling and coordination of care. We discussed her chronically unstable bipolar disorder with a history of abrupt worsening of depression with suicidal ideation.  She acknowledges significant mood benefits from the [facility] but could not tolerate the GI side effects and is concerned about the drug  interaction potential.  Disc categories of mood stabilizers including the atypicals and non-atypicals. [facility] has helped her mood.  Disc option of Trileptal, it is milder but still has the potential of induction similar but usually less severe than CBZ.  Disc very real risk of mood destabilization off the CBZ, but so far her mood is stable enough with the transition to the low-dose of Trileptal.  Discussed in detail the dosage ranges of Trileptal.  Discussed that she is on a fairly low dose at this time.  But we will not make a change today.  If mood swings return then increase the Trileptal to 1 in the AM and 2 in PM and call us to inform us. Options, Trileptal, Vraylar, and other atypicals.  Disc SE differences.   If she still has drug interaction issues with the Trileptal also then we will discontinue it and use Vraylar in its place.  We discussed the side effects of it.  Discussed potential metabolic side effects associated with atypical antipsychotics, as well as potential risk for movement side effects. Advised pt to contact office if movement side effects occur.   Her anxiety is not well managed but we will not initiate further med changes today because of the side effect issues she has had with so many things were recently.  We may need to further address this in the future.  Her insomnia is managed  This was 25-minute appointment  Follow-up 8 weeks  Meredith Staggersarey Cottle MD, DFAPA   Please see After Visit Summary for patient specific instructions.  No future appointments.  No orders of the defined types were placed in this encounter.     -------------------------------

## 2018-04-28 NOTE — Patient Instructions (Signed)
If mood swings return then increase the Trileptal to 1 in the AM and 2 in PM and call us to inform us.

## 2018-05-27 ENCOUNTER — Other Ambulatory Visit: Payer: Self-pay | Admitting: Psychiatry

## 2018-05-29 ENCOUNTER — Telehealth: Payer: Self-pay | Admitting: Psychiatry

## 2018-05-29 NOTE — Telephone Encounter (Signed)
Pt called to ask for [name] samples for 3 weeks 120 mg  1/d. Meds are about $1000 now. In a few weeks deductible  will be met. Will be out in about 5 days

## 2018-05-30 NOTE — Telephone Encounter (Signed)
Spoke with pt and let her know samples are available to pick up and to check back once she is low. Will meet deductible in Feb. And does have a discount card for her insurance.

## 2018-06-19 ENCOUNTER — Telehealth: Payer: Self-pay | Admitting: Psychiatry

## 2018-06-19 NOTE — Telephone Encounter (Signed)
Patient stated she is having crying spells and anger issues, wants to know if Lithium can be increased patient has appt 02/24

## 2018-06-19 NOTE — Telephone Encounter (Signed)
Please review

## 2018-06-19 NOTE — Telephone Encounter (Signed)
See phone message

## 2018-06-19 NOTE — Telephone Encounter (Signed)
At the last visit we discussed "If mood swings return then increase the Trileptal to 1 in the AM and 2 in PM".  This is what I'd recommend unless she can't tolerate it.  From our prior discussions she claimed she couldn't tolerate more lithium DT diarrhea.  If she's changed her mind and wants to go up in lithium instead, then ok to 1200mg  daily.  Meredith Staggers, MD, DFAPA

## 2018-06-20 NOTE — Telephone Encounter (Signed)
  Pt already taking Trileptal 1 in the AM and 2 in the PM. She agreed to increasing lithium to 1200mg /hs. Leaving for a cruise this weekend and wants to stop crying and not being so irritated.  Instructed to call back if having any problems or concerns.

## 2018-06-20 NOTE — Telephone Encounter (Signed)
Left voice mail to call back 

## 2018-06-26 ENCOUNTER — Other Ambulatory Visit: Payer: Self-pay | Admitting: Psychiatry

## 2018-06-27 NOTE — Telephone Encounter (Signed)
Clarify if pt still taking?

## 2018-06-28 NOTE — Telephone Encounter (Signed)
Still taking this?

## 2018-06-28 NOTE — Telephone Encounter (Signed)
"  Risperidone did not like it, too laid back.  Took it about 5 times.  Didn't help the nausea."  As far as I know she is not taking this.  Meredith Staggers, MD, DFAPA

## 2018-07-03 ENCOUNTER — Ambulatory Visit (INDEPENDENT_AMBULATORY_CARE_PROVIDER_SITE_OTHER): Payer: 59 | Admitting: Psychiatry

## 2018-07-03 ENCOUNTER — Encounter: Payer: Self-pay | Admitting: Psychiatry

## 2018-07-03 DIAGNOSIS — F5105 Insomnia due to other mental disorder: Secondary | ICD-10-CM

## 2018-07-03 DIAGNOSIS — F4001 Agoraphobia with panic disorder: Secondary | ICD-10-CM

## 2018-07-03 DIAGNOSIS — F411 Generalized anxiety disorder: Secondary | ICD-10-CM

## 2018-07-03 DIAGNOSIS — F3160 Bipolar disorder, current episode mixed, unspecified: Secondary | ICD-10-CM

## 2018-07-03 MED ORDER — DIVALPROEX SODIUM ER 500 MG PO TB24: 1500.0000 mg | ORAL_TABLET | Freq: Every day | ORAL | 1 refills | Status: DC

## 2018-07-03 NOTE — Progress Notes (Signed)
Anita Black [id number] Apr 26, 1981 38 y.o.  Subjective:   Patient ID:  Anita Black is a 38 y.o. (DOB 10-15-1980) female.  Chief Complaint:  Chief Complaint  Patient presents with  . Follow-up    Medication management    HPI last seen December 20 , 2019  Anita Black presents to the office today for follow-up of bipolar disorder and anxiety.  Anxiety "off the charts".  Terrible mood swings from fine to angry.  She increased the lithium to 1200 mg daily.  Somethings causing nausea again.  Higher dosage of lithium 2 weeks with less diarrhea.  Seen a little benefit for anger not anxiety.  Doesn't want to increase Trileptal DT fears of BCP interactions and runny nose.  "I've got to be fixed"  Sleep OK.  A little depression DT the other problems.  Guilt over her behavior towards family. No other manic symptoms except as noted.  past psych meds:  [name], lithiuum CBZ, [name], Rexulti, lamotrigine, [name] 120, Zoloft 150, Trintellix, , mirtazapine, Lexapro, doxazosin.   Review of Systems:  Review of Systems  Neurological: Negative for tremors and weakness.  Psychiatric/Behavioral: Positive for agitation, behavioral problems and dysphoric mood. Negative for confusion, decreased concentration, hallucinations, self-injury, sleep disturbance and suicidal ideas. The patient is nervous/anxious. The patient is not hyperactive.     Medications: I have reviewed the patient's current medications.  Current Outpatient Medications  Medication Sig Dispense Refill  . atorvastatin (LIPITOR) 20 MG tablet Take 20 mg by mouth daily.    . BD SAFETYGLIDE SHIELDED NEEDLE 25G X 1" MISC USE AS DIRECTED 10 each 7  . Cholecalciferol (VITAMIN D3) 10000 units TABS Take by mouth.    . lamoTRIgine (LAMICTAL) 200 MG tablet Take 200 mg by mouth 2 (two) times daily.    [name] lisdexamfetamine ([name]) 60 MG capsule Take 1 capsule (60 mg total) by mouth every morning. 30 capsule 0  . lithium carbonate 300  MG capsule Take 3 capsules (900 mg total) by mouth at bedtime. (Patient taking differently: Take 600 mg by mouth 2 (two) times daily. ) 270 capsule 0  . Lurasidone HCl ([name]) 120 MG TABS Take 1 tablet (120 mg total) by mouth daily. 90 tablet 0  . metFORMIN (GLUCOPHAGE) 500 MG tablet 1000 mg BID with meals. Needs appt prior to anymore refills. 60 tablet 0  . ocrelizumab (OCREVUS) 300 MG/10ML injection Inject into the vein.    [name] ondansetron (ZOFRAN) 8 MG tablet Take 1 tablet (8 mg total) by mouth 2 (two) times daily. 60 tablet 2  . Oxcarbazepine (TRILEPTAL) 300 MG tablet TAKE 1 TABLET BY MOUTH EVERY MORNING AND TAKE 2 TABLETS EVERY EVENING 90 tablet 2  . rizatriptan (MAXALT-MLT) 10 MG disintegrating tablet Take 1 tablet (10 mg total) by mouth as needed for migraine. May repeat in 2 hours if needed 9 tablet 11  . spironolactone (ALDACTONE) 50 MG tablet     . SPRINTEC 28 0.25-35 MG-MCG tablet     . valACYclovir (VALTREX) 1000 MG tablet 2000 mg every 12 hours for 2 doses. 8 tablet 3  . almotriptan (AXERT) 12.5 MG tablet Take by mouth.    . divalproex ([name] ER) 500 MG 24 hr tablet Take 3 tablets (1,500 mg total) by mouth daily. 90 tablet 1  . Galcanezumab-gnlm 120 MG/ML SOAJ Inject into the skin.    [name] glycopyrrolate (ROBINUL) 2 MG tablet daily.     No current facility-administered medications for this visit.     Medication  Side Effects: Nausea and Abdominal Pain  Allergies: No Known Allergies  Past Medical History:  Diagnosis Date  . Anxiety   . Depression   . Diabetes type 2, controlled (HCC)   . Elevated hemoglobin A1c   . Hyperlipidemia   . Migraine   . Multiple sclerosis (HCC)    receives Tysabri infusion q 28 days    Family History  Problem Relation Age of Onset  . Diabetes Mother   . Hypertension Mother   . Dementia Mother   . Bipolar disorder Mother   . Heart failure Father   . Hypertension Father   . Heart disease Father   . Ovarian cancer Maternal Grandmother   .  Alzheimer's disease Paternal Grandmother   . Skin cancer Maternal Grandfather     Social History   Socioeconomic History  . Marital status: Married    Spouse name: Rollen Sox  . Number of children: 1  . Years of education: MA  . Highest education level: Not on file  Occupational History    Employer: [employer]  Social Needs  . Financial resource strain: Not on file  . Food insecurity:    Worry: Not on file    Inability: Not on file  . Transportation needs:    Medical: Not on file    Non-medical: Not on file  Tobacco Use  . Smoking status: Never Smoker  . Smokeless tobacco: Never Used  Substance and Sexual Activity  . Alcohol use: Yes    Comment: socially; once a week  . Drug use: No  . Sexual activity: Yes    Birth control/protection: Pill  Lifestyle  . Physical activity:    Days per week: Not on file    Minutes per session: Not on file  . Stress: Not on file  Relationships  . Social connections:    Talks on phone: Not on file    Gets together: Not on file    Attends religious service: Not on file    Active member of club or organization: Not on file    Attends meetings of clubs or organizations: Not on file    Relationship status: Not on file  . Intimate partner violence:    Fear of current or ex partner: Not on file    Emotionally abused: Not on file    Physically abused: Not on file    Forced sexual activity: Not on file  Other Topics Concern  . Not on file  Social History Narrative   Patient lives at home with family.   Caffeine Use: 1-2 cups daily   No tobacco, recreational drugs. Social drinking only.   Wears her seatbelt, has smoke alarm in the home.   Feels safe in her relationships.    Past Medical History, Surgical history, Social history, and Family history were reviewed and updated as appropriate.   Please see review of systems for further details on the patient's review from today.   Objective:   Physical Exam:  There were no vitals  taken for this visit.  Physical Exam Constitutional:      General: She is not in acute distress.    Appearance: She is well-developed.  Musculoskeletal:        General: No deformity.  Neurological:     Mental Status: She is alert and oriented to person, place, and time.     Motor: No tremor.     Coordination: Coordination normal.     Gait: Gait normal.  Psychiatric:  Attention and Perception: Perception normal. She is attentive.        Mood and Affect: Mood is anxious and depressed. Affect is not labile, blunt, angry or inappropriate.        Speech: Speech normal.        Behavior: Behavior normal. Behavior is not agitated, slowed, aggressive or hyperactive.        Thought Content: Thought content normal. Thought content is not paranoid. Thought content does not include homicidal or suicidal ideation. Thought content does not include homicidal or suicidal plan.        Cognition and Memory: Cognition normal.        Judgment: Judgment normal.     Comments: Insight intact. No auditory or visual hallucinations.  Anxiety is still high.  Anger outbursts.     Lab Review:     Component Value Date/Time   NA 135 12/25/2016 1254   NA 137 04/15/2014 1554   K 4.0 12/25/2016 1254   CL 100 (L) 12/25/2016 1254   CO2 22 12/25/2016 1254   GLUCOSE 94 12/25/2016 1254   BUN 14 12/25/2016 1254   BUN 15 04/15/2014 1554   CREATININE 0.75 12/25/2016 1254   CREATININE 0.90 01/30/2016 1555   CALCIUM 9.2 12/25/2016 1254   PROT 6.2 (L) 10/22/2016 1009   PROT 6.9 12/29/2015 1400   ALBUMIN 3.3 (L) 10/22/2016 1009   ALBUMIN 4.5 12/29/2015 1400   AST 26 10/22/2016 1009   ALT 25 10/22/2016 1009   ALKPHOS 81 10/22/2016 1009   BILITOT 1.0 10/22/2016 1009   BILITOT 0.6 12/29/2015 1400   GFRNONAA >60 12/25/2016 1254   GFRAA >60 12/25/2016 1254       Component Value Date/Time   WBC 8.1 12/25/2016 1254   RBC 4.34 12/25/2016 1254   HGB 12.0 12/25/2016 1254   HGB 11.9 06/30/2016 1122   HCT  35.8 (L) 12/25/2016 1254   HCT 36.2 06/30/2016 1122   PLT 255 12/25/2016 1254   PLT 237 06/30/2016 1122   MCV 82.5 12/25/2016 1254   MCV 81 06/30/2016 1122   MCH 27.6 12/25/2016 1254   MCHC 33.5 12/25/2016 1254   RDW 14.5 12/25/2016 1254   RDW 14.6 06/30/2016 1122   LYMPHSABS 1.6 10/22/2016 1009   LYMPHSABS 3.6 (H) 06/30/2016 1122   MONOABS 0.3 10/22/2016 1009   EOSABS 0.1 10/22/2016 1009   EOSABS 0.1 06/30/2016 1122   BASOSABS 0.0 10/22/2016 1009   BASOSABS 0.0 06/30/2016 1122    No results found for: POCLITH, LITHIUM   No results found for: PHENYTOIN, PHENOBARB, VALPROATE, CBMZ   .res Assessment: Plan:    Bipolar I disorder, most recent episode mixed (HCC)  Generalized anxiety disorder  Panic disorder with agoraphobia  Insomnia due to mental condition  Greater than 50% of face to face time with patient was spent on counseling and coordination of care. We discussed her chronically unstable bipolar disorder with a history of abrupt worsening of depression with suicidal ideation.  She has had multiple med failures as noted above.  She acknowledges significant mood benefits from the [facility] but could not tolerate the GI side effects and is concerned about the drug interaction potential.  Disc categories of mood stabilizers including the atypicals and non-atypicals. Equetro had helped her mood.  She also felt the need to change from carbamazepine because of interaction with hormones.  She now feels the same way about Trileptal and is reluctant to consider an increase in the dosage.  Disc option of Trileptal, it  is milder but still has the potential of induction similar but usually less severe than CBZ.  Disc very real risk of mood destabilization off the CBZ, and it has.    Discussed in detail the dosage ranges of Trileptal.  Discussed that she is on a fairly low dose at this time.    Options, Trileptal, Vraylar, and other atypicals.  Disc SE differences.  She says she is willing to  reconsider [name].  She took it briefly before but felt more hungry and was reluctant to restart it.  Now she has a higher priority of achieving some symptom control and therefore is willing to consider it.  Discussed side effects including risk of hepatitis, pancreatitis, and blood cell effects.  Also discussed the more usual types of side effects that can occur.  She will let us know if she has any.  Reduce Trileptal to 1 twice daily and start 1 [name] at night for 3-4 nights. Then increase [name] to 2 daily and reduce Trileptal to 1 in the morning for 5-7 nights. Then stop Trileptal and if tolerated increase [name] to 3 at night  Reduce lamotrigine by 50% 200 mg daily.  Her anxiety is not well managed but we will not initiate further med changes today because of the side effect issues she has had with so many things were recently.  We may need to further address this in the future.  Her insomnia is managed  This was 30-minute appointment  Follow-up 3-4 weeks  Meredith Staggers MD, DFAPA   Please see After Visit Summary for patient specific instructions.  Future Appointments  Date Time Provider Department Center  08/03/2018 10:45 AM Cottle, Steva Ready., MD [facility] None    No orders of the defined types were placed in this encounter.     -------------------------------

## 2018-07-03 NOTE — Patient Instructions (Addendum)
Reduce Trileptal to 1 twice daily and start 1 [name] at night for 3-4 nights. Then increase [name] to 2 daily and reduce Trileptal to 1 in the morning for 5-7 nights. Then stop Trileptal and if tolerated increase [name] to 3 at night  Reduce lamotrigine by 50% 200 mg daily.

## 2018-07-13 ENCOUNTER — Other Ambulatory Visit: Payer: Self-pay | Admitting: Psychiatry

## 2018-08-03 ENCOUNTER — Encounter: Payer: Self-pay | Admitting: Psychiatry

## 2018-08-03 ENCOUNTER — Other Ambulatory Visit: Payer: Self-pay

## 2018-08-03 ENCOUNTER — Ambulatory Visit (INDEPENDENT_AMBULATORY_CARE_PROVIDER_SITE_OTHER): Payer: 59 | Admitting: Psychiatry

## 2018-08-03 DIAGNOSIS — F5105 Insomnia due to other mental disorder: Secondary | ICD-10-CM

## 2018-08-03 DIAGNOSIS — F3160 Bipolar disorder, current episode mixed, unspecified: Secondary | ICD-10-CM

## 2018-08-03 DIAGNOSIS — F411 Generalized anxiety disorder: Secondary | ICD-10-CM | POA: Diagnosis not present

## 2018-08-03 DIAGNOSIS — F4001 Agoraphobia with panic disorder: Secondary | ICD-10-CM

## 2018-08-03 DIAGNOSIS — F902 Attention-deficit hyperactivity disorder, combined type: Secondary | ICD-10-CM

## 2018-08-03 DIAGNOSIS — Z79899 Other long term (current) drug therapy: Secondary | ICD-10-CM

## 2018-08-03 MED ORDER — LITHIUM CARBONATE 300 MG PO CAPS: 600.0000 mg | ORAL_CAPSULE | Freq: Two times a day (BID) | ORAL | 1 refills | Status: DC

## 2018-08-03 NOTE — Progress Notes (Addendum)
Anita Black [id number] October 30, 1980 38 y.o.  Subjective:   Patient ID:  Anita Black is a 38 y.o. (DOB 03-27-1981) female.  Chief Complaint:  Chief Complaint  Patient presents with  . Agitation  . Anxiety  . Medication Problem    Diarrhea from lithium  . Follow-up    Medication changes    HPI l [name] presents to the office today for follow-up of bipolar disorder and anxiety.  Last seen July 03, 2018.  Her symptoms had been under good control with an adequate dose of Equetro but she could not tolerate the GI side effects.  She was also concerned about drug interactions.  She had the same concerns about Trileptal.  Therefore we weaned her off Trileptal and switched to [name].  We also reduced the lamotrigine to 200 mg daily because of the drug to drug interaction.   Doing great.  [name] made a huge difference.  Like a fog lifted.  1000 mg caused benefit but 1500 mg eliminated mood sx.  Wonders if it may fail.   No longer mood swings nor anger outbursts.  Depression fine.  Anxiety still terrible but I still don't want to add anything else bc I'm on so much.  No hx drug abuse or alcohol abuse..   She continues the lithium to 1200 mg daily.  Somethings causing nausea again.  Higher dosage of lithium with less diarrhea manageable for right now.    Sleep OK.  A little depression DT the other problems.  Guilt over her behavior towards family. No other manic symptoms except as noted.  past psych meds:  [name], lithiuum CBZ, Equetro, Trileptal,, [name], Rexulti, lamotrigine, [name] 120, Zoloft 150, Trintellix, , mirtazapine, Lexapro, doxazosin.  Duloxetine side effects, long history of sertraline, Lexapro headaches, Trintellix, trazodone, mirtazapine, [name] 1500, Seroquel low dosages with weight gain, Adderall for MS fatigue, [name] for MS fatigue   Review of Systems:  Review of Systems  Neurological: Negative for tremors and weakness.   Psychiatric/Behavioral: Positive for agitation, behavioral problems and dysphoric mood. Negative for confusion, decreased concentration, hallucinations, self-injury, sleep disturbance and suicidal ideas. The patient is nervous/anxious. The patient is not hyperactive.     Medications: I have reviewed the patient's current medications.  Current Outpatient Medications  Medication Sig Dispense Refill  . atorvastatin (LIPITOR) 20 MG tablet Take 20 mg by mouth daily.    . BD SAFETYGLIDE SHIELDED NEEDLE 25G X 1" MISC USE AS DIRECTED 10 each 7  . Cholecalciferol (VITAMIN D3) 10000 units TABS Take by mouth.    . divalproex ([name] ER) 500 MG 24 hr tablet Take 3 tablets (1,500 mg total) by mouth daily. 90 tablet 1  . Galcanezumab-gnlm 120 MG/ML SOAJ Inject into the skin.    [name] glycopyrrolate (ROBINUL) 2 MG tablet daily.    [name] lamoTRIgine (LAMICTAL) 200 MG tablet Take 200 mg by mouth daily.     [name] lisdexamfetamine ([name]) 60 MG capsule Take 1 capsule (60 mg total) by mouth every morning. 30 capsule 0  . lithium carbonate 300 MG capsule Take 600 mg by mouth 2 (two) times daily with a meal.    . Lurasidone HCl ([name]) 120 MG TABS Take 1 tablet (120 mg total) by mouth daily. 90 tablet 0  . metFORMIN (GLUCOPHAGE) 500 MG tablet 1000 mg BID with meals. Needs appt prior to anymore refills. 60 tablet 0  . ocrelizumab (OCREVUS) 300 MG/10ML injection Inject into the vein.    . rizatriptan (MAXALT-MLT) 10 MG  disintegrating tablet Take 1 tablet (10 mg total) by mouth as needed for migraine. May repeat in 2 hours if needed 9 tablet 11  . spironolactone (ALDACTONE) 50 MG tablet     . SPRINTEC 28 0.25-35 MG-MCG tablet     . valACYclovir (VALTREX) 1000 MG tablet 2000 mg every 12 hours for 2 doses. 8 tablet 3  . almotriptan (AXERT) 12.5 MG tablet Take by mouth.    . ondansetron (ZOFRAN) 8 MG tablet Take 1 tablet (8 mg total) by mouth 2 (two) times daily. 60 tablet 2   No current facility-administered medications  for this visit.     Medication Side Effects: .Manageable diarrhea from lithium  Allergies: No Known Allergies  Past Medical History:  Diagnosis Date  . Anxiety   . Depression   . Diabetes type 2, controlled (HCC)   . Elevated hemoglobin A1c   . Hyperlipidemia   . Migraine   . Multiple sclerosis (HCC)    receives Tysabri infusion q 28 days    Family History  Problem Relation Age of Onset  . Diabetes Mother   . Hypertension Mother   . Dementia Mother   . Bipolar disorder Mother   . Heart failure Father   . Hypertension Father   . Heart disease Father   . Ovarian cancer Maternal Grandmother   . Alzheimer's disease Paternal Grandmother   . Skin cancer Maternal Grandfather     Social History   Socioeconomic History  . Marital status: Married    Spouse name: Rollen Sox  . Number of children: 1  . Years of education: MA  . Highest education level: Not on file  Occupational History    Employer: [employer]  Social Needs  . Financial resource strain: Not on file  . Food insecurity:    Worry: Not on file    Inability: Not on file  . Transportation needs:    Medical: Not on file    Non-medical: Not on file  Tobacco Use  . Smoking status: Never Smoker  . Smokeless tobacco: Never Used  Substance and Sexual Activity  . Alcohol use: Yes    Comment: socially; once a week  . Drug use: No  . Sexual activity: Yes    Birth control/protection: Pill  Lifestyle  . Physical activity:    Days per week: Not on file    Minutes per session: Not on file  . Stress: Not on file  Relationships  . Social connections:    Talks on phone: Not on file    Gets together: Not on file    Attends religious service: Not on file    Active member of club or organization: Not on file    Attends meetings of clubs or organizations: Not on file    Relationship status: Not on file  . Intimate partner violence:    Fear of current or ex partner: Not on file    Emotionally abused: Not on file     Physically abused: Not on file    Forced sexual activity: Not on file  Other Topics Concern  . Not on file  Social History Narrative   Patient lives at home with family.   Caffeine Use: 1-2 cups daily   No tobacco, recreational drugs. Social drinking only.   Wears her seatbelt, has smoke alarm in the home.   Feels safe in her relationships.    Past Medical History, Surgical history, Social history, and Family history were reviewed and updated as appropriate.  Please see review of systems for further details on the patient's review from today.   Objective:   Physical Exam:  There were no vitals taken for this visit.  Physical Exam Constitutional:      General: She is not in acute distress.    Appearance: She is well-developed.  Neurological:     Mental Status: She is alert and oriented to person, place, and time.  Psychiatric:        Attention and Perception: She is attentive. She does not perceive auditory or visual hallucinations.        Mood and Affect: Mood is anxious. Mood is not depressed. Affect is not labile, blunt, angry or inappropriate.        Speech: Speech normal.        Behavior: Behavior normal. Behavior is not agitated, slowed, aggressive or hyperactive.        Thought Content: Thought content normal. Thought content is not paranoid. Thought content does not include homicidal or suicidal ideation. Thought content does not include homicidal or suicidal plan.        Cognition and Memory: Cognition normal.        Judgment: Judgment normal.     Comments: Insight intact. No auditory or visual hallucinations.  Anxiety is still high.     Lab Review:     Component Value Date/Time   NA 135 12/25/2016 1254   NA 137 04/15/2014 1554   K 4.0 12/25/2016 1254   CL 100 (L) 12/25/2016 1254   CO2 22 12/25/2016 1254   GLUCOSE 94 12/25/2016 1254   BUN 14 12/25/2016 1254   BUN 15 04/15/2014 1554   CREATININE 0.75 12/25/2016 1254   CREATININE 0.90 01/30/2016 1555    CALCIUM 9.2 12/25/2016 1254   PROT 6.2 (L) 10/22/2016 1009   PROT 6.9 12/29/2015 1400   ALBUMIN 3.3 (L) 10/22/2016 1009   ALBUMIN 4.5 12/29/2015 1400   AST 26 10/22/2016 1009   ALT 25 10/22/2016 1009   ALKPHOS 81 10/22/2016 1009   BILITOT 1.0 10/22/2016 1009   BILITOT 0.6 12/29/2015 1400   GFRNONAA >60 12/25/2016 1254   GFRAA >60 12/25/2016 1254       Component Value Date/Time   WBC 8.1 12/25/2016 1254   RBC 4.34 12/25/2016 1254   HGB 12.0 12/25/2016 1254   HGB 11.9 06/30/2016 1122   HCT 35.8 (L) 12/25/2016 1254   HCT 36.2 06/30/2016 1122   PLT 255 12/25/2016 1254   PLT 237 06/30/2016 1122   MCV 82.5 12/25/2016 1254   MCV 81 06/30/2016 1122   MCH 27.6 12/25/2016 1254   MCHC 33.5 12/25/2016 1254   RDW 14.5 12/25/2016 1254   RDW 14.6 06/30/2016 1122   LYMPHSABS 1.6 10/22/2016 1009   LYMPHSABS 3.6 (H) 06/30/2016 1122   MONOABS 0.3 10/22/2016 1009   EOSABS 0.1 10/22/2016 1009   EOSABS 0.1 06/30/2016 1122   BASOSABS 0.0 10/22/2016 1009   BASOSABS 0.0 06/30/2016 1122    No results found for: POCLITH, LITHIUM   No results found for: PHENYTOIN, PHENOBARB, VALPROATE, CBMZ   .res Assessment: Plan:    Bipolar I disorder, most recent episode mixed (HCC)  Generalized anxiety disorder  Panic disorder with agoraphobia  Insomnia due to mental condition  Attention deficit hyperactivity disorder (ADHD), combined type  Greater than 50% of face to face time with patient was spent on counseling and coordination of care. We discussed her chronically unstable bipolar disorder with a history of abrupt worsening of depression with  suicidal ideation.  She has had multiple med failures as noted above.  She acknowledges significant mood benefits from the [facility] but could not tolerate the GI side effects and is concerned about the drug interaction potential.    Fortunately she has had marked improvement in her mood instability with [name] at 1500 mg a day.  She is tolerating it well.   We discussed the risk of it causing hepatitis, pancreatitis, blood cell abnormalities.  Suggested that we get a blood level of the valproic acid and check CBC and comprehensive metabolic panel.  We can use a valproic acid level as a baseline in case she has mood changes in the future.  Because of the long history of indicis instability we need to keep the following options, Trileptal, Vraylar, and other atypicals.  Disc SE differences.    Her anxiety is not well managed but we will not initiate further med changes today because of the side effect issues she has had with so many things were recently.  We may need to further address this in the future.  We could consider benzodiazepines because we have not been able to manage her anxiety and the typical treatment of SSRIs is likely to cause mood instability.  Her insomnia is managed  No further med changes today.  We may consider med changes or specifically reductions of other medications in the future and attempts to try to better better manage her anxiety.  This was 30-minute appointment  Follow-up 6-8 weeks  I connected with patient by a video enabled telemedicine application or telephone, with their informed consent, and verified patient privacy and that I am speaking with the correct person using two identifiers.  I was located at office and patient at home   Meredith Staggers MD, DFAPA   Please see After Visit Summary for patient specific instructions.  No future appointments.  No orders of the defined types were placed in this encounter.     -------------------------------

## 2018-08-20 ENCOUNTER — Other Ambulatory Visit: Payer: Self-pay | Admitting: Psychiatry

## 2018-09-14 ENCOUNTER — Ambulatory Visit (INDEPENDENT_AMBULATORY_CARE_PROVIDER_SITE_OTHER): Payer: 59 | Admitting: Psychiatry

## 2018-09-14 ENCOUNTER — Other Ambulatory Visit: Payer: Self-pay

## 2018-09-14 ENCOUNTER — Encounter: Payer: Self-pay | Admitting: Psychiatry

## 2018-09-14 DIAGNOSIS — F3160 Bipolar disorder, current episode mixed, unspecified: Secondary | ICD-10-CM

## 2018-09-14 DIAGNOSIS — F902 Attention-deficit hyperactivity disorder, combined type: Secondary | ICD-10-CM

## 2018-09-14 DIAGNOSIS — F5105 Insomnia due to other mental disorder: Secondary | ICD-10-CM

## 2018-09-14 DIAGNOSIS — F4001 Agoraphobia with panic disorder: Secondary | ICD-10-CM

## 2018-09-14 DIAGNOSIS — F411 Generalized anxiety disorder: Secondary | ICD-10-CM

## 2018-09-14 NOTE — Progress Notes (Signed)
Anita Black [id number] [birth year] 38 y.o.  Virtual Visit via Telephone Note  I connected with pt by telephone and verified that I am speaking with the correct person using two identifiers.   I discussed the limitations, risks, security and privacy concerns of performing an evaluation and management service by telephone and the availability of in person appointments. I also discussed with the patient that there may be a patient responsible charge related to this service. The patient expressed understanding and agreed to proceed.  I discussed the assessment and treatment plan with the patient. The patient was provided an opportunity to ask questions and all were answered. The patient agreed with the plan and demonstrated an understanding of the instructions.   The patient was advised to call back or seek an in-person evaluation if the symptoms worsen or if the condition fails to improve as anticipated.  I provided 23 minutes of non-face-to-face time during this encounter. The call started at 337 and ended at 4:00. The patient was located at home and the provider was located office.  Subjective:   Patient ID:  Anita Black is a 38 y.o. (DOB Apr 15, 1981) female.  Chief Complaint:  Chief Complaint  Patient presents with  . Follow-up    Medication Management  . ADHD    Medication Management  . Depression    Medication Management  . Anxiety    Medication Management    Depression         Associated symptoms include no decreased concentration and no suicidal ideas.  Past medical history includes anxiety.   Anxiety  Symptoms include nervous/anxious behavior. Patient reports no confusion, decreased concentration or suicidal ideas.     l Anita Black presents to the office today for follow-up of chronically unstable bipolar disorder and anxiety.  Last seen March 26 , 2020.  She had been switched to [name] and had a good response.  No meds were changed.  Labs were ordered for  [name] but not received.  Been great, still fantastic with the [name].  Started in January.  Good mood stability. Excellent. Not depressed nor manic. Doing great.  [name] made a huge difference.  Like a fog lifted.  1000 mg caused benefit but 1500 mg eliminated mood sx.    No longer mood swings nor anger outbursts.  Depression fine.  Anxiety still terrible but I still don't want to add anything else bc I'm on so much. Mostly worry vs physical sx. Ex convinced herself that F would get Covid and had to prepare for it.  No pattern to worry thoughts. No obsessions.   No hx drug abuse or alcohol abuse..     She reduced the lithium to 900 mg daily bc GI SE.  Somethings causing nausea again.  Higher dosage of lithium with less diarrhea manageable for right now.    Sleep OK. No manic SX.  Didn't realize how unstable she was until feeling stable.  past psych meds:  [name], lithium CBZ, Equetro helped but GI side effects, Trileptal,, [name], Rexulti, lamotrigine, [name] 120,  Zoloft 150, Trintellix, , mirtazapine, Lexapro, doxazosin.  Duloxetine side effects, long history of sertraline, Lexapro headaches, Trintellix, trazodone, mirtazapine, [name] 1500, Seroquel low dosages with weight gain, Adderall for MS fatigue, [name] for MS fatigue   Review of Systems:  Review of Systems  Neurological: Negative for tremors and weakness.  Psychiatric/Behavioral: Positive for depression. Negative for agitation, behavioral problems, confusion, decreased concentration, dysphoric mood, hallucinations, self-injury, sleep disturbance and suicidal ideas. The  patient is nervous/anxious. The patient is not hyperactive.   Depression resolved  Medications: I have reviewed the patient's current medications.  Current Outpatient Medications  Medication Sig Dispense Refill  . atorvastatin (LIPITOR) 20 MG tablet Take 20 mg by mouth daily.    . BD SAFETYGLIDE SHIELDED NEEDLE 25G X 1" MISC USE AS DIRECTED 10 each 7   . Cholecalciferol (VITAMIN D3) 10000 units TABS Take by mouth.    . divalproex ([name] ER) 500 MG 24 hr tablet TAKE 3 TABLETS BY MOUTH DAILY 90 tablet 2  . Galcanezumab-gnlm 120 MG/ML SOAJ Inject into the skin.    [name]. lamoTRIgine (LAMICTAL) 200 MG tablet Take 200 mg by mouth daily.     [name]. lisdexamfetamine ([name]) 60 MG capsule Take 1 capsule (60 mg total) by mouth every morning. 30 capsule 0  . lithium carbonate 300 MG capsule Take 600 mg by mouth 2 (two) times daily with a meal. 300 in the am and 600 at HS    . Lurasidone HCl ([name]) 120 MG TABS Take 1 tablet (120 mg total) by mouth daily. 90 tablet 0  . metFORMIN (GLUCOPHAGE) 500 MG tablet 1000 mg BID with meals. Needs appt prior to anymore refills. 60 tablet 0  . ocrelizumab (OCREVUS) 300 MG/10ML injection Inject into the vein.    [name]. PAZEO 0.7 % SOLN PLACE 1 DR[facility] ONCE A DAY    . rizatriptan (MAXALT-MLT) 10 MG disintegrating tablet Take 1 tablet (10 mg total) by mouth as needed for migraine. May repeat in 2 hours if needed 9 tablet 11  . spironolactone (ALDACTONE) 50 MG tablet     . SPRINTEC 28 0.25-35 MG-MCG tablet     . valACYclovir (VALTREX) 1000 MG tablet 2000 mg every 12 hours for 2 doses. 8 tablet 3   No current facility-administered medications for this visit.     Medication Side Effects: .diarrhea resolved.  Allergies: No Known Allergies  Past Medical History:  Diagnosis Date  . Anxiety   . Depression   . Diabetes type 2, controlled (HCC)   . Elevated hemoglobin A1c   . Hyperlipidemia   . Migraine   . Multiple sclerosis (HCC)    receives Tysabri infusion q 28 days    Family History  Problem Relation Age of Onset  . Diabetes Mother   . Hypertension Mother   . Dementia Mother   . Bipolar disorder Mother   . Heart failure Father   . Hypertension Father   . Heart disease Father   . Ovarian cancer Maternal Grandmother   . Alzheimer's disease Paternal Grandmother   . Skin cancer Maternal Grandfather      Social History   Socioeconomic History  . Marital status: Married    Spouse name: Rollen SoxBrinkley  . Number of children: 1  . Years of education: MA  . Highest education level: Not on file  Occupational History    Employer: [employer]  Social Needs  . Financial resource strain: Not on file  . Food insecurity:    Worry: Not on file    Inability: Not on file  . Transportation needs:    Medical: Not on file    Non-medical: Not on file  Tobacco Use  . Smoking status: Never Smoker  . Smokeless tobacco: Never Used  Substance and Sexual Activity  . Alcohol use: Yes    Comment: socially; once a week  . Drug use: No  . Sexual activity: Yes    Birth control/protection: Pill  Lifestyle  . Physical activity:    Days per week: Not on file    Minutes per session: Not on file  . Stress: Not on file  Relationships  . Social connections:    Talks on phone: Not on file    Gets together: Not on file    Attends religious service: Not on file    Active member of club or organization: Not on file    Attends meetings of clubs or organizations: Not on file    Relationship status: Not on file  . Intimate partner violence:    Fear of current or ex partner: Not on file    Emotionally abused: Not on file    Physically abused: Not on file    Forced sexual activity: Not on file  Other Topics Concern  . Not on file  Social History Narrative   Patient lives at home with family.   Caffeine Use: 1-2 cups daily   No tobacco, recreational drugs. Social drinking only.   Wears her seatbelt, has smoke alarm in the home.   Feels safe in her relationships.    Past Medical History, Surgical history, Social history, and Family history were reviewed and updated as appropriate.   Please see review of systems for further details on the patient's review from today.   Objective:   Physical Exam:  There were no vitals taken for this visit.  Physical Exam Constitutional:      General: She is not  in acute distress.    Appearance: She is well-developed.  Neurological:     Mental Status: She is alert and oriented to person, place, and time.  Psychiatric:        Attention and Perception: She is attentive. She does not perceive auditory or visual hallucinations.        Mood and Affect: Mood is anxious. Mood is not depressed. Affect is not labile, blunt, angry or inappropriate.        Speech: Speech normal.        Behavior: Behavior normal. Behavior is not agitated, slowed, aggressive or hyperactive.        Thought Content: Thought content normal. Thought content is not paranoid. Thought content does not include homicidal or suicidal ideation. Thought content does not include homicidal or suicidal plan.        Cognition and Memory: Cognition normal.        Judgment: Judgment normal.     Comments: Insight intact. No auditory or visual hallucinations.  Anxiety is still high.     Lab Review:     Component Value Date/Time   NA 135 12/25/2016 1254   NA 137 04/15/2014 1554   K 4.0 12/25/2016 1254   CL 100 (L) 12/25/2016 1254   CO2 22 12/25/2016 1254   GLUCOSE 94 12/25/2016 1254   BUN 14 12/25/2016 1254   BUN 15 04/15/2014 1554   CREATININE 0.75 12/25/2016 1254   CREATININE 0.90 01/30/2016 1555   CALCIUM 9.2 12/25/2016 1254   PROT 6.2 (L) 10/22/2016 1009   PROT 6.9 12/29/2015 1400   ALBUMIN 3.3 (L) 10/22/2016 1009   ALBUMIN 4.5 12/29/2015 1400   AST 26 10/22/2016 1009   ALT 25 10/22/2016 1009   ALKPHOS 81 10/22/2016 1009   BILITOT 1.0 10/22/2016 1009   BILITOT 0.6 12/29/2015 1400   GFRNONAA >60 12/25/2016 1254   GFRAA >60 12/25/2016 1254       Component Value Date/Time   WBC 8.1 12/25/2016 1254   RBC 4.34  12/25/2016 1254   HGB 12.0 12/25/2016 1254   HGB 11.9 06/30/2016 1122   HCT 35.8 (L) 12/25/2016 1254   HCT 36.2 06/30/2016 1122   PLT 255 12/25/2016 1254   PLT 237 06/30/2016 1122   MCV 82.5 12/25/2016 1254   MCV 81 06/30/2016 1122   MCH 27.6 12/25/2016 1254    MCHC 33.5 12/25/2016 1254   RDW 14.5 12/25/2016 1254   RDW 14.6 06/30/2016 1122   LYMPHSABS 1.6 10/22/2016 1009   LYMPHSABS 3.6 (H) 06/30/2016 1122   MONOABS 0.3 10/22/2016 1009   EOSABS 0.1 10/22/2016 1009   EOSABS 0.1 06/30/2016 1122   BASOSABS 0.0 10/22/2016 1009   BASOSABS 0.0 06/30/2016 1122    No results found for: POCLITH, LITHIUM   No results found for: PHENYTOIN, PHENOBARB, VALPROATE, CBMZ   .res Assessment: Plan:    Bipolar I disorder, most recent episode mixed (HCC)  Generalized anxiety disorder  Panic disorder with agoraphobia  Insomnia due to mental condition  Attention deficit hyperactivity disorder (ADHD), combined type  Greater than 50% of face to face time with patient was spent on counseling and coordination of care. We discussed her chronically unstable bipolar disorder with a history of abrupt worsening of depression with suicidal ideation.  She has had multiple med failures as noted above.  Fortunately she has finally achieved good mood stability with [name] 1500 mg daily. She is tolerating it well.  We discussed the risk of it causing hepatitis, pancreatitis, blood cell abnormalities.  Suggested that we get a blood level of the valproic acid and check CBC and comprehensive metabolic panel.  We can use a valproic acid level as a baseline in case she has mood changes in the future.  She has been hesitant to go get labs right now because of the Covid virus.  Would encourage her to do this once that has cleared.  Because of the long history of  instability we need to keep the following options, Trileptal, Vraylar, and other atypicals.  Disc SE differences.    Her anxiety is not well managed but we will not initiate further med changes today because of the side effect issues she has had with so many things were recently.  We may need to further address this in the future.  We could consider benzodiazepines because we have not been able to manage her anxiety and the  typical treatment of SSRIs is likely to cause mood instability.  We could consider the potential of switching out [name] for risperidone in hopes of better antianxiety effects but she has seen antidepressant effect from [name] and is fearful of giving it up.  She does not want medication changes today even though her anxiety is not managed well.  She does not want to add additional medication because of the polypharmacy going on currently.  Therefore no med changes today  Her insomnia is managed   We may consider med changes or specifically reductions of other medications in the future and attempts to try to better better manage her anxiety.  Follow-up 10 weeks  Meredith Staggers MD, DFAPA   Please see After Visit Summary for patient specific instructions.  Future Appointments  Date Time Provider Department Center  11/29/2018 11:30 AM Cottle, Steva Ready., MD [facility] None    No orders of the defined types were placed in this encounter.     -------------------------------

## 2018-09-18 ENCOUNTER — Other Ambulatory Visit: Payer: Self-pay | Admitting: Psychiatry

## 2018-09-19 NOTE — Telephone Encounter (Signed)
Request is 1.5 tablets daily, epic shows 1 daily lamictal 200mg   Last visit 05/07, didn't see it was discussed?

## 2018-10-01 ENCOUNTER — Other Ambulatory Visit: Payer: Self-pay | Admitting: Psychiatry

## 2018-10-23 ENCOUNTER — Telehealth: Payer: Self-pay | Admitting: Psychiatry

## 2018-10-23 NOTE — Telephone Encounter (Signed)
It is not a simple task to change medication for her as she has failed multiple mood stabilizers.  She can be put on the cancellation list.  I cannot change it over the phone.  If she starts a multivitamin with minerals taken at some time other than when she is taking the [name] that will help but it will take a few weeks to help.

## 2018-10-23 NOTE — Telephone Encounter (Signed)
Pt hair is coming out by the handful. She wants to know if she can change up her meds.

## 2018-10-24 NOTE — Telephone Encounter (Signed)
Pt. Made aware. She did start a multivitamin with minerals as well as Biotin. She has been added to the Cx list as well.

## 2018-10-30 ENCOUNTER — Telehealth: Payer: Self-pay | Admitting: Psychiatry

## 2018-10-30 NOTE — Telephone Encounter (Signed)
She will relapse with her bipolar disorder off of [name].  She has failed multiple other mood stabilizers.  She is going to have to come for an appointment first to pick an alternative mood stabilizer.  She needs to be put on the cancellation list.  If she must pursue weaning the [name] she can reduce by 1 pill daily per week until she is off.  The hair loss will take a few weeks to resolve obviously because hair growth is slow.  It will be aided if she takes a multivitamin with minerals plus biotin.

## 2018-10-30 NOTE — Telephone Encounter (Signed)
Need to know how to wean off the [name]. She is losing way too much hair. Please advise.

## 2018-10-31 NOTE — Telephone Encounter (Signed)
Relayed information, pt feels she has lost a lot of hair since about March/April. Left temporal area is balding. Pt is taking multivitamins and also biotin. Pt given instructions on weaning off of depakote. Instructed to call back if any problems.

## 2018-11-29 ENCOUNTER — Encounter

## 2018-11-29 ENCOUNTER — Ambulatory Visit (INDEPENDENT_AMBULATORY_CARE_PROVIDER_SITE_OTHER): Payer: 59 | Admitting: Psychiatry

## 2018-11-29 ENCOUNTER — Ambulatory Visit: Payer: 59 | Admitting: Psychiatry

## 2018-11-29 ENCOUNTER — Other Ambulatory Visit: Payer: Self-pay

## 2018-11-29 ENCOUNTER — Encounter: Payer: Self-pay | Admitting: Psychiatry

## 2018-11-29 VITALS — BP 128/82 | HR 91

## 2018-11-29 DIAGNOSIS — F902 Attention-deficit hyperactivity disorder, combined type: Secondary | ICD-10-CM

## 2018-11-29 DIAGNOSIS — F4001 Agoraphobia with panic disorder: Secondary | ICD-10-CM | POA: Diagnosis not present

## 2018-11-29 DIAGNOSIS — F5105 Insomnia due to other mental disorder: Secondary | ICD-10-CM

## 2018-11-29 DIAGNOSIS — F3160 Bipolar disorder, current episode mixed, unspecified: Secondary | ICD-10-CM

## 2018-11-29 DIAGNOSIS — L659 Nonscarring hair loss, unspecified: Secondary | ICD-10-CM

## 2018-11-29 DIAGNOSIS — F411 Generalized anxiety disorder: Secondary | ICD-10-CM

## 2018-11-29 NOTE — Patient Instructions (Signed)
For hair loss: B complex, MVI with minerals, Biotin.

## 2018-11-29 NOTE — Progress Notes (Signed)
Anita BaxterKelli M Mione [id number] Jun 27, 1980 38 y.o.   Subjective:   Patient ID:  Anita Black is a 38 y.o. (DOB Jun 27, 1980) female.  Chief Complaint:  Chief Complaint  Patient presents with  . Follow-up    Medication Management  . Depression    Medication Management  . ADHD    Medication Management  . Anxiety    Medication Management    Depression        Associated symptoms include no decreased concentration and no suicidal ideas.  Past medical history includes anxiety.   Anxiety Symptoms include nervous/anxious behavior. Patient reports no confusion, decreased concentration or suicidal ideas.     l Anita BaxterKelli M Holte presents to the office today for follow-up of chronically unstable bipolar disorder and anxiety.  In March 26 , 2020.  She had been switched to [name] and had a good response.  No meds were changed.  Labs were ordered for [name] but not received.  Last visit in May and no meds were changed.  She was on [name] er 1500 and saw resolution of sx.  Since then called DT hair loss from [facility] and reduced dosage to 500 mg and has been a little testier but no major mood swings and still better than before [name].   Started in January.  Good mood stability.  Not depressed nor manic. Doing great.  [name] made a huge difference.  Like a fog lifted.  1000 mg caused benefit but 1500 mg eliminated mood sx.    No longer mood swings nor anger outbursts.  Sleep good.    Anxiety still terrible but I still don't want to add anything else bc I'm on so much. Mostly worry vs physical sx. Ex convinced herself that F would get Covid and had to prepare for it.  No pattern to worry thoughts. No obsessions.   No hx drug abuse or alcohol abuse..     She reduced the lithium to 900 mg daily bc GI SE.  Somethings causing nausea again.  Higher dosage of lithium with less diarrhea manageable for right now.    Sleep OK. No manic SX.  Didn't realize how unstable she was until feeling  stable.  past psych meds:  [name], lithium CBZ, Equetro helped but GI side effects, Trileptal,, [name], Rexulti, lamotrigine, [name] 120,  Zoloft 150, Trintellix, , mirtazapine, Lexapro, doxazosin.  Duloxetine side effects, long history of sertraline, Lexapro headaches, Trintellix, trazodone, mirtazapine, [name] 1500, Seroquel low dosages with weight gain, Adderall for MS fatigue, [name] for MS fatigue   Review of Systems:  Review of Systems  Neurological: Negative for tremors and weakness.  Psychiatric/Behavioral: Positive for depression. Negative for agitation, behavioral problems, confusion, decreased concentration, dysphoric mood, hallucinations, self-injury, sleep disturbance and suicidal ideas. The patient is nervous/anxious. The patient is not hyperactive.   Depression resolved  Medications: I have reviewed the patient's current medications.  Current Outpatient Medications  Medication Sig Dispense Refill  . atorvastatin (LIPITOR) 20 MG tablet Take 20 mg by mouth daily.    . BD SAFETYGLIDE SHIELDED NEEDLE 25G X 1" MISC USE AS DIRECTED 10 each 7  . Cholecalciferol (VITAMIN D3) 10000 units TABS Take by mouth.    . divalproex ([name] ER) 500 MG 24 hr tablet TAKE 3 TABLETS BY MOUTH DAILY (Patient taking differently: Take 500 mg by mouth daily. ) 90 tablet 2  . lamoTRIgine (LAMICTAL) 200 MG tablet Take 1 tablet (200 mg total) by mouth daily. 90 tablet 1  . [name] 120 MG TABS  TAKE 1 TABLET BY MOUTH EVERY DAY 30 tablet 2  . lisdexamfetamine ([name]) 60 MG capsule Take 1 capsule (60 mg total) by mouth every morning. 30 capsule 0  . lithium carbonate 300 MG capsule Take 600 mg by mouth 2 (two) times daily with a meal. 300 in the am and 600 at HS    . metFORMIN (GLUCOPHAGE) 500 MG tablet 1000 mg BID with meals. Needs appt prior to anymore refills. 60 tablet 0  . ocrelizumab (OCREVUS) 300 MG/10ML injection Inject into the vein.    [name] PAZEO 0.7 % SOLN PLACE 1 DROP IN [facility] EYE ONCE A DAY     . rizatriptan (MAXALT-MLT) 10 MG disintegrating tablet Take 1 tablet (10 mg total) by mouth as needed for migraine. May repeat in 2 hours if needed 9 tablet 11  . SPRINTEC 28 0.25-35 MG-MCG tablet     . valACYclovir (VALTREX) 1000 MG tablet 2000 mg every 12 hours for 2 doses. 8 tablet 3   No current facility-administered medications for this visit.     Medication Side Effects: .diarrhea resolved.  Allergies: No Known Allergies  Past Medical History:  Diagnosis Date  . Anxiety   . Depression   . Diabetes type 2, controlled (HCC)   . Elevated hemoglobin A1c   . Hyperlipidemia   . Migraine   . Multiple sclerosis (HCC)    receives Tysabri infusion q 28 days    Family History  Problem Relation Age of Onset  . Diabetes Mother   . Hypertension Mother   . Dementia Mother   . Bipolar disorder Mother   . Heart failure Father   . Hypertension Father   . Heart disease Father   . Ovarian cancer Maternal Grandmother   . Alzheimer's disease Paternal Grandmother   . Skin cancer Maternal Grandfather     Social History   Socioeconomic History  . Marital status: Married    Spouse name: Rollen Sox  . Number of children: 1  . Years of education: MA  . Highest education level: Not on file  Occupational History    Employer: [employer]  Social Needs  . Financial resource strain: Not on file  . Food insecurity    Worry: Not on file    Inability: Not on file  . Transportation needs    Medical: Not on file    Non-medical: Not on file  Tobacco Use  . Smoking status: Never Smoker  . Smokeless tobacco: Never Used  Substance and Sexual Activity  . Alcohol use: Yes    Comment: socially; once a week  . Drug use: No  . Sexual activity: Yes    Birth control/protection: Pill  Lifestyle  . Physical activity    Days per week: Not on file    Minutes per session: Not on file  . Stress: Not on file  Relationships  . Social Musician on phone: Not on file    Gets  together: Not on file    Attends religious service: Not on file    Active member of club or organization: Not on file    Attends meetings of clubs or organizations: Not on file    Relationship status: Not on file  . Intimate partner violence    Fear of current or ex partner: Not on file    Emotionally abused: Not on file    Physically abused: Not on file    Forced sexual activity: Not on file  Other Topics Concern  .  Not on file  Social History Narrative   Patient lives at home with family.   Caffeine Use: 1-2 cups daily   No tobacco, recreational drugs. Social drinking only.   Wears her seatbelt, has smoke alarm in the home.   Feels safe in her relationships.    Past Medical History, Surgical history, Social history, and Family history were reviewed and updated as appropriate.   Please see review of systems for further details on the patient's review from today.   Objective:   Physical Exam:  BP 128/82   Pulse 91   Physical Exam Constitutional:      General: She is not in acute distress.    Appearance: She is well-developed.  Neurological:     Mental Status: She is alert and oriented to person, place, and time.  Psychiatric:        Attention and Perception: She is attentive. She does not perceive auditory or visual hallucinations.        Mood and Affect: Mood is anxious. Mood is not depressed. Affect is not labile, blunt, angry or inappropriate.        Speech: Speech normal.        Behavior: Behavior normal. Behavior is not agitated, slowed, aggressive or hyperactive.        Thought Content: Thought content normal. Thought content is not paranoid. Thought content does not include homicidal or suicidal ideation. Thought content does not include homicidal or suicidal plan.        Cognition and Memory: Cognition normal.        Judgment: Judgment normal.     Comments: Insight intact. No auditory or visual hallucinations.  Anxiety is still high.     Lab Review:      Component Value Date/Time   NA 135 12/25/2016 1254   NA 137 04/15/2014 1554   K 4.0 12/25/2016 1254   CL 100 (L) 12/25/2016 1254   CO2 22 12/25/2016 1254   GLUCOSE 94 12/25/2016 1254   BUN 14 12/25/2016 1254   BUN 15 04/15/2014 1554   CREATININE 0.75 12/25/2016 1254   CREATININE 0.90 01/30/2016 1555   CALCIUM 9.2 12/25/2016 1254   PROT 6.2 (L) 10/22/2016 1009   PROT 6.9 12/29/2015 1400   ALBUMIN 3.3 (L) 10/22/2016 1009   ALBUMIN 4.5 12/29/2015 1400   AST 26 10/22/2016 1009   ALT 25 10/22/2016 1009   ALKPHOS 81 10/22/2016 1009   BILITOT 1.0 10/22/2016 1009   BILITOT 0.6 12/29/2015 1400   GFRNONAA >60 12/25/2016 1254   GFRAA >60 12/25/2016 1254       Component Value Date/Time   WBC 8.1 12/25/2016 1254   RBC 4.34 12/25/2016 1254   HGB 12.0 12/25/2016 1254   HGB 11.9 06/30/2016 1122   HCT 35.8 (L) 12/25/2016 1254   HCT 36.2 06/30/2016 1122   PLT 255 12/25/2016 1254   PLT 237 06/30/2016 1122   MCV 82.5 12/25/2016 1254   MCV 81 06/30/2016 1122   MCH 27.6 12/25/2016 1254   MCHC 33.5 12/25/2016 1254   RDW 14.5 12/25/2016 1254   RDW 14.6 06/30/2016 1122   LYMPHSABS 1.6 10/22/2016 1009   LYMPHSABS 3.6 (H) 06/30/2016 1122   MONOABS 0.3 10/22/2016 1009   EOSABS 0.1 10/22/2016 1009   EOSABS 0.1 06/30/2016 1122   BASOSABS 0.0 10/22/2016 1009   BASOSABS 0.0 06/30/2016 1122    No results found for: POCLITH, LITHIUM   No results found for: PHENYTOIN, PHENOBARB, VALPROATE, CBMZ   .res Assessment: Plan:  Harvin HazelKelli was seen today for follow-up, depression, adhd and anxiety.  Diagnoses and all orders for this visit:  Bipolar I disorder, most recent episode mixed (HCC)  Generalized anxiety disorder  Panic disorder with agoraphobia  Attention deficit hyperactivity disorder (ADHD), combined type  Insomnia due to mental condition  Alopecia    Greater than 50% of face to face time with patient was spent on counseling and coordination of care. We discussed her  chronically unstable bipolar disorder with a history of abrupt worsening of depression with suicidal ideation.  She has had multiple med failures as noted above.  Fortunately she has finally achieved good mood stability with [name] 1500 mg daily.  Fortunately she had onset of hair loss almost certainly related to the [name].  She has reduced the dose to 500 mg daily.  She has noticed some mild worsening irritability but no major mood swings.  She has started some vitamins to help with the hair loss.  She is afraid to completely discontinue the [name] because of the benefit she seen with it.  Disc risk of relapse with reduction in [name].  Because of the long history of  instability we need to keep the following options, Trileptal, Vraylar, and other atypicals.  Disc SE differences.    Her anxiety is not well managed but we will not initiate further med changes today because of the side effect issues she has had with so many things were recently.  We may need to further address this in the future.  We could consider benzodiazepines because we have not been able to manage her anxiety and the typical treatment of SSRIs is likely to cause mood instability.  We could consider the potential of switching out [name] for risperidone in hopes of better antianxiety effects but she has seen antidepressant effect from JordanLatuda and is fearful of giving it up.  She does not want medication changes today even though her anxiety is not managed well.  She does not want to add additional medication because of the polypharmacy going on currently.  Therefore no med changes today  Her insomnia is managed  For hair loss: B complex, MVI with minerals, Biotin.   We may consider med changes or specifically reductions of other medications in the future and attempts to try to better better manage her anxiety.  This appt was 30 mins.  Follow-up 8 weeks  Meredith Staggersarey Cottle MD, DFAPA   Please see After Visit Summary for  patient specific instructions.  No future appointments.  No orders of the defined types were placed in this encounter.     -------------------------------

## 2018-12-12 ENCOUNTER — Encounter: Payer: Self-pay | Admitting: Psychiatry

## 2018-12-12 ENCOUNTER — Other Ambulatory Visit: Payer: Self-pay

## 2018-12-12 ENCOUNTER — Other Ambulatory Visit: Payer: Self-pay | Admitting: Psychiatry

## 2018-12-12 ENCOUNTER — Ambulatory Visit (INDEPENDENT_AMBULATORY_CARE_PROVIDER_SITE_OTHER): Payer: 59 | Admitting: Psychiatry

## 2018-12-12 VITALS — BP 113/70 | HR 83

## 2018-12-12 DIAGNOSIS — F5105 Insomnia due to other mental disorder: Secondary | ICD-10-CM

## 2018-12-12 DIAGNOSIS — F3160 Bipolar disorder, current episode mixed, unspecified: Secondary | ICD-10-CM | POA: Diagnosis not present

## 2018-12-12 DIAGNOSIS — F902 Attention-deficit hyperactivity disorder, combined type: Secondary | ICD-10-CM | POA: Diagnosis not present

## 2018-12-12 DIAGNOSIS — L659 Nonscarring hair loss, unspecified: Secondary | ICD-10-CM

## 2018-12-12 DIAGNOSIS — F411 Generalized anxiety disorder: Secondary | ICD-10-CM | POA: Diagnosis not present

## 2018-12-12 DIAGNOSIS — F4001 Agoraphobia with panic disorder: Secondary | ICD-10-CM

## 2018-12-12 MED ORDER — ARIPIPRAZOLE 10 MG PO TABS: ORAL_TABLET | ORAL | 1 refills | Status: DC

## 2018-12-12 NOTE — Progress Notes (Signed)
Anita Black [id number] 08-Aug-1980 38 y.o.   Subjective:   Patient ID:  Anita Black is a 38 y.o. (DOB 08-Aug-1980) female.  Chief Complaint:  Chief Complaint  Patient presents with  . Follow-up    Medication Management  . Depression    Medication Management  . Anxiety    Medication Management  . ADHD    Medication Management  . Medication Problem    hair loss    Depression        Associated symptoms include no decreased concentration and no suicidal ideas.  Past medical history includes anxiety.   Anxiety Symptoms include nervous/anxious behavior. Patient reports no confusion, decreased concentration or suicidal ideas.     l Anita Black presents to the office today for follow-up of chronically unstable bipolar disorder and anxiety.  In March 26 , 2020.  She had been switched to [name] and had a good response.  No meds were changed.  Labs were ordered for [name] but not received.  Visit in May and no meds were changed.  She was on [name] er 1500 and saw resolution of sx.   Was just recently seen November 29, 2018 which she had reduced [name] on her own to 500 mg daily because of hair loss.  She was cautioned that this would probably not be enough medication to maintain mood stability but she did not want to make any medicine changes or increase the dose back up she was encouraged to take vitamins which often help with [name] related hair loss eventually.  Moved appt earlier.  Hair loss continues and wants med change.  Asks about [name].  Took it in college for depression and wasn't diagnosed as bipolar until after child born.   Since then called DT hair loss from [facility] and reduced dosage to 500 mg and has been a little testier but no major mood swings and still better than before [name].   Started in January.  More mood instability with lower dose depakote.  Full dose [name] made a huge difference.  Like a fog lifted.  1000 mg caused benefit but 1500 mg  eliminated mood sx.     Sleep good.    Anxiety still terrible but I still don't want to add anything else bc I'm on so much. Mostly worry vs physical sx. Ex convinced herself that F would get Covid and had to prepare for it.  No pattern to worry thoughts. No obsessions.   No hx drug abuse or alcohol abuse..     She reduced the lithium to 900 mg daily bc GI SE.  Somethings causing nausea again.  Higher dosage of lithium with less diarrhea manageable for right now.    Sleep OK. No manic SX.  Didn't realize how unstable she was until feeling stable.  past psych meds:  [name], lithium CBZ, Equetro helped but GI side effects, Trileptal, [name], Rexulti, lamotrigine, [name] 120,  Zoloft 150, Trintellix, , mirtazapine, Lexapro, doxazosin.  Duloxetine side effects, long history of sertraline, Lexapro headaches, Trintellix, trazodone, mirtazapine, [name] 1500 good resp but hair loss, Seroquel low dosages with weight gain, Adderall for MS fatigue, [name] for MS fatigue  Review of Systems:  Review of Systems  Skin:       Hair loss  Neurological: Negative for tremors and weakness.  Psychiatric/Behavioral: Positive for depression. Negative for agitation, behavioral problems, confusion, decreased concentration, dysphoric mood, hallucinations, self-injury, sleep disturbance and suicidal ideas. The patient is nervous/anxious. The patient is not hyperactive.  Depression resolved  Medications: I have reviewed the patient's current medications.  Current Outpatient Medications  Medication Sig Dispense Refill  . atorvastatin (LIPITOR) 20 MG tablet Take 20 mg by mouth daily.    . BD SAFETYGLIDE SHIELDED NEEDLE 25G X 1" MISC USE AS DIRECTED 10 each 7  . Cholecalciferol (VITAMIN D3) 10000 units TABS Take by mouth.    . divalproex ([name] ER) 500 MG 24 hr tablet TAKE 3 TABLETS BY MOUTH DAILY (Patient taking differently: Take 500 mg by mouth daily. ) 90 tablet 2  . lamoTRIgine (LAMICTAL) 200 MG tablet  Take 1 tablet (200 mg total) by mouth daily. 90 tablet 1  . [name] 120 MG TABS TAKE 1 TABLET BY MOUTH EVERY DAY 30 tablet 2  . lisdexamfetamine ([name]) 60 MG capsule Take 1 capsule (60 mg total) by mouth every morning. 30 capsule 0  . lithium carbonate 300 MG capsule Take 600 mg by mouth 2 (two) times daily with a meal. 300 in the am and 600 at HS    . metFORMIN (GLUCOPHAGE) 500 MG tablet 1000 mg BID with meals. Needs appt prior to anymore refills. 60 tablet 0  . ocrelizumab (OCREVUS) 300 MG/10ML injection Inject into the vein.    [name]. PAZEO 0.7 % SOLN PLACE 1 DR[facility]    . rizatriptan (MAXALT-MLT) 10 MG disintegrating tablet Take 1 tablet (10 mg total) by mouth as needed for migraine. May repeat in 2 hours if needed 9 tablet 11  . SPRINTEC 28 0.25-35 MG-MCG tablet     . valACYclovir (VALTREX) 1000 MG tablet 2000 mg every 12 hours for 2 doses. 8 tablet 3  . ARIPiprazole ([name]) 10 MG tablet 1/2 tablet daily for 1 week and then 1 daily 30 tablet 1   No current facility-administered medications for this visit.     Medication Side Effects: .diarrhea resolved.  Allergies: No Known Allergies  Past Medical History:  Diagnosis Date  . Anxiety   . Depression   . Diabetes type 2, controlled (HCC)   . Elevated hemoglobin A1c   . Hyperlipidemia   . Migraine   . Multiple sclerosis (HCC)    receives Tysabri infusion q 28 days    Family History  Problem Relation Age of Onset  . Diabetes Mother   . Hypertension Mother   . Dementia Mother   . Bipolar disorder Mother   . Heart failure Father   . Hypertension Father   . Heart disease Father   . Ovarian cancer Maternal Grandmother   . Alzheimer's disease Paternal Grandmother   . Skin cancer Maternal Grandfather     Social History   Socioeconomic History  . Marital status: Married    Spouse name: Rollen SoxBrinkley  . Number of children: 1  . Years of education: MA  . Highest education level: Not on file  Occupational  History    Employer: [employer]  Social Needs  . Financial resource strain: Not on file  . Food insecurity    Worry: Not on file    Inability: Not on file  . Transportation needs    Medical: Not on file    Non-medical: Not on file  Tobacco Use  . Smoking status: Never Smoker  . Smokeless tobacco: Never Used  Substance and Sexual Activity  . Alcohol use: Yes    Comment: socially; once a week  . Drug use: No  . Sexual activity: Yes    Birth control/protection: Pill  Lifestyle  .  Physical activity    Days per week: Not on file    Minutes per session: Not on file  . Stress: Not on file  Relationships  . Social Musician on phone: Not on file    Gets together: Not on file    Attends religious service: Not on file    Active member of club or organization: Not on file    Attends meetings of clubs or organizations: Not on file    Relationship status: Not on file  . Intimate partner violence    Fear of current or ex partner: Not on file    Emotionally abused: Not on file    Physically abused: Not on file    Forced sexual activity: Not on file  Other Topics Concern  . Not on file  Social History Narrative   Patient lives at home with family.   Caffeine Use: 1-2 cups daily   No tobacco, recreational drugs. Social drinking only.   Wears her seatbelt, has smoke alarm in the home.   Feels safe in her relationships.    Past Medical History, Surgical history, Social history, and Family history were reviewed and updated as appropriate.   Please see review of systems for further details on the patient's review from today.   Objective:   Physical Exam:  BP 113/70   Pulse 83   Physical Exam Constitutional:      General: She is not in acute distress.    Appearance: She is well-developed.  Neurological:     Mental Status: She is alert and oriented to person, place, and time.  Psychiatric:        Attention and Perception: She is attentive. She does not  perceive auditory or visual hallucinations.        Mood and Affect: Mood is anxious. Mood is not depressed. Affect is not labile, blunt, angry or inappropriate.        Speech: Speech normal.        Behavior: Behavior normal. Behavior is not agitated, slowed, aggressive or hyperactive.        Thought Content: Thought content normal. Thought content is not paranoid. Thought content does not include homicidal or suicidal ideation. Thought content does not include homicidal or suicidal plan.        Cognition and Memory: Cognition normal.        Judgment: Judgment normal.     Comments: Insight intact. No auditory or visual hallucinations.  Anxiety is still high. Irritable.     Lab Review:     Component Value Date/Time   NA 135 12/25/2016 1254   NA 137 04/15/2014 1554   K 4.0 12/25/2016 1254   CL 100 (L) 12/25/2016 1254   CO2 22 12/25/2016 1254   GLUCOSE 94 12/25/2016 1254   BUN 14 12/25/2016 1254   BUN 15 04/15/2014 1554   CREATININE 0.75 12/25/2016 1254   CREATININE 0.90 01/30/2016 1555   CALCIUM 9.2 12/25/2016 1254   PROT 6.2 (L) 10/22/2016 1009   PROT 6.9 12/29/2015 1400   ALBUMIN 3.3 (L) 10/22/2016 1009   ALBUMIN 4.5 12/29/2015 1400   AST 26 10/22/2016 1009   ALT 25 10/22/2016 1009   ALKPHOS 81 10/22/2016 1009   BILITOT 1.0 10/22/2016 1009   BILITOT 0.6 12/29/2015 1400   GFRNONAA >60 12/25/2016 1254   GFRAA >60 12/25/2016 1254       Component Value Date/Time   WBC 8.1 12/25/2016 1254   RBC 4.34 12/25/2016 1254  HGB 12.0 12/25/2016 1254   HGB 11.9 06/30/2016 1122   HCT 35.8 (L) 12/25/2016 1254   HCT 36.2 06/30/2016 1122   PLT 255 12/25/2016 1254   PLT 237 06/30/2016 1122   MCV 82.5 12/25/2016 1254   MCV 81 06/30/2016 1122   MCH 27.6 12/25/2016 1254   MCHC 33.5 12/25/2016 1254   RDW 14.5 12/25/2016 1254   RDW 14.6 06/30/2016 1122   LYMPHSABS 1.6 10/22/2016 1009   LYMPHSABS 3.6 (H) 06/30/2016 1122   MONOABS 0.3 10/22/2016 1009   EOSABS 0.1 10/22/2016 1009    EOSABS 0.1 06/30/2016 1122   BASOSABS 0.0 10/22/2016 1009   BASOSABS 0.0 06/30/2016 1122    No results found for: POCLITH, LITHIUM   No results found for: PHENYTOIN, PHENOBARB, VALPROATE, CBMZ   .res Assessment: Plan:     Anita HazelKelli was seen today for follow-up, depression, anxiety, adhd and medication problem.  Diagnoses and all orders for this visit:  Bipolar I disorder, most recent episode mixed (HCC) -     ARIPiprazole ([name]) 10 MG tablet; 1/2 tablet daily for 1 week and then 1 daily  Generalized anxiety disorder -     ARIPiprazole ([name]) 10 MG tablet; 1/2 tablet daily for 1 week and then 1 daily  Panic disorder with agoraphobia  Attention deficit hyperactivity disorder (ADHD), combined type  Insomnia due to mental condition  Alopecia    Greater than 50% of face to face time with patient was spent on counseling and coordination of care. We discussed her chronically unstable bipolar disorder with a history of abrupt worsening of depression with suicidal ideation.  She has had multiple med failures as noted above: many for perceived intolerances.  Fortunately she had finally achieved good mood stability with [name] 1500 mg daily.  Unfortunately she had onset of hair loss almost certainly related to the [name].  She has reduced the dose to 500 mg daily.  She has noticed some mild worsening irritability but no major mood swings.  She has started some vitamins to help with the hair loss.  She is afraid to completely discontinue the [name] because of the benefit she seen with it.  Disc risk of relapse with reduction in [name].  Again told the hair loss will probably eventually stop even without a change but she wants to change anyway.  Because of the long history of  instability we need to keep the following options Vraylar, and other atypicals.  Disc SE differences.  Cost barriers have limited options as well.  No options left that aren't antipsychotics. She wants tro  retry [name].  Discussed potential metabolic side effects associated with atypical antipsychotics, as well as potential risk for movement side effects. Advised pt to contact office if movement side effects occur.  She wants to try [name] again and disc Se and usual dosing.  Start lower bc history of mood instability. [name] 10 mg tablet and start 1/2 tablet daily for 1 week and then 1 daily. DC [name] per her request for hair loss  Her anxiety is not well managed but we will not initiate further med changes today because of the side effect issues she has had with so many things were recently.  We may need to further address this in the future.  We could consider benzodiazepines because we have not been able to manage her anxiety and the typical treatment of SSRIs is likely to cause mood instability.  We could consider the potential of switching out [name] for risperidone in  hopes of better antianxiety effects but she has seen antidepressant effect from [name] and is fearful of giving it up.  She does not want medication changes today even though her anxiety is not managed well.  She does not want to add additional medication because of the polypharmacy going on currently.    Her insomnia is managed  For hair loss: B complex, MVI with minerals, Biotin.  This appt was 30 mins.  Follow-up 4 weeks  Lynder Parents MD, DFAPA   Please see After Visit Summary for patient specific instructions.  No future appointments.  No orders of the defined types were placed in this encounter.     -------------------------------

## 2018-12-12 NOTE — Patient Instructions (Signed)
[name] 10 mg tablet and start 1/2 tablet daily for 1 week and then 1 daily.  Ideal to wait 1 week to stop [name] 500 mg daily.

## 2018-12-28 ENCOUNTER — Other Ambulatory Visit: Payer: Self-pay | Admitting: Psychiatry

## 2019-01-04 ENCOUNTER — Ambulatory Visit: Payer: 59 | Admitting: Psychiatry

## 2019-01-10 ENCOUNTER — Ambulatory Visit (INDEPENDENT_AMBULATORY_CARE_PROVIDER_SITE_OTHER): Payer: 59 | Admitting: Psychiatry

## 2019-01-10 ENCOUNTER — Encounter: Payer: Self-pay | Admitting: Psychiatry

## 2019-01-10 DIAGNOSIS — F4001 Agoraphobia with panic disorder: Secondary | ICD-10-CM | POA: Diagnosis not present

## 2019-01-10 DIAGNOSIS — F902 Attention-deficit hyperactivity disorder, combined type: Secondary | ICD-10-CM | POA: Diagnosis not present

## 2019-01-10 DIAGNOSIS — F411 Generalized anxiety disorder: Secondary | ICD-10-CM | POA: Diagnosis not present

## 2019-01-10 DIAGNOSIS — F5105 Insomnia due to other mental disorder: Secondary | ICD-10-CM

## 2019-01-10 DIAGNOSIS — L659 Nonscarring hair loss, unspecified: Secondary | ICD-10-CM

## 2019-01-10 DIAGNOSIS — F3160 Bipolar disorder, current episode mixed, unspecified: Secondary | ICD-10-CM | POA: Diagnosis not present

## 2019-01-10 MED ORDER — ARIPIPRAZOLE 10 MG PO TABS: 10.0000 mg | ORAL_TABLET | Freq: Every day | ORAL | 0 refills | Status: DC

## 2019-01-10 NOTE — Progress Notes (Signed)
Anita BaxterKelli M Ducat [id number] Apr 26, 1981 38 y.o.   Subjective:   Patient ID:  Anita Black is a 38 y.o. (DOB Apr 26, 1981) female.  Chief Complaint:  Chief Complaint  Patient presents with  . Follow-up    bipolar med change    Depression        Associated symptoms include no decreased concentration and no suicidal ideas.  Past medical history includes anxiety.   Anxiety Symptoms include nervous/anxious behavior. Patient reports no confusion, decreased concentration, dizziness or suicidal ideas.      Anita BaxterKelli M Lemieux presents to the office today for follow-up of chronically unstable bipolar disorder and anxiety.  In March 26 , 2020.  She had been switched to [name] and had a good response.   When seen November 29, 2018 which she had reduced [name] on her own to 500 mg daily because of hair loss.  She was cautioned that this would probably not be enough medication to maintain mood stability but she did not want to make any medicine changes or increase the dose back up she was encouraged to take vitamins which often help with [name] related hair loss eventually.  Last visit was December 12, 2018.  She had stopped [name] on her own due to hair loss and refused to consider taking vitamins to counteract this hair loss despite the fact that the [name] had been effective and she has had a very treatment resistant course with multiple med failures.  She asked about taking [name].  [name] was started at 5 mg a day for 1 week to increase then to 10 mg daily as a mood stabilizer.  She went up quicker.    It's been good.  Less irritable.  No SE.  Not manic.  Not much depression.  No change in chronic anxiety.  Sleep is the same and is OK.  Pretty level.  No worsenging mood sx with switch to [name] 10.  She's aware it is a low bipolar dose but satisfied.    Within last week hair loss stopped.    Anxiety still terrible but I still don't want to add anything else bc I'm on so much. Mostly  worry vs physical sx. Ex convinced herself that F would get Covid and had to prepare for it.  No pattern to worry thoughts. No obsessions.   No hx drug abuse or alcohol abuse..     She reduced the lithium to 900 mg daily bc GI SE.  Somethings causing nausea again.  Higher dosage of lithium with less diarrhea manageable for right now.    Sleep OK. No manic SX.  Didn't realize how unstable she was until feeling stable.  past psych meds:  [name], lithium CBZ, Equetro helped but GI side effects, Trileptal, [name], Rexulti, lamotrigine, [name] 120,  Zoloft 150, Trintellix, , mirtazapine, Lexapro, doxazosin.  Duloxetine side effects, long history of sertraline, Lexapro headaches, Trintellix, trazodone, mirtazapine, [name] 1500 good resp but hair loss, Seroquel low dosages with weight gain, Adderall for MS fatigue, [name] for MS fatigue  Review of Systems:  Review of Systems  Skin:       Hair loss  Neurological: Negative for dizziness, tremors and weakness.  Psychiatric/Behavioral: Positive for depression. Negative for agitation, behavioral problems, confusion, decreased concentration, dysphoric mood, hallucinations, self-injury, sleep disturbance and suicidal ideas. The patient is nervous/anxious. The patient is not hyperactive.   Depression resolved  Medications: I have reviewed the patient's current medications.  Current Outpatient Medications  Medication Sig Dispense Refill  .  ARIPiprazole ([name]) 10 MG tablet Take 1 tablet (10 mg total) by mouth daily. 90 tablet 0  . atorvastatin (LIPITOR) 20 MG tablet Take 20 mg by mouth daily.    . BD SAFETYGLIDE SHIELDED NEEDLE 25G X 1" MISC USE AS DIRECTED 10 each 7  . Cholecalciferol (VITAMIN D3) 10000 units TABS Take by mouth.    . lamoTRIgine (LAMICTAL) 200 MG tablet Take 1 tablet (200 mg total) by mouth daily. 90 tablet 1  . [name] 120 MG TABS TAKE 1 TABLET BY MOUTH EVERY DAY 30 tablet 2  . lisdexamfetamine ([name]) 60 MG capsule Take 1  capsule (60 mg total) by mouth every morning. 30 capsule 0  . lithium carbonate 300 MG capsule TAKE 3 CAPSULES BY MOUTH AT BEDTIME 270 capsule 1  . metFORMIN (GLUCOPHAGE) 500 MG tablet 1000 mg BID with meals. Needs appt prior to anymore refills. 60 tablet 0  . ocrelizumab (OCREVUS) 300 MG/10ML injection Inject into the vein.    [name] PAZEO 0.7 % SOLN PLACE 1 DROP IN [facility] EYE ONCE A DAY    . rizatriptan (MAXALT-MLT) 10 MG disintegrating tablet Take 1 tablet (10 mg total) by mouth as needed for migraine. May repeat in 2 hours if needed 9 tablet 11  . SPRINTEC 28 0.25-35 MG-MCG tablet     . valACYclovir (VALTREX) 1000 MG tablet 2000 mg every 12 hours for 2 doses. 8 tablet 3   No current facility-administered medications for this visit.     Medication Side Effects: .diarrhea resolved.  Allergies: No Known Allergies  Past Medical History:  Diagnosis Date  . Anxiety   . Depression   . Diabetes type 2, controlled (HCC)   . Elevated hemoglobin A1c   . Hyperlipidemia   . Migraine   . Multiple sclerosis (HCC)    receives Tysabri infusion q 28 days    Family History  Problem Relation Age of Onset  . Diabetes Mother   . Hypertension Mother   . Dementia Mother   . Bipolar disorder Mother   . Heart failure Father   . Hypertension Father   . Heart disease Father   . Ovarian cancer Maternal Grandmother   . Alzheimer's disease Paternal Grandmother   . Skin cancer Maternal Grandfather     Social History   Socioeconomic History  . Marital status: Married    Spouse name: Rollen Sox  . Number of children: 1  . Years of education: MA  . Highest education level: Not on file  Occupational History    Employer: [employer]  Social Needs  . Financial resource strain: Not on file  . Food insecurity    Worry: Not on file    Inability: Not on file  . Transportation needs    Medical: Not on file    Non-medical: Not on file  Tobacco Use  . Smoking status: Never Smoker  . Smokeless  tobacco: Never Used  Substance and Sexual Activity  . Alcohol use: Yes    Comment: socially; once a week  . Drug use: No  . Sexual activity: Yes    Birth control/protection: Pill  Lifestyle  . Physical activity    Days per week: Not on file    Minutes per session: Not on file  . Stress: Not on file  Relationships  . Social Musician on phone: Not on file    Gets together: Not on file    Attends religious service: Not on file    Active member  of club or organization: Not on file    Attends meetings of clubs or organizations: Not on file    Relationship status: Not on file  . Intimate partner violence    Fear of current or ex partner: Not on file    Emotionally abused: Not on file    Physically abused: Not on file    Forced sexual activity: Not on file  Other Topics Concern  . Not on file  Social History Narrative   Patient lives at home with family.   Caffeine Use: 1-2 cups daily   No tobacco, recreational drugs. Social drinking only.   Wears her seatbelt, has smoke alarm in the home.   Feels safe in her relationships.    Past Medical History, Surgical history, Social history, and Family history were reviewed and updated as appropriate.   Please see review of systems for further details on the patient's review from today.   Objective:   Physical Exam:  There were no vitals taken for this visit.  Physical Exam Constitutional:      General: She is not in acute distress.    Appearance: Normal appearance. She is well-developed.  Neurological:     Mental Status: She is alert and oriented to person, place, and time.     Cranial Nerves: No dysarthria.     Motor: No tremor.  Psychiatric:        Attention and Perception: She is attentive. She does not perceive auditory or visual hallucinations.        Mood and Affect: Mood is anxious. Mood is not depressed. Affect is not labile, blunt, angry or inappropriate.        Speech: Speech normal.        Behavior:  Behavior normal. Behavior is not agitated, slowed, aggressive or hyperactive.        Thought Content: Thought content normal. Thought content is not paranoid. Thought content does not include homicidal or suicidal ideation. Thought content does not include homicidal or suicidal plan.        Cognition and Memory: Cognition normal.        Judgment: Judgment normal.     Comments: Insight intact. No auditory or visual hallucinations.  Anxiety is still high. Irritable.     Lab Review:     Component Value Date/Time   NA 135 12/25/2016 1254   NA 137 04/15/2014 1554   K 4.0 12/25/2016 1254   CL 100 (L) 12/25/2016 1254   CO2 22 12/25/2016 1254   GLUCOSE 94 12/25/2016 1254   BUN 14 12/25/2016 1254   BUN 15 04/15/2014 1554   CREATININE 0.75 12/25/2016 1254   CREATININE 0.90 01/30/2016 1555   CALCIUM 9.2 12/25/2016 1254   PROT 6.2 (L) 10/22/2016 1009   PROT 6.9 12/29/2015 1400   ALBUMIN 3.3 (L) 10/22/2016 1009   ALBUMIN 4.5 12/29/2015 1400   AST 26 10/22/2016 1009   ALT 25 10/22/2016 1009   ALKPHOS 81 10/22/2016 1009   BILITOT 1.0 10/22/2016 1009   BILITOT 0.6 12/29/2015 1400   GFRNONAA >60 12/25/2016 1254   GFRAA >60 12/25/2016 1254       Component Value Date/Time   WBC 8.1 12/25/2016 1254   RBC 4.34 12/25/2016 1254   HGB 12.0 12/25/2016 1254   HGB 11.9 06/30/2016 1122   HCT 35.8 (L) 12/25/2016 1254   HCT 36.2 06/30/2016 1122   PLT 255 12/25/2016 1254   PLT 237 06/30/2016 1122   MCV 82.5 12/25/2016 1254   MCV  81 06/30/2016 1122   MCH 27.6 12/25/2016 1254   MCHC 33.5 12/25/2016 1254   RDW 14.5 12/25/2016 1254   RDW 14.6 06/30/2016 1122   LYMPHSABS 1.6 10/22/2016 1009   LYMPHSABS 3.6 (H) 06/30/2016 1122   MONOABS 0.3 10/22/2016 1009   EOSABS 0.1 10/22/2016 1009   EOSABS 0.1 06/30/2016 1122   BASOSABS 0.0 10/22/2016 1009   BASOSABS 0.0 06/30/2016 1122    No results found for: POCLITH, LITHIUM   No results found for: PHENYTOIN, PHENOBARB, VALPROATE, CBMZ    .res Assessment: Plan:     Harvin HazelKelli was seen today for follow-up.  Diagnoses and all orders for this visit:  Panic disorder with agoraphobia  Bipolar I disorder, most recent episode mixed (HCC) -     ARIPiprazole ([name]) 10 MG tablet; Take 1 tablet (10 mg total) by mouth daily.  Generalized anxiety disorder -     ARIPiprazole ([name]) 10 MG tablet; Take 1 tablet (10 mg total) by mouth daily.  Attention deficit hyperactivity disorder (ADHD), combined type  Insomnia due to mental condition  Alopecia    Greater than 50% of 30-minute face to face time with patient was spent on counseling and coordination of care. We discussed her chronically unstable bipolar disorder with a history of abrupt worsening of depression with suicidal ideation.    Fortunately she said improvement with [name] 10 mg daily.  He still early this may not be durable given her long history of instability but so far her mood is stable with that and she is tolerating the medication.  She is aware this is less than the usual dose of [name] for bipolar disorder but we will not change it today since she is improved.  Her anxiety is not well managed but we will not initiate further med changes today because of the side effect issues she has had with so many things were recently.  We may need to further address this in the future.  We could consider benzodiazepines because we have not been able to manage her anxiety and the typical treatment of SSRIs is likely to cause mood instability.  We could consider the potential of switching out [name] for risperidone in hopes of better antianxiety effects but she has seen antidepressant effect from JordanLatuda and is fearful of giving it up.  She does not want medication changes today even though her anxiety is not managed well.  She does not want to add additional medication because of the polypharmacy going on currently.    Her insomnia is managed  For hair loss: B complex, MVI  with minerals, Biotin.  Continue these.  This appt was 30 mins.  Follow-up 4 weeks  Meredith Staggersarey Cottle MD, DFAPA   Please see After Visit Summary for patient specific instructions.  Future Appointments  Date Time Provider Department Center  03/07/2019  9:00 AM Cottle, Steva Readyarey G Jr., MD [facility] None    No orders of the defined types were placed in this encounter.     -------------------------------

## 2019-01-13 ENCOUNTER — Other Ambulatory Visit: Payer: Self-pay | Admitting: Psychiatry

## 2019-01-14 ENCOUNTER — Other Ambulatory Visit: Payer: Self-pay | Admitting: Psychiatry

## 2019-01-25 ENCOUNTER — Ambulatory Visit: Payer: 59 | Admitting: Psychiatry

## 2019-01-30 ENCOUNTER — Ambulatory Visit: Payer: 59 | Admitting: Psychiatry

## 2019-03-07 ENCOUNTER — Ambulatory Visit: Payer: 59 | Admitting: Psychiatry

## 2019-03-25 ENCOUNTER — Other Ambulatory Visit: Payer: Self-pay | Admitting: Psychiatry

## 2019-03-29 ENCOUNTER — Ambulatory Visit: Payer: 59 | Admitting: Psychiatry

## 2019-04-24 ENCOUNTER — Other Ambulatory Visit: Payer: Self-pay | Admitting: Psychiatry

## 2019-05-07 ENCOUNTER — Ambulatory Visit (INDEPENDENT_AMBULATORY_CARE_PROVIDER_SITE_OTHER): Payer: 59 | Admitting: Psychiatry

## 2019-05-07 ENCOUNTER — Encounter: Payer: Self-pay | Admitting: Psychiatry

## 2019-05-07 DIAGNOSIS — F5105 Insomnia due to other mental disorder: Secondary | ICD-10-CM

## 2019-05-07 DIAGNOSIS — F3160 Bipolar disorder, current episode mixed, unspecified: Secondary | ICD-10-CM

## 2019-05-07 DIAGNOSIS — F902 Attention-deficit hyperactivity disorder, combined type: Secondary | ICD-10-CM

## 2019-05-07 DIAGNOSIS — F4001 Agoraphobia with panic disorder: Secondary | ICD-10-CM

## 2019-05-07 DIAGNOSIS — F411 Generalized anxiety disorder: Secondary | ICD-10-CM

## 2019-05-07 MED ORDER — LAMOTRIGINE 100 MG PO TABS: ORAL_TABLET | ORAL | 0 refills | Status: DC

## 2019-05-07 NOTE — Progress Notes (Signed)
Anita Black [id number] [birth year] 38 y.o.   Subjective:   Patient ID:  Anita Black is a 38 y.o. (DOB [birth year]) female.  Chief Complaint:  Chief Complaint  Patient presents with  . Follow-up     Medication Mangement  . Anxiety     Medication Mangement  . ADHD     Medication Mangement  . Other    Bipolar 1    Depression        Associated symptoms include no decreased concentration and no suicidal ideas.  Past medical history includes anxiety.   Anxiety Symptoms include nervous/anxious behavior. Patient reports no confusion, decreased concentration, dizziness, nausea or suicidal ideas.      Anita Black presents to the office today for follow-up of chronically unstable bipolar disorder and anxiety.  In March 26 , 2020.  She had been switched to [name] and had a good response.   When seen November 29, 2018 which she had reduced [name] on her own to 500 mg daily because of hair loss.  She was cautioned that this would probably not be enough medication to maintain mood stability but she did not want to make any medicine changes or increase the dose back up she was encouraged to take vitamins which often help with [name] related hair loss eventually.  visit December 12, 2018.  She had stopped [name] on her own due to hair loss and refused to consider taking vitamins to counteract this hair loss despite the fact that the [name] had been effective and she has had a very treatment resistant course with multiple med failures.  She asked about taking [name].  [name] was started at 5 mg a day for 1 week to increase then to 10 mg daily as a mood stabilizer.  She went up quicker.    It's been good.  Less irritable.  No SE.  Not manic.  Not much depression.  No change in chronic anxiety.  Sleep is the same and is OK.  Pretty level.  No worsenging mood sx with switch to [name] 10.  She's aware it is a low bipolar dose but satisfied.    Last seen January 10, 2019.  She had  seen improvement with [name] 10 mg and so no meds were changed.  Stopped [name] 3-4 weeks later dT stomach pain which resolved off the meds.  Still taking [name] 120 daily, lamtrogine 200, lithium 900, [name] 60 daily.  Overall ok with a little moodiness.  Asks about increasing lamotrigine back to 200 BID.    Within last week hair loss stopped.    Anxiety still terrible but I still don't want to add anything else bc I'm on so much. Mostly worry vs physical sx. Ex convinced herself that F would get Covid and had to prepare for it.  No pattern to worry thoughts. No obsessions.  No panic attacks.  Sleep is okay.  Not profoundly depressed.   No hx drug abuse or alcohol abuse..     She reduced the lithium to 900 mg daily bc GI SE.  Somethings causing nausea again.  Higher dosage of lithium with less diarrhea manageable for right now.    Sleep OK. No manic SX.  Didn't realize how unstable she was until feeling stable.  past psych meds:  [name], lithium CBZ, Equetro helped but GI side effects, Trileptal, [name] retrial 10 GI, Rexulti, lamotrigine, [name] 120,   Zoloft 150, Trintellix, , mirtazapine, Lexapro, doxazosin.  Duloxetine side effects, long history of sertraline,  Lexapro headaches, Trintellix, trazodone, mirtazapine, [name] 1500 good resp but hair loss, Seroquel low dosages with weight gain, Adderall for MS fatigue, [name] for MS fatigue  Review of Systems:  Review of Systems  Gastrointestinal: Negative for nausea and vomiting.  Skin:       Hair loss  Neurological: Negative for dizziness, tremors and weakness.  Psychiatric/Behavioral: Positive for depression. Negative for agitation, behavioral problems, confusion, decreased concentration, dysphoric mood, hallucinations, self-injury, sleep disturbance and suicidal ideas. The patient is nervous/anxious. The patient is not hyperactive.   Depression resolved  Medications: I have reviewed the patient's current  medications.  Current Outpatient Medications  Medication Sig Dispense Refill  . atorvastatin (LIPITOR) 20 MG tablet Take 20 mg by mouth daily.    . BD SAFETYGLIDE SHIELDED NEEDLE 25G X 1" MISC USE AS DIRECTED 10 each 7  . Cholecalciferol (VITAMIN D3) 10000 units TABS Take by mouth.    . lamoTRIgine (LAMICTAL) 100 MG tablet 1 in the AM and 2 in PM 270 tablet 0  . [name] 120 MG TABS TAKE 1 TABLET BY MOUTH EVERY DAY 30 tablet 2  . lisdexamfetamine ([name]) 60 MG capsule Take 1 capsule (60 mg total) by mouth every morning. 30 capsule 0  . lithium carbonate 300 MG capsule TAKE 3 CAPSULES BY MOUTH AT BEDTIME 270 capsule 1  . metFORMIN (GLUCOPHAGE) 500 MG tablet 1000 mg BID with meals. Needs appt prior to anymore refills. 60 tablet 0  . ocrelizumab (OCREVUS) 300 MG/10ML injection Inject into the vein.    [name] PAZEO 0.7 % SOLN PLACE 1 DROP IN [facility] EYE ONCE A DAY    . rizatriptan (MAXALT-MLT) 10 MG disintegrating tablet Take 1 tablet (10 mg total) by mouth as needed for migraine. May repeat in 2 hours if needed 9 tablet 11  . SPRINTEC 28 0.25-35 MG-MCG tablet     . valACYclovir (VALTREX) 1000 MG tablet 2000 mg every 12 hours for 2 doses. 8 tablet 3   No current facility-administered medications for this visit.    Medication Side Effects: .diarrhea resolved.  Allergies: No Known Allergies  Past Medical History:  Diagnosis Date  . Anxiety   . Depression   . Diabetes type 2, controlled (HCC)   . Elevated hemoglobin A1c   . Hyperlipidemia   . Migraine   . Multiple sclerosis (HCC)    receives Tysabri infusion q 28 days    Family History  Problem Relation Age of Onset  . Diabetes Mother   . Hypertension Mother   . Dementia Mother   . Bipolar disorder Mother   . Heart failure Father   . Hypertension Father   . Heart disease Father   . Ovarian cancer Maternal Grandmother   . Alzheimer's disease Paternal Grandmother   . Skin cancer Maternal Grandfather     Social History    Socioeconomic History  . Marital status: Married    Spouse name: Anita Black  . Number of children: 1  . Years of education: MA  . Highest education level: Not on file  Occupational History    Employer: [employer]  Tobacco Use  . Smoking status: Never Smoker  . Smokeless tobacco: Never Used  Substance and Sexual Activity  . Alcohol use: Yes    Comment: socially; once a week  . Drug use: No  . Sexual activity: Yes    Birth control/protection: Pill  Other Topics Concern  . Not on file  Social History Narrative   Patient lives at home with family.  Caffeine Use: 1-2 cups daily   No tobacco, recreational drugs. Social drinking only.   Wears her seatbelt, has smoke alarm in the home.   Feels safe in her relationships.   Social Determinants of Health   Financial Resource Strain:   . Difficulty of Paying Living Expenses: Not on file  Food Insecurity:   . Worried About Charity fundraiser in the Last Year: Not on file  . Ran Out of Food in the Last Year: Not on file  Transportation Needs:   . Lack of Transportation (Medical): Not on file  . Lack of Transportation (Non-Medical): Not on file  Physical Activity:   . Days of Exercise per Week: Not on file  . Minutes of Exercise per Session: Not on file  Stress:   . Feeling of Stress : Not on file  Social Connections:   . Frequency of Communication with Friends and Family: Not on file  . Frequency of Social Gatherings with Friends and Family: Not on file  . Attends Religious Services: Not on file  . Active Member of Clubs or Organizations: Not on file  . Attends Archivist Meetings: Not on file  . Marital Status: Not on file  Intimate Partner Violence:   . Fear of Current or Ex-Partner: Not on file  . Emotionally Abused: Not on file  . Physically Abused: Not on file  . Sexually Abused: Not on file    Past Medical History, Surgical history, Social history, and Family history were reviewed and updated as  appropriate.   Please see review of systems for further details on the patient's review from today.   Objective:   Physical Exam:  There were no vitals taken for this visit.  Physical Exam Neurological:     Mental Status: She is alert and oriented to person, place, and time.     Cranial Nerves: No dysarthria.  Psychiatric:        Attention and Perception: Attention and perception normal.        Mood and Affect: Mood is anxious.        Speech: Speech normal.        Behavior: Behavior is cooperative.        Thought Content: Thought content normal. Thought content is not paranoid or delusional. Thought content does not include homicidal or suicidal ideation. Thought content does not include homicidal or suicidal plan.        Cognition and Memory: Cognition and memory normal.        Judgment: Judgment normal.     Comments: Insight intact Labile mood including periods of irritability and depression     Lab Review:     Component Value Date/Time   NA 135 12/25/2016 1254   NA 137 04/15/2014 1554   K 4.0 12/25/2016 1254   CL 100 (L) 12/25/2016 1254   CO2 22 12/25/2016 1254   GLUCOSE 94 12/25/2016 1254   BUN 14 12/25/2016 1254   BUN 15 04/15/2014 1554   CREATININE 0.75 12/25/2016 1254   CREATININE 0.90 01/30/2016 1555   CALCIUM 9.2 12/25/2016 1254   PROT 6.2 (L) 10/22/2016 1009   PROT 6.9 12/29/2015 1400   ALBUMIN 3.3 (L) 10/22/2016 1009   ALBUMIN 4.5 12/29/2015 1400   AST 26 10/22/2016 1009   ALT 25 10/22/2016 1009   ALKPHOS 81 10/22/2016 1009   BILITOT 1.0 10/22/2016 1009   BILITOT 0.6 12/29/2015 1400   GFRNONAA >60 12/25/2016 1254   GFRAA >60 12/25/2016 1254  Component Value Date/Time   WBC 8.1 12/25/2016 1254   RBC 4.34 12/25/2016 1254   HGB 12.0 12/25/2016 1254   HGB 11.9 06/30/2016 1122   HCT 35.8 (L) 12/25/2016 1254   HCT 36.2 06/30/2016 1122   PLT 255 12/25/2016 1254   PLT 237 06/30/2016 1122   MCV 82.5 12/25/2016 1254   MCV 81 06/30/2016 1122   MCH  27.6 12/25/2016 1254   MCHC 33.5 12/25/2016 1254   RDW 14.5 12/25/2016 1254   RDW 14.6 06/30/2016 1122   LYMPHSABS 1.6 10/22/2016 1009   LYMPHSABS 3.6 (H) 06/30/2016 1122   MONOABS 0.3 10/22/2016 1009   EOSABS 0.1 10/22/2016 1009   EOSABS 0.1 06/30/2016 1122   BASOSABS 0.0 10/22/2016 1009   BASOSABS 0.0 06/30/2016 1122    No results found for: POCLITH, LITHIUM   No results found for: PHENYTOIN, PHENOBARB, VALPROATE, CBMZ   .res Assessment: Plan:     Jazzmine was seen today for follow-up, anxiety, adhd and other.  Diagnoses and all orders for this visit:  Bipolar I disorder, most recent episode mixed (HCC) -     lamoTRIgine (LAMICTAL) 100 MG tablet; 1 in the AM and 2 in PM  Generalized anxiety disorder  Panic disorder with agoraphobia  Attention deficit hyperactivity disorder (ADHD), combined type  Insomnia due to mental condition    Greater than 50% of 30-minute non face to face time with patient was spent on counseling and coordination of care. We discussed her chronically unstable bipolar disorder with a history of abrupt worsening of depression with suicidal ideation.  She continues to be unstable.  Fortunately she said improvement with [name] 10 mg daily at the last visit.  Unfortunately, she started having GI side effects and stopped the medication.  She is seeing a return of some mood lability which is not extreme.  She wonders if she had problems tolerating it because of taking so many other medications.  Asked about increasing lamotrigine to 300 mg daily.  Ok to retry it.    Her anxiety is not well managed but we will not initiate further med changes today because of the side effect issues she has had with so many things were recently.  We may need to further address this in the future.  We could consider benzodiazepines because we have not been able to manage her anxiety and the typical treatment of SSRIs is likely to cause mood instability.  We could consider the  potential of switching out [name] for risperidone in hopes of better antianxiety effects but she has seen antidepressant effect from [name] and is fearful of giving it up.  She does not want medication changes today except as noted even though her anxiety is not managed well.  She does not want to add additional medication because of the polypharmacy going on currently.    Her insomnia is managed  Follow-up 2-3 mos  Meredith Staggers MD, DFAPA   Please see After Visit Summary for patient specific instructions.  Future Appointments  Date Time Provider Department Center  07/04/2019  9:30 AM Cottle, Steva Ready., MD [facility] None    No orders of the defined types were placed in this encounter.     -------------------------------

## 2019-05-22 ENCOUNTER — Telehealth: Payer: Self-pay | Admitting: Psychiatry

## 2019-05-22 NOTE — Telephone Encounter (Signed)
Noted thank you

## 2019-05-22 NOTE — Telephone Encounter (Signed)
Pt called requesting samples of [name]. Please advise. Last telehealth appt 12/28. Next scheduled appt 2/24.

## 2019-05-22 NOTE — Telephone Encounter (Signed)
I have enough to give her a 3 week supply. Called to make her aware. She said she will meet her deductible the middle of Feb. Then ins. Should cover.

## 2019-06-16 ENCOUNTER — Other Ambulatory Visit: Payer: Self-pay | Admitting: Psychiatry

## 2019-07-04 ENCOUNTER — Encounter: Payer: Self-pay | Admitting: Psychiatry

## 2019-07-04 ENCOUNTER — Ambulatory Visit (INDEPENDENT_AMBULATORY_CARE_PROVIDER_SITE_OTHER): Payer: 59 | Admitting: Psychiatry

## 2019-07-04 DIAGNOSIS — F902 Attention-deficit hyperactivity disorder, combined type: Secondary | ICD-10-CM

## 2019-07-04 DIAGNOSIS — F411 Generalized anxiety disorder: Secondary | ICD-10-CM

## 2019-07-04 DIAGNOSIS — Z79899 Other long term (current) drug therapy: Secondary | ICD-10-CM

## 2019-07-04 DIAGNOSIS — F5105 Insomnia due to other mental disorder: Secondary | ICD-10-CM

## 2019-07-04 DIAGNOSIS — F4001 Agoraphobia with panic disorder: Secondary | ICD-10-CM

## 2019-07-04 DIAGNOSIS — F3160 Bipolar disorder, current episode mixed, unspecified: Secondary | ICD-10-CM | POA: Diagnosis not present

## 2019-07-04 NOTE — Progress Notes (Signed)
Anita Black [id number] [birth year] 39 y.o.  Virtual Visit via WebEX  I connected with pt by WebEx and verified that I am speaking with the correct person using two identifiers.   I discussed the limitations, risks, security and privacy concerns of performing an evaluation and management service by [facility] and the availability of in person appointments. I also discussed with the patient that there may be a patient responsible charge related to this service. The patient expressed understanding and agreed to proceed.  I discussed the assessment and treatment plan with the patient. The patient was provided an opportunity to ask questions and all were answered. The patient agreed with the plan and demonstrated an understanding of the instructions.   The patient was advised to call back or seek an in-person evaluation if the symptoms worsen or if the condition fails to improve as anticipated.  I provided 30 minutes of video time during this encounter. The call started at 930 and ended at 10:00. The patient was located at home and the provider was located office.   Subjective:   Patient ID:  Anita Black is a 39 y.o. (DOB March 10, 1981) female.  Chief Complaint:  Chief Complaint  Patient presents with  . Manic Behavior    Psychiatric med management  . Anxiety    Anxiety Symptoms include nervous/anxious behavior. Patient reports no confusion, decreased concentration, dizziness, nausea or suicidal ideas.    Depression        Associated symptoms include no decreased concentration and no suicidal ideas.  Past medical history includes anxiety.     Anita Black presents to the office today for follow-up of chronically unstable bipolar disorder and anxiety.  In March 26 , 2020.  She had been switched to [name] and had a good response.   When seen November 29, 2018 which she had reduced [name] on her own to 500 mg daily because of hair loss.  She was cautioned that this would probably  not be enough medication to maintain mood stability but she did not want to make any medicine changes or increase the dose back up she was encouraged to take vitamins which often help with [name] related hair loss eventually.  visit December 12, 2018.  She had stopped [name] on her own due to hair loss and refused to consider taking vitamins to counteract this hair loss despite the fact that the [name] had been effective and she has had a very treatment resistant course with multiple med failures.  She asked about taking [name].  [name] was started at 5 mg a day for 1 week to increase then to 10 mg daily as a mood stabilizer.  She went up quicker.    It's been good.  Less irritable.  No SE.  Not manic.  Not much depression.  No change in chronic anxiety.  Sleep is the same and is OK.  Pretty level.  No worsenging mood sx with switch to [name] 10.  She's aware it is a low bipolar dose but satisfied.    seen January 10, 2019.  She had seen improvement with [name] 10 mg and so no meds were changed. Last seen May 07, 2019.  She had stopped [name] 3-4 weeks later dT stomach pain which resolved off the meds.  Still taking [name] 120 daily, lamtrogine 200, lithium 900, [name] 60 daily.  We increased lamotrigine to 300 mg daily to try to help with ongoing mood instability.  No difference with increased lamotrigine to 300 mg.  Emotional outbursts easily triggered.  I got to do something about the outbursts.    Overall ok with a little moodiness.  Asks about increasing lamotrigine back to 200 BID.    Within last week hair loss stopped.    Anxiety still terrible but I still don't want to add anything else bc I'm on so much. Mostly worry vs physical sx. Ex convinced herself that F would get Covid and had to prepare for it.  No pattern to worry thoughts. No obsessions.  No panic attacks.  Sleep is okay.  Not profoundly depressed.   No hx drug abuse or alcohol abuse..     She reduced the lithium  to 900 mg daily bc GI SE.  Somethings causing nausea again.  Higher dosage of lithium with less diarrhea manageable for right now.    Sleep OK. No manic SX.  Didn't realize how unstable she was until feeling stable.  past psych meds:  [name], lithium CBZ, Equetro helped but GI side effects, Trileptal, [name] retrial 10 GI, Rexulti, lamotrigine, [name] 120,   Zoloft 150, Trintellix, , mirtazapine, Lexapro, doxazosin.  Duloxetine side effects, long history of sertraline, Lexapro headaches, Trintellix, trazodone, mirtazapine, [name] 1500 good resp but hair loss, Seroquel low dosages with weight gain, Adderall for MS fatigue, [name] for MS fatigue  Review of Systems:  Review of Systems  Gastrointestinal: Negative for diarrhea, nausea and vomiting.  Skin:       Hair loss  Neurological: Negative for dizziness, tremors and weakness.  Psychiatric/Behavioral: Positive for depression. Negative for agitation, behavioral problems, confusion, decreased concentration, dysphoric mood, hallucinations, self-injury, sleep disturbance and suicidal ideas. The patient is nervous/anxious. The patient is not hyperactive.   Depression resolved  Medications: I have reviewed the patient's current medications.  Current Outpatient Medications  Medication Sig Dispense Refill  . atorvastatin (LIPITOR) 20 MG tablet Take 20 mg by mouth daily.    . BD SAFETYGLIDE SHIELDED NEEDLE 25G X 1" MISC USE AS DIRECTED 10 each 7  . Cholecalciferol (VITAMIN D3) 10000 units TABS Take by mouth.    . lamoTRIgine (LAMICTAL) 100 MG tablet 1 in the AM and 2 in PM 270 tablet 0  . [name] 120 MG TABS TAKE 1 TABLET BY MOUTH EVERY DAY 30 tablet 2  . lisdexamfetamine ([name]) 60 MG capsule Take 1 capsule (60 mg total) by mouth every morning. 30 capsule 0  . lithium carbonate 300 MG capsule TAKE 3 CAPSULES BY MOUTH AT BEDTIME 270 capsule 1  . metFORMIN (GLUCOPHAGE) 500 MG tablet 1000 mg BID with meals. Needs appt prior to anymore refills.  60 tablet 0  . ocrelizumab (OCREVUS) 300 MG/10ML injection Inject into the vein.    [name] PAZEO 0.7 % SOLN PLACE 1 DROP IN [facility] EYE ONCE A DAY    . rizatriptan (MAXALT-MLT) 10 MG disintegrating tablet Take 1 tablet (10 mg total) by mouth as needed for migraine. May repeat in 2 hours if needed 9 tablet 11  . SPRINTEC 28 0.25-35 MG-MCG tablet     . valACYclovir (VALTREX) 1000 MG tablet 2000 mg every 12 hours for 2 doses. 8 tablet 3   No current facility-administered medications for this visit.    Medication Side Effects: .diarrhea resolved.  Allergies: No Known Allergies  Past Medical History:  Diagnosis Date  . Anxiety   . Depression   . Diabetes type 2, controlled (HCC)   . Elevated hemoglobin A1c   . Hyperlipidemia   . Migraine   . Multiple sclerosis (  [place])    receives Tysabri infusion q 28 days    Family History  Problem Relation Age of Onset  . Diabetes Mother   . Hypertension Mother   . Dementia Mother   . Bipolar disorder Mother   . Heart failure Father   . Hypertension Father   . Heart disease Father   . Ovarian cancer Maternal Grandmother   . Alzheimer's disease Paternal Grandmother   . Skin cancer Maternal Grandfather     Social History   Socioeconomic History  . Marital status: Married    Spouse name: [name]  . Number of children: 1  . Years of education: MA  . Highest education level: Not on file  Occupational History    Employer: [employer]  Tobacco Use  . Smoking status: Never Smoker  . Smokeless tobacco: Never Used  Substance and Sexual Activity  . Alcohol use: Yes    Comment: socially; once a week  . Drug use: No  . Sexual activity: Yes    Birth control/protection: Pill  Other Topics Concern  . Not on file  Social History Narrative   Patient lives at home with family.   Caffeine Use: 1-2 cups daily   No tobacco, recreational drugs. Social drinking only.   Wears her seatbelt, has smoke alarm in the home.   Feels safe in her  relationships.   Social Determinants of Health   Financial Resource Strain:   . Difficulty of Paying Living Expenses: Not on file  Food Insecurity:   . Worried About Charity fundraiser in the Last Year: Not on file  . Ran Out of Food in the Last Year: Not on file  Transportation Needs:   . Lack of Transportation (Medical): Not on file  . Lack of Transportation (Non-Medical): Not on file  Physical Activity:   . Days of Exercise per Week: Not on file  . Minutes of Exercise per Session: Not on file  Stress:   . Feeling of Stress : Not on file  Social Connections:   . Frequency of Communication with Friends and Family: Not on file  . Frequency of Social Gatherings with Friends and Family: Not on file  . Attends Religious Services: Not on file  . Active Member of Clubs or Organizations: Not on file  . Attends Archivist Meetings: Not on file  . Marital Status: Not on file  Intimate Partner Violence:   . Fear of Current or Ex-Partner: Not on file  . Emotionally Abused: Not on file  . Physically Abused: Not on file  . Sexually Abused: Not on file    Past Medical History, Surgical history, Social history, and Family history were reviewed and updated as appropriate.   Please see review of systems for further details on the patient's review from today.   Objective:   Physical Exam:  There were no vitals taken for this visit.  Physical Exam Neurological:     Mental Status: She is alert and oriented to person, place, and time.     Cranial Nerves: No dysarthria.  Psychiatric:        Attention and Perception: Attention and perception normal.        Mood and Affect: Mood is anxious. Mood is not depressed.        Speech: Speech normal.        Behavior: Behavior is cooperative.        Thought Content: Thought content normal. Thought content is not paranoid or delusional. Thought  content does not include homicidal or suicidal ideation. Thought content does not include  homicidal or suicidal plan.        Cognition and Memory: Cognition and memory normal.        Judgment: Judgment normal.     Comments: Insight intact Labile mood including periods of irritability and depression     Lab Review:     Component Value Date/Time   NA 135 12/25/2016 1254   NA 137 04/15/2014 1554   K 4.0 12/25/2016 1254   CL 100 (L) 12/25/2016 1254   CO2 22 12/25/2016 1254   GLUCOSE 94 12/25/2016 1254   BUN 14 12/25/2016 1254   BUN 15 04/15/2014 1554   CREATININE 0.75 12/25/2016 1254   CREATININE 0.90 01/30/2016 1555   CALCIUM 9.2 12/25/2016 1254   PROT 6.2 (L) 10/22/2016 1009   PROT 6.9 12/29/2015 1400   ALBUMIN 3.3 (L) 10/22/2016 1009   ALBUMIN 4.5 12/29/2015 1400   AST 26 10/22/2016 1009   ALT 25 10/22/2016 1009   ALKPHOS 81 10/22/2016 1009   BILITOT 1.0 10/22/2016 1009   BILITOT 0.6 12/29/2015 1400   GFRNONAA >60 12/25/2016 1254   GFRAA >60 12/25/2016 1254       Component Value Date/Time   WBC 8.1 12/25/2016 1254   RBC 4.34 12/25/2016 1254   HGB 12.0 12/25/2016 1254   HGB 11.9 06/30/2016 1122   HCT 35.8 (L) 12/25/2016 1254   HCT 36.2 06/30/2016 1122   PLT 255 12/25/2016 1254   PLT 237 06/30/2016 1122   MCV 82.5 12/25/2016 1254   MCV 81 06/30/2016 1122   MCH 27.6 12/25/2016 1254   MCHC 33.5 12/25/2016 1254   RDW 14.5 12/25/2016 1254   RDW 14.6 06/30/2016 1122   LYMPHSABS 1.6 10/22/2016 1009   LYMPHSABS 3.6 (H) 06/30/2016 1122   MONOABS 0.3 10/22/2016 1009   EOSABS 0.1 10/22/2016 1009   EOSABS 0.1 06/30/2016 1122   BASOSABS 0.0 10/22/2016 1009   BASOSABS 0.0 06/30/2016 1122    No results found for: POCLITH, LITHIUM   No results found for: PHENYTOIN, PHENOBARB, VALPROATE, CBMZ   .res Assessment: Plan:     Tylicia was seen today for manic behavior and anxiety.  Diagnoses and all orders for this visit:  Bipolar I disorder, most recent episode mixed (HCC) -     Lithium level -     TSH -     Basic metabolic panel  Lithium use -      Lithium level -     TSH -     Basic metabolic panel  Generalized anxiety disorder  Panic disorder with agoraphobia  Attention deficit hyperactivity disorder (ADHD), combined type  Insomnia due to mental condition    Greater than 50% of 30-minute non face to face time with patient was spent on counseling and coordination of care. We discussed her chronically unstable bipolar disorder with a history of abrupt worsening of depression with suicidal ideation.  She continues to be unstable.  She is having excessive anger outbursts and mood reactivity over minor incidents the causing distress to her and those around her.  She is not markedly depressed.  The increase in lamotrigine did not help from 200 to 300 mg daily.  She is willing to increase the lithium again and take Imodium to deal with the diarrhea.  Reduce lamotrigine Back to 200 mg daily.   She agrees to increase the lithium back to 1200 mg daily.   Weight 1 week then get lithium  level BMP and TSH   Her anxiety is not well managed but we will not initiate further med changes today because of the side effect issues she has had with so many things were recently.  We may need to further address this in the future.  We could consider benzodiazepines because we have not been able to manage her anxiety and the typical treatment of SSRIs is likely to cause mood instability.  We could consider the potential of switching out [name] for risperidone in hopes of better antianxiety effects but she has seen antidepressant effect from [name] and is fearful of giving it up.  She does not want medication changes today except as noted even though her anxiety is not managed well.  She does not want to add additional medication because of the polypharmacy going on currently.    Her insomnia is managed  Follow-up 2-3 mos  Meredith Staggers MD, DFAPA   Please see After Visit Summary for patient specific instructions.  No future appointments.  Orders  Placed This Encounter  Procedures  . Lithium level  . TSH  . Basic metabolic panel      -------------------------------

## 2019-07-07 IMAGING — CR DG CHEST 2V
2 series · 2 of 2 positions shown · non-contrast
Comparison: 02/24/2015

CLINICAL DATA: Chest pressure since yesterday.

EXAM:
CHEST  2 VIEW

[w chest pa]
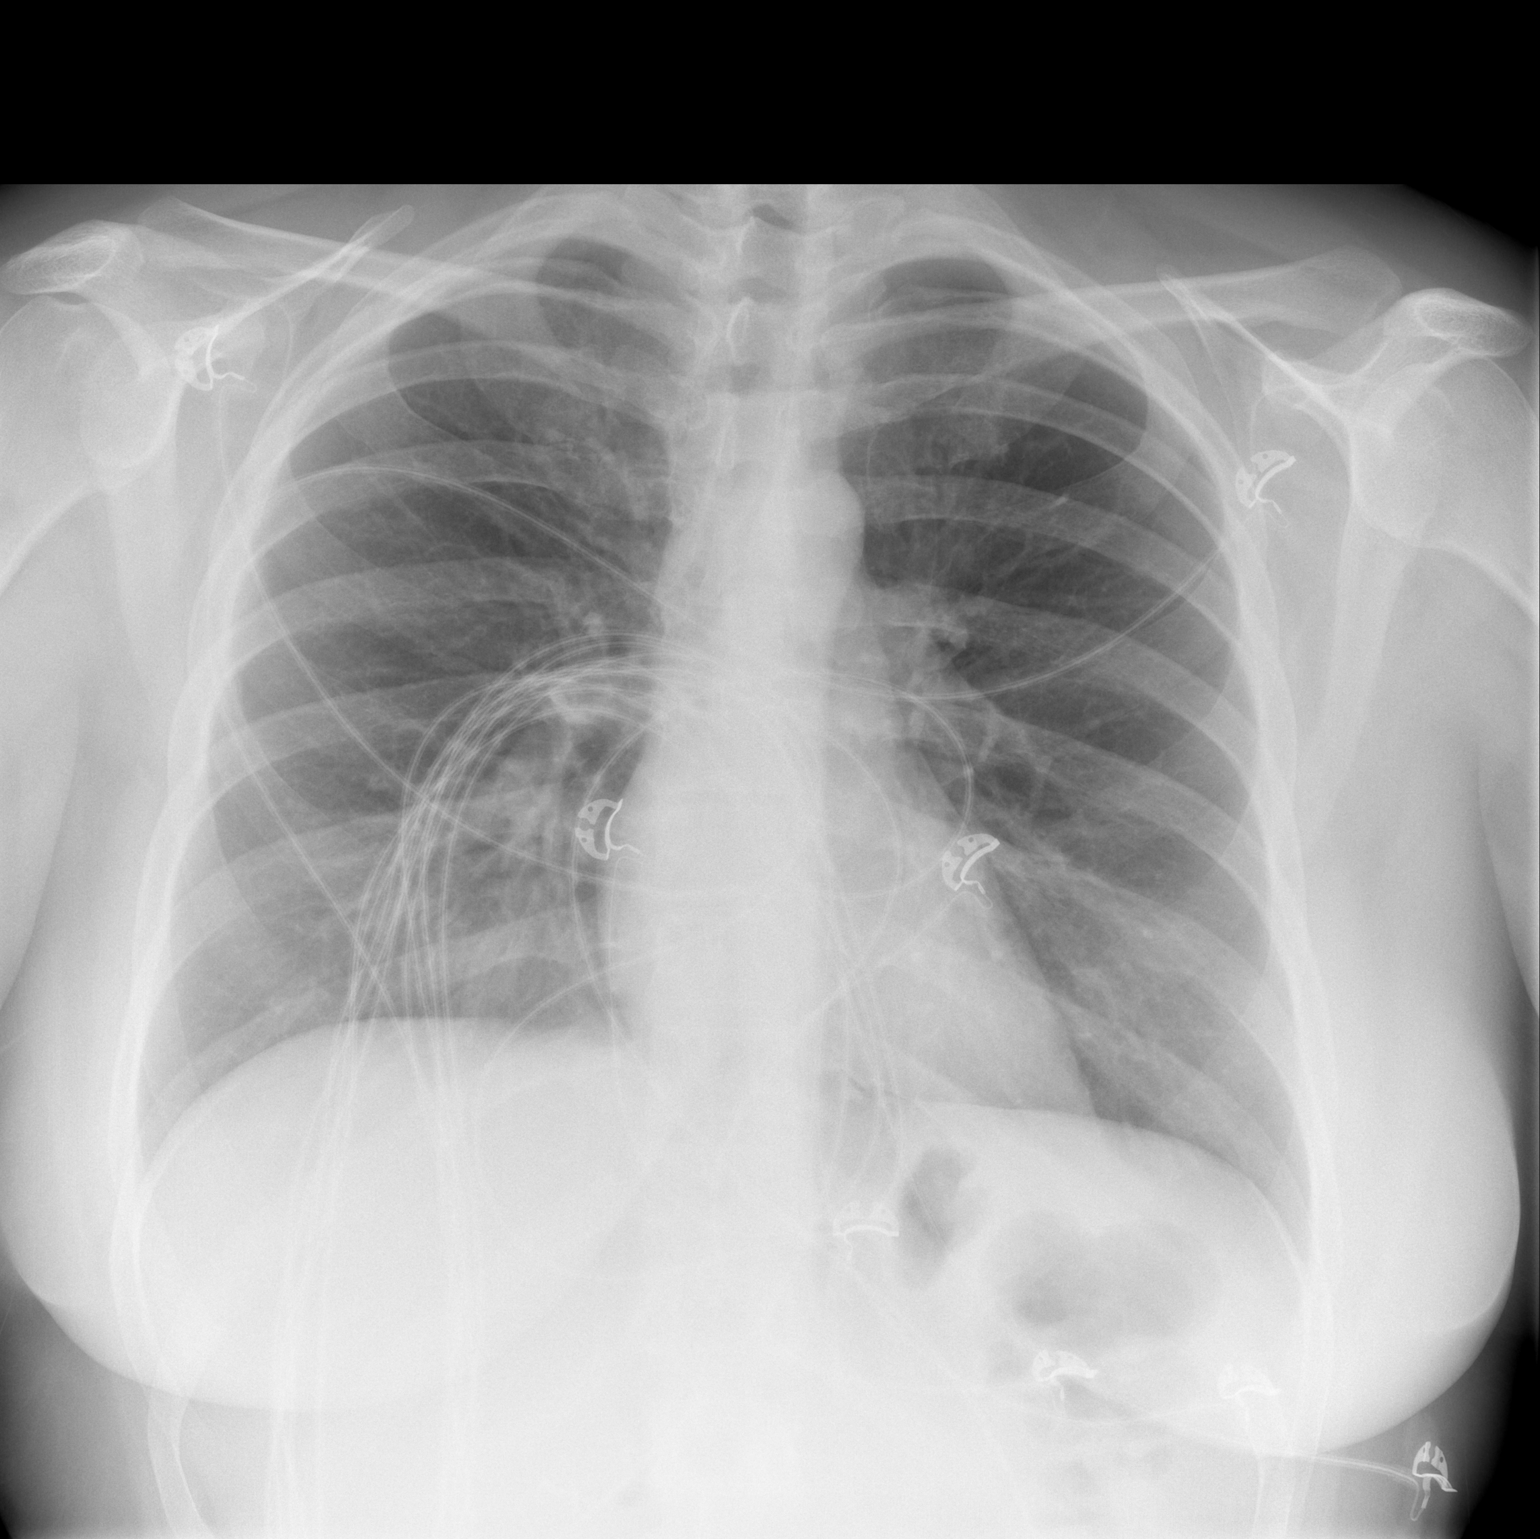

[w chest lat]
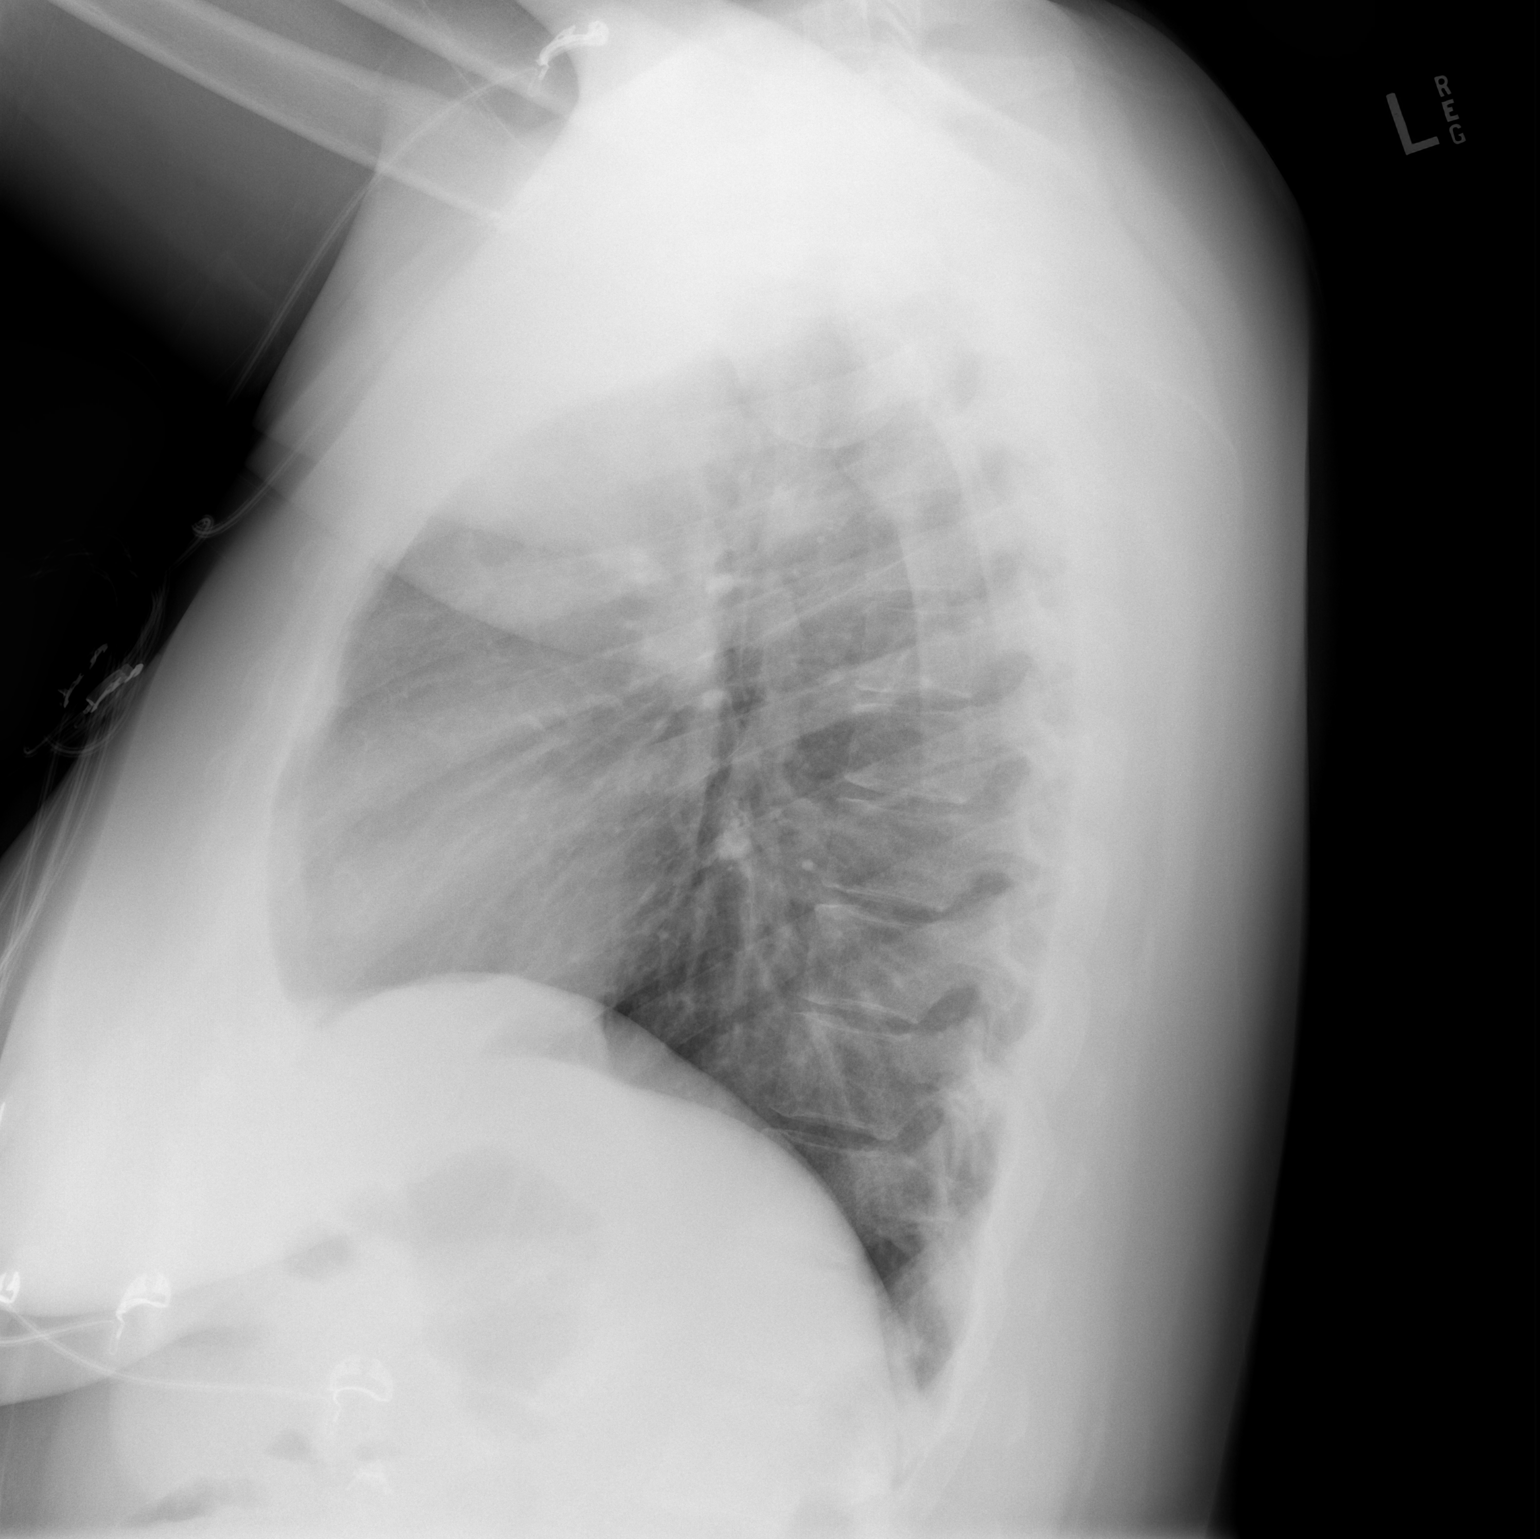

[2 of 2 positions shown; findings below may reference images not displayed]

FINDINGS: The heart size and mediastinal contours are within normal limits.
Both lungs are clear. The visualized skeletal structures are
unremarkable.
IMPRESSION: No active cardiopulmonary disease.

## 2019-07-10 ENCOUNTER — Telehealth: Payer: Self-pay | Admitting: Psychiatry

## 2019-07-10 NOTE — Telephone Encounter (Signed)
Have her come pick up samples to take [name] 120 mg daily for up to a month if we have sufficient quantities of samples.  If not she can pick up part of the samples now and pick up some more later.  We do not want to switch her off [name] if she can meet her deductible and have [name] be affordable in approximately a month or so.

## 2019-07-10 NOTE — Telephone Encounter (Signed)
Pt would like to know if she can wean off of [name]. Please call.

## 2019-07-10 NOTE — Telephone Encounter (Signed)
We have had to do this before and historically weaning her off [name] has led to more depression.  I would encourage her to investigate exactly when she meets deductible to ensure that that she wants to take this risk of worsening depression and mood swings.  If she feels it necessary to wean off the [name] then cut the dosage in half to 60 mg for 2 weeks and then stop it.  If she has the opportunity to do 60 mg for 2 weeks and then 12:45 120 mg tablet for 2 additional weeks that would be ideal.  If her symptoms worsen we could consider loxapine now that it is available again but it is typically not as effective for depression as is [name].

## 2019-07-11 NOTE — Telephone Encounter (Signed)
Patient notified to pick up samples, very appreciative.

## 2019-08-04 ENCOUNTER — Other Ambulatory Visit: Payer: Self-pay | Admitting: Psychiatry

## 2019-08-04 DIAGNOSIS — F3160 Bipolar disorder, current episode mixed, unspecified: Secondary | ICD-10-CM

## 2019-08-23 LAB — BASIC METABOLIC PANEL
BUN: 14 mg/dL (ref 7–25)
CO2: 25 mmol/L (ref 20–32)
Calcium: 9.7 mg/dL (ref 8.6–10.2)
Chloride: 102 mmol/L (ref 98–110)
Creat: 0.85 mg/dL (ref 0.50–1.10)
Glucose, Bld: 156 mg/dL — ABNORMAL HIGH (ref 65–139)
Potassium: 4.9 mmol/L (ref 3.5–5.3)
Sodium: 135 mmol/L (ref 135–146)

## 2019-08-23 LAB — LITHIUM LEVEL: Lithium Lvl: 0.8 mmol/L (ref 0.6–1.2)

## 2019-08-23 LAB — TSH: TSH: 5.1 mIU/L — ABNORMAL HIGH

## 2019-11-05 ENCOUNTER — Ambulatory Visit: Payer: 59 | Admitting: Psychiatry

## 2019-11-20 ENCOUNTER — Other Ambulatory Visit: Payer: Self-pay | Admitting: Psychiatry

## 2019-11-23 ENCOUNTER — Other Ambulatory Visit: Payer: Self-pay

## 2019-11-23 MED ORDER — LITHIUM CARBONATE 300 MG PO CAPS: 1200.0000 mg | ORAL_CAPSULE | Freq: Every day | ORAL | 1 refills | Status: DC

## 2020-01-08 ENCOUNTER — Other Ambulatory Visit: Payer: Self-pay

## 2020-01-08 ENCOUNTER — Encounter: Payer: Self-pay | Admitting: Psychiatry

## 2020-01-08 ENCOUNTER — Ambulatory Visit (INDEPENDENT_AMBULATORY_CARE_PROVIDER_SITE_OTHER): Payer: 59 | Admitting: Psychiatry

## 2020-01-08 DIAGNOSIS — F4001 Agoraphobia with panic disorder: Secondary | ICD-10-CM | POA: Diagnosis not present

## 2020-01-08 DIAGNOSIS — R112 Nausea with vomiting, unspecified: Secondary | ICD-10-CM

## 2020-01-08 DIAGNOSIS — F5105 Insomnia due to other mental disorder: Secondary | ICD-10-CM

## 2020-01-08 DIAGNOSIS — F902 Attention-deficit hyperactivity disorder, combined type: Secondary | ICD-10-CM

## 2020-01-08 DIAGNOSIS — Z79899 Other long term (current) drug therapy: Secondary | ICD-10-CM

## 2020-01-08 DIAGNOSIS — F411 Generalized anxiety disorder: Secondary | ICD-10-CM

## 2020-01-08 DIAGNOSIS — F3160 Bipolar disorder, current episode mixed, unspecified: Secondary | ICD-10-CM | POA: Diagnosis not present

## 2020-01-08 NOTE — Progress Notes (Signed)
Anita Black [id number] 07-Dec-1980 39 y.o.  Virtual Visit via WebEX  I connected with pt by WebEx and verified that I am speaking with the correct person using two identifiers.   I discussed the limitations, risks, security and privacy concerns of performing an evaluation and management service by Anita Black and the availability of in person appointments. I also discussed with the patient that there may be a patient responsible charge related to this service. The patient expressed understanding and agreed to proceed.  I discussed the assessment and treatment plan with the patient. The patient was provided an opportunity to ask questions and all were answered. The patient agreed with the plan and demonstrated an understanding of the instructions.   The patient was advised to call back or seek an in-person evaluation if the symptoms worsen or if the condition fails to improve as anticipated.  I provided 30 minutes of video time during this encounter. The call started at 930 and ended at 10:00. The patient was located at home and the provider was located office.   Subjective:   Patient ID:  Anita Black is a 39 y.o. (DOB March 08, 1981) female.  Chief Complaint:  No chief complaint on file.   Anxiety Symptoms include nervous/anxious behavior. Patient reports no confusion, decreased concentration, dizziness, nausea or suicidal ideas.    Depression        Associated symptoms include fatigue.  Associated symptoms include no decreased concentration and no suicidal ideas.  Past medical history includes anxiety.     Anita Black presents to the office today for follow-up of chronically unstable bipolar disorder and anxiety.  In March 26 , 2020.  She had been switched to [name] and had a good response.   When seen November 29, 2018 which she had reduced [name] on her own to 500 mg daily because of hair loss.  She was cautioned that this would probably not be enough medication to maintain  mood stability but she did not want to make any medicine changes or increase the dose back up she was encouraged to take vitamins which often help with [name] related hair loss eventually.  visit December 12, 2018.  She had stopped [name] on her own due to hair loss and refused to consider taking vitamins to counteract this hair loss despite the fact that the [name] had been effective and she has had a very treatment resistant course with multiple med failures.  She asked about taking [name].  [name] was started at 5 mg a day for 1 week to increase then to 10 mg daily as a mood stabilizer.  She went up quicker.    It's been good.  Less irritable.  No SE.  Not manic.  Not much depression.  No change in chronic anxiety.  Sleep is the same and is OK.  Pretty level.  No worsenging mood sx with switch to [name] 10.  She's aware it is a low bipolar dose but satisfied.    seen January 10, 2019.  She had seen improvement with [name] 10 mg and so no meds were changed. seen May 07, 2019.  She had stopped [name] 3-4 weeks later dT stomach pain which resolved off the meds.  Still taking [name] 120 daily, lamtrogine 200, lithium 900, [name] 60 daily.  We increased lamotrigine to 300 mg daily to try to help with ongoing mood instability.  07/04/19 appt with the followiing noted: No difference with increased lamotrigine to 300 mg.  Emotional outbursts easily triggered.  I got to do something about the outbursts.   Overall ok with a little moodiness.  Asks about increasing lamotrigine back to 200 BID.   Within last week hair loss stopped.   Anxiety still terrible but I still don't want to add anything else bc I'm on so much. Mostly worry vs physical sx. Ex convinced herself that F would get Covid and had to prepare for it.  No pattern to worry thoughts. No obsessions.  No panic attacks.  Sleep is okay.  Not profoundly depressed.  No hx drug abuse or alcohol abuse..    She reduced the lithium to 900 mg  daily bc GI SE.  Somethings causing nausea again.  Higher dosage of lithium with less diarrhea manageable for right now.   Sleep OK. No manic SX.  Didn't realize how unstable she was until feeling stable. Plan: Reduce lamotrigine Back to 200 mg daily.   She agrees to increase the lithium back to 1200 mg daily.  Wait 1 week then get lithium level BMP and TSH   07/10/19 TC with the following noted:  CO having to wean off [name] again DT cost. MD response:We have had to do this before and historically weaning her off Anita Black has led to more depression.  I would encourage her to investigate exactly when she meets deductible to ensure that that she wants to take this risk of worsening depression and mood swings. If she feels it necessary to wean off the [name] then cut the dosage in half to 60 mg for 2 weeks and then stop it.  If she has the opportunity to do 60 mg for 2 weeks and then 12:45 120 mg tablet for 2 additional weeks that would be ideal. If her symptoms worsen we could consider loxapine now that it is available again but it is typically not as effective for depression as is Anita Black.  01/08/20 appt with the following noted: Stopped [name] after the samples.  BC high ded policy   Has been fine off the Anita Black.  Patient reports stable mood and denies depressed moods.  Brief periods of sadness over difficulties with MS.  MS is fairly stable. Still some irritability but usually manageable.  Goes away on her own if feels she'll explode which she does at least 4 times weekly.  Same as on [name].  Patient with chronic manageable anxiety.  Patient denies difficulty with sleep initiation or maintenance. Denies appetite disturbance.  Patient reports that energy and motivation have been good.  Patient denies any difficulty with concentration.  Patient denies any suicidal ideation. Tolerating lithium without GI problems she had in the past.  past psych meds:  [name], lithium 1200, CBZ, Equetro helped but GI  side effects, Trileptal, [name] retrial 10 GI, Rexulti, lamotrigine, [name] 120,  [name] 1500 good resp but hair loss, Seroquel low dosages with weight gain, Zoloft 150, Trintellix, , mirtazapine, Lexapro, doxazosin.  Duloxetine side effects, long history of sertraline, Lexapro headaches, Trintellix,  trazodone, mirtazapine,   Adderall for MS fatigue, [name] for MS fatigue  Review of Systems:  Review of Systems  Constitutional: Positive for fatigue.  Gastrointestinal: Negative for diarrhea, nausea and vomiting.  Skin:       Hair loss  Neurological: Negative for dizziness, tremors and weakness.       Balance problems  Psychiatric/Behavioral: Positive for depression. Negative for agitation, behavioral problems, confusion, decreased concentration, dysphoric mood, hallucinations, self-injury, sleep disturbance and suicidal ideas. The patient is nervous/anxious. The patient is not hyperactive.  Depression resolved  Medications: I have reviewed the patient's current medications.  Current Outpatient Medications  Medication Sig Dispense Refill  . atorvastatin (LIPITOR) 20 MG tablet Take 20 mg by mouth daily.    . BD SAFETYGLIDE SHIELDED NEEDLE 25G X 1" MISC USE AS DIRECTED 10 each 7  . Cholecalciferol (VITAMIN D3) 10000 units TABS Take by mouth.    . lamoTRIgine (LAMICTAL) 100 MG tablet TAKE 1 TABLET BY MOUTH IN THE MORNING AND 1 AT NIGHT 180 tablet 0  . lisdexamfetamine ([name]) 60 MG capsule Take 1 capsule (60 mg total) by mouth every morning. 30 capsule 0  . lithium carbonate 300 MG capsule Take 4 capsules (1,200 mg total) by mouth at bedtime. 360 capsule 1  . metFORMIN (GLUCOPHAGE) 500 MG tablet 1000 mg BID with meals. Needs appt prior to anymore refills. 60 tablet 0  . ocrelizumab (OCREVUS) 300 MG/10ML injection Inject into the vein.    [name] PAZEO 0.7 % SOLN PLACE 1 DROP IN [facility] ONCE A DAY    . rizatriptan (MAXALT-MLT) 10 MG disintegrating tablet Take 1 tablet (10 mg total) by mouth  as needed for migraine. May repeat in 2 hours if needed 9 tablet 11  . SPRINTEC 28 0.25-35 MG-MCG tablet     . valACYclovir (VALTREX) 1000 MG tablet 2000 mg every 12 hours for 2 doses. 8 tablet 3   No current facility-administered medications for this visit.    Medication Side Effects: .diarrhea resolved.  Allergies: No Known Allergies  Past Medical History:  Diagnosis Date  . Anxiety   . Depression   . Diabetes type 2, controlled (HCC)   . Elevated hemoglobin A1c   . Hyperlipidemia   . Migraine   . Multiple sclerosis (HCC)    receives Tysabri infusion q 28 days    Family History  Problem Relation Age of Onset  . Diabetes Mother   . Hypertension Mother   . Dementia Mother   . Bipolar disorder Mother   . Heart failure Father   . Hypertension Father   . Heart disease Father   . Ovarian cancer Maternal Grandmother   . Alzheimer's disease Paternal Grandmother   . Skin cancer Maternal Grandfather     Social History   Socioeconomic History  . Marital status: Married    Spouse name: Rollen Sox  . Number of children: 1  . Years of education: MA  . Highest education level: Not on file  Occupational History    Employer: [employer]  Tobacco Use  . Smoking status: Never Smoker  . Smokeless tobacco: Never Used  Vaping Use  . Vaping Use: Never used  Substance and Sexual Activity  . Alcohol use: Yes    Comment: socially; once a week  . Drug use: No  . Sexual activity: Yes    Birth control/protection: Pill  Other Topics Concern  . Not on file  Social History Narrative   Patient lives at home with family.   Caffeine Use: 1-2 cups daily   No tobacco, recreational drugs. Social drinking only.   Wears her seatbelt, has smoke alarm in the home.   Feels safe in her relationships.   Social Determinants of Health   Financial Resource Strain:   . Difficulty of Paying Living Expenses: Not on file  Food Insecurity:   . Worried About Programme researcher, broadcasting/film/video in the Last  Year: Not on file  . Ran Out of Food in the Last Year: Not on file  Transportation Needs:   .  Lack of Transportation (Medical): Not on file  . Lack of Transportation (Non-Medical): Not on file  Physical Activity:   . Days of Exercise per Week: Not on file  . Minutes of Exercise per Session: Not on file  Stress:   . Feeling of Stress : Not on file  Social Connections:   . Frequency of Communication with Friends and Family: Not on file  . Frequency of Social Gatherings with Friends and Family: Not on file  . Attends Religious Services: Not on file  . Active Member of Clubs or Organizations: Not on file  . Attends Banker Meetings: Not on file  . Marital Status: Not on file  Intimate Partner Violence:   . Fear of Current or Ex-Partner: Not on file  . Emotionally Abused: Not on file  . Physically Abused: Not on file  . Sexually Abused: Not on file    Past Medical History, Surgical history, Social history, and Family history were reviewed and updated as appropriate.   Please see review of systems for further details on the patient's review from today.   Objective:   Physical Exam:  There were no vitals taken for this visit.  Physical Exam Constitutional:      General: She is not in acute distress. Musculoskeletal:        General: No deformity.  Neurological:     Mental Status: She is alert and oriented to person, place, and time.     Cranial Nerves: No dysarthria.     Coordination: Coordination normal.  Psychiatric:        Attention and Perception: Attention and perception normal. She does not perceive auditory or visual hallucinations.        Mood and Affect: Mood is anxious. Mood is not depressed. Affect is not labile, blunt, angry or inappropriate.        Speech: Speech normal.        Behavior: Behavior normal. Behavior is cooperative.        Thought Content: Thought content normal. Thought content is not paranoid or delusional. Thought content does not  include homicidal or suicidal ideation. Thought content does not include homicidal or suicidal plan.        Cognition and Memory: Cognition and memory normal.        Judgment: Judgment normal.     Comments: Insight intact Labile mood including periods of irritability and anxiety mainly but she feels it's manageable.     Lab Review:     Component Value Date/Time   NA 135 08/22/2019 0752   NA 137 04/15/2014 1554   K 4.9 08/22/2019 0752   CL 102 08/22/2019 0752   CO2 25 08/22/2019 0752   GLUCOSE 156 (H) 08/22/2019 0752   BUN 14 08/22/2019 0752   BUN 15 04/15/2014 1554   CREATININE 0.85 08/22/2019 0752   CALCIUM 9.7 08/22/2019 0752   PROT 6.2 (L) 10/22/2016 1009   PROT 6.9 12/29/2015 1400   ALBUMIN 3.3 (L) 10/22/2016 1009   ALBUMIN 4.5 12/29/2015 1400   AST 26 10/22/2016 1009   ALT 25 10/22/2016 1009   ALKPHOS 81 10/22/2016 1009   BILITOT 1.0 10/22/2016 1009   BILITOT 0.6 12/29/2015 1400   GFRNONAA >60 12/25/2016 1254   GFRAA >60 12/25/2016 1254       Component Value Date/Time   WBC 8.1 12/25/2016 1254   RBC 4.34 12/25/2016 1254   HGB 12.0 12/25/2016 1254   HGB 11.9 06/30/2016 1122   HCT 35.8 (L) 12/25/2016  1254   HCT 36.2 06/30/2016 1122   PLT 255 12/25/2016 1254   PLT 237 06/30/2016 1122   MCV 82.5 12/25/2016 1254   MCV 81 06/30/2016 1122   MCH 27.6 12/25/2016 1254   MCHC 33.5 12/25/2016 1254   RDW 14.5 12/25/2016 1254   RDW 14.6 06/30/2016 1122   LYMPHSABS 1.6 10/22/2016 1009   LYMPHSABS 3.6 (H) 06/30/2016 1122   MONOABS 0.3 10/22/2016 1009   EOSABS 0.1 10/22/2016 1009   EOSABS 0.1 06/30/2016 1122   BASOSABS 0.0 10/22/2016 1009   BASOSABS 0.0 06/30/2016 1122    Lithium Lvl  Date Value Ref Range Status  08/22/2019 0.8 0.6 - 1.2 mmol/L Final   08/22/19 lithium after increase to 1200 mg daily.  No results found for: PHENYTOIN, PHENOBARB, VALPROATE, CBMZ   .res Assessment: Plan:     Diagnoses and all orders for this visit:  Bipolar I disorder, most  recent episode mixed (HCC)  Lithium use  Generalized anxiety disorder  Panic disorder with agoraphobia  Attention deficit hyperactivity disorder (ADHD), combined type  Insomnia due to mental condition  Nausea and vomiting, intractability of vomiting not specified, unspecified vomiting type    Greater than 50% of 30-minute non face to face time with patient was spent on counseling and coordination of care. We discussed her chronically unstable bipolar disorder with a history of abrupt worsening of depression with suicidal ideation.  She continues to be unstable.  She is having excessive anger outbursts and mood reactivity over minor incidents the causing distress to her and those around her.  She is not markedly depressed.  The increase in lamotrigine did not help from 200 to 300 mg daily.  She is willing to increase the lithium again and take Imodium to deal with the diarrhea.  Disc option of loxapine to replace [name] in hopes of better control of irritability and anxiety. Disc difficulties and danger of misleading reading about this class of med on the internet.  She agrees to continue the lithium back to 1200 mg daily.   Labs good as noted lithium 0.8 on 1200.  Her anxiety is not well managed but we will not initiate further med changes today because of the side effect issues she has had with so many things were recently.  We may need to further address this in the future.  We could consider benzodiazepines because we have not been able to manage her anxiety and the typical treatment of SSRIs is likely to cause mood instability.  We could consider the potential of switching out [name] for risperidone in hopes of better antianxiety effects but she has seen antidepressant effect from Anita Black and is fearful of giving it up.  She does not want medication changes today except as noted even though her anxiety is not managed well.  She does not want to add additional medication because of the  polypharmacy going on currently.    Her insomnia is managed  Follow-up 6 mos  Meredith Staggers MD, DFAPA   Please see After Visit Summary for patient specific instructions.  No future appointments.  No orders of the defined types were placed in this encounter.     -------------------------------

## 2020-01-08 NOTE — Patient Instructions (Signed)
Option loxapine

## 2020-02-13 ENCOUNTER — Other Ambulatory Visit: Payer: Self-pay | Admitting: Psychiatry

## 2020-02-13 DIAGNOSIS — F3160 Bipolar disorder, current episode mixed, unspecified: Secondary | ICD-10-CM

## 2020-03-20 ENCOUNTER — Other Ambulatory Visit: Payer: Self-pay | Admitting: Psychiatry

## 2020-03-20 ENCOUNTER — Telehealth: Payer: Self-pay | Admitting: Psychiatry

## 2020-03-20 MED ORDER — HYDROXYZINE HCL 10 MG PO TABS: 10.0000 mg | ORAL_TABLET | Freq: Three times a day (TID) | ORAL | 0 refills | Status: DC | PRN

## 2020-03-20 NOTE — Telephone Encounter (Signed)
She is on stimulant [name] so prefer not to use a benzodiazepine.  Sent prescription for hydroxyzine 10 to 20 mg 3 times daily as needed anxiety.  If that is not significantly helpful she needs to schedule an appointment.

## 2020-03-20 NOTE — Telephone Encounter (Signed)
Please review

## 2020-03-20 NOTE — Telephone Encounter (Signed)
Pt is having intense episodes of anxiety. Pt would like to know if she can get something called in. Please send to [facility] in [place].

## 2020-03-24 NOTE — Telephone Encounter (Signed)
Call made to patient. Not able to LM.

## 2020-03-24 NOTE — Telephone Encounter (Signed)
Please let her know I sent in RX for her anxiety and if it doesn't help enough then schedule appt with me

## 2020-04-02 ENCOUNTER — Other Ambulatory Visit: Payer: Self-pay

## 2020-04-02 ENCOUNTER — Encounter: Payer: Self-pay | Admitting: Psychiatry

## 2020-04-02 ENCOUNTER — Ambulatory Visit (INDEPENDENT_AMBULATORY_CARE_PROVIDER_SITE_OTHER): Payer: 59 | Admitting: Psychiatry

## 2020-04-02 DIAGNOSIS — Z79899 Other long term (current) drug therapy: Secondary | ICD-10-CM

## 2020-04-02 DIAGNOSIS — F411 Generalized anxiety disorder: Secondary | ICD-10-CM | POA: Diagnosis not present

## 2020-04-02 DIAGNOSIS — F4001 Agoraphobia with panic disorder: Secondary | ICD-10-CM | POA: Diagnosis not present

## 2020-04-02 DIAGNOSIS — F3160 Bipolar disorder, current episode mixed, unspecified: Secondary | ICD-10-CM

## 2020-04-02 DIAGNOSIS — F902 Attention-deficit hyperactivity disorder, combined type: Secondary | ICD-10-CM | POA: Diagnosis not present

## 2020-04-02 DIAGNOSIS — F5105 Insomnia due to other mental disorder: Secondary | ICD-10-CM

## 2020-04-02 MED ORDER — LATUDA 120 MG PO TABS: 120.0000 mg | ORAL_TABLET | Freq: Every day | ORAL | 0 refills | Status: DC

## 2020-04-02 MED ORDER — CLONAZEPAM 1 MG PO TABS: 1.0000 mg | ORAL_TABLET | Freq: Three times a day (TID) | ORAL | 0 refills | Status: DC | PRN

## 2020-04-02 NOTE — Progress Notes (Signed)
ALIE BALDINGER [id number] 05/24/1980 39 y.o.   Subjective:   Patient ID:  KARLIA SCHLAEFER is a 39 y.o. (DOB June 25, 1980) female.  Chief Complaint:  Chief Complaint  Patient presents with  . Follow-up  . Anxiety  . Depression  . Manic Behavior    Anxiety Symptoms include nervous/anxious behavior. Patient reports no confusion, decreased concentration, dizziness, nausea or suicidal ideas.    Depression        Associated symptoms include fatigue.  Associated symptoms include no decreased concentration and no suicidal ideas.  Past medical history includes anxiety.     [name] presents to the office today for follow-up of chronically unstable bipolar disorder and anxiety.  In March 26 , 2020.  She had been switched to [name] and had a good response.   When seen November 29, 2018 which she had reduced [name] on her own to 500 mg daily because of hair loss.  She was cautioned that this would probably not be enough medication to maintain mood stability but she did not want to make any medicine changes or increase the dose back up she was encouraged to take vitamins which often help with [name] related hair loss eventually.  visit December 12, 2018.  She had stopped [name] on her own due to hair loss and refused to consider taking vitamins to counteract this hair loss despite the fact that the [name] had been effective and she has had a very treatment resistant course with multiple med failures.  She asked about taking [name].  [name] was started at 5 mg a day for 1 week to increase then to 10 mg daily as a mood stabilizer.  She went up quicker.    It's been good.  Less irritable.  No SE.  Not manic.  Not much depression.  No change in chronic anxiety.  Sleep is the same and is OK.  Pretty level.  No worsenging mood sx with switch to [name] 10.  She's aware it is a low bipolar dose but satisfied.    seen January 10, 2019.  She had seen improvement with [name] 10 mg and so no  meds were changed. seen May 07, 2019.  She had stopped [name] 3-4 weeks later dT stomach pain which resolved off the meds.  Still taking [name] 120 daily, lamtrogine 200, lithium 900, [name] 60 daily.  We increased lamotrigine to 300 mg daily to try to help with ongoing mood instability.  07/04/19 appt with the followiing noted: No difference with increased lamotrigine to 300 mg.  Emotional outbursts easily triggered.  I got to do something about the outbursts.   Overall ok with a little moodiness.  Asks about increasing lamotrigine back to 200 BID.   Within last week hair loss stopped.   Anxiety still terrible but I still don't want to add anything else bc I'm on so much. Mostly worry vs physical sx. Ex convinced herself that F would get Covid and had to prepare for it.  No pattern to worry thoughts. No obsessions.  No panic attacks.  Sleep is okay.  Not profoundly depressed.  No hx drug abuse or alcohol abuse..    She reduced the lithium to 900 mg daily bc GI SE.  Somethings causing nausea again.  Higher dosage of lithium with less diarrhea manageable for right now.   Sleep OK. No manic SX.  Didn't realize how unstable she was until feeling stable. Plan: Reduce lamotrigine Back to 200 mg daily.   She  agrees to increase the lithium back to 1200 mg daily.  Wait 1 week then get lithium level BMP and TSH   07/10/19 TC with the following noted:  CO having to wean off [name] again DT cost. MD response:We have had to do this before and historically weaning her off [name] has led to more depression.  I would encourage her to investigate exactly when she meets deductible to ensure that that she wants to take this risk of worsening depression and mood swings. If she feels it necessary to wean off the [name] then cut the dosage in half to 60 mg for 2 weeks and then stop it.  If she has the opportunity to do 60 mg for 2 weeks and then 12:45 120 mg tablet for 2 additional weeks that would be ideal. If  her symptoms worsen we could consider loxapine now that it is available again but it is typically not as effective for depression as is [name].  01/08/20 appt with the following noted: Stopped [name] after the samples.  BC high ded policy   Has been fine off the [name].  Patient reports stable mood and denies depressed moods.  Brief periods of sadness over difficulties with MS.  MS is fairly stable. Still some irritability but usually manageable.  Goes away on her own if feels she'll explode which she does at least 4 times weekly.  Same as on [name].  Patient with chronic manageable anxiety.  Patient denies difficulty with sleep initiation or maintenance. Denies appetite disturbance.  Patient reports that energy and motivation have been good.  Patient denies any difficulty with concentration.  Patient denies any suicidal ideation. Tolerating lithium without GI problems she had in the past. Plan: no med changes. She agrees to continue the lithium back to 1200 mg daily.  Labs good as noted lithium 0.8 on 1200.  04/02/2020 patient called for an urgent appointment today: She called on 03/20/2020 asking for something for anxiety.  Because she was on stimulant we gave her hydroxyzine 10 to 20 mg 3 times daily as needed. Says she doubted bipolar dx and now believes it. "I'm so manic".  Can't stop it.  I'm so mean and I can't stop it.  Tense inside and racing inside. Hydroxyzine didn't help. Recognized mania last week.  Felt high and euphoric.  Not crazy thoughts but impulsive and increased spending of $200 in arcade yesterday.  Is sleeping bc is exhausted.  Chronic MS fatigue.  Hyperactive.  More research online.  Hypomanic is her self dx.  Increased grandiosity of seeing things more clearly.  Impulsive and irritable alternating with euphoria. Can't stop herself. Been consistent with lithium.  Ran out of lamotrigine several weeks ago and forgot to pick it up.  Close friend noticed the changes and saying she  was talking fast. H doesn't believe in mental health problems.  past psych meds:  [name] 1500 good resp but hair loss,, lithium 1200, CBZ, Equetro helped but GI side effects, Trileptal,  lamotrigine, [name] retrial 10 GI, Rexulti, [name] 120,  Seroquel low dosages with weight gain, Zoloft 150, Trintellix, , mirtazapine, Lexapro, doxazosin.  Duloxetine side effects, long history of sertraline, Lexapro headaches, Trintellix,  trazodone, mirtazapine,   Adderall for MS fatigue, [name] for MS fatigue  Review of Systems:  Review of Systems  Constitutional: Positive for fatigue.  Gastrointestinal: Negative for diarrhea, nausea and vomiting.  Skin:       Hair loss  Neurological: Negative for dizziness, tremors and weakness.  Balance problems  Psychiatric/Behavioral: Positive for depression. Negative for agitation, behavioral problems, confusion, decreased concentration, dysphoric mood, hallucinations, self-injury, sleep disturbance and suicidal ideas. The patient is nervous/anxious. The patient is not hyperactive.   Depression resolved  Medications: I have reviewed the patient's current medications.  Current Outpatient Medications  Medication Sig Dispense Refill  . atorvastatin (LIPITOR) 20 MG tablet Take 20 mg by mouth daily.    . BD SAFETYGLIDE SHIELDED NEEDLE 25G X 1" MISC USE AS DIRECTED 10 each 7  . Cholecalciferol (VITAMIN D3) 10000 units TABS Take by mouth.    . hydrOXYzine (ATARAX/VISTARIL) 10 MG tablet Take 1-2 tablets (10-20 mg total) by mouth 3 (three) times daily as needed. 60 tablet 0  . lamoTRIgine (LAMICTAL) 100 MG tablet TAKE 1 TABLET BY MOUTH IN THE MORNING AND 1 AT NIGHT 180 tablet 1  . lisdexamfetamine ([name]) 60 MG capsule Take 1 capsule (60 mg total) by mouth every morning. 30 capsule 0  . lithium carbonate 300 MG capsule Take 4 capsules (1,200 mg total) by mouth at bedtime. 360 capsule 1  . metFORMIN (GLUCOPHAGE) 500 MG tablet 1000 mg BID with meals. Needs appt  prior to anymore refills. 60 tablet 0  . ocrelizumab (OCREVUS) 300 MG/10ML injection Inject into the vein.    [name] PAZEO 0.7 % SOLN PLACE 1 DROP IN [facility] EYE ONCE A DAY    . rizatriptan (MAXALT-MLT) 10 MG disintegrating tablet Take 1 tablet (10 mg total) by mouth as needed for migraine. May repeat in 2 hours if needed 9 tablet 11  . SPRINTEC 28 0.25-35 MG-MCG tablet     . valACYclovir (VALTREX) 1000 MG tablet 2000 mg every 12 hours for 2 doses. 8 tablet 3  . clonazePAM (KLONOPIN) 1 MG tablet Take 1 tablet (1 mg total) by mouth 3 (three) times daily as needed for anxiety. 45 tablet 0  . Lurasidone HCl ([name]) 120 MG TABS Take 1 tablet (120 mg total) by mouth daily. 90 tablet 0   No current facility-administered medications for this visit.    Medication Side Effects: .diarrhea resolved.  Allergies: No Known Allergies  Past Medical History:  Diagnosis Date  . Anxiety   . Depression   . Diabetes type 2, controlled (HCC)   . Elevated hemoglobin A1c   . Hyperlipidemia   . Migraine   . Multiple sclerosis (HCC)    receives Tysabri infusion q 28 days    Family History  Problem Relation Age of Onset  . Diabetes Mother   . Hypertension Mother   . Dementia Mother   . Bipolar disorder Mother   . Heart failure Father   . Hypertension Father   . Heart disease Father   . Ovarian cancer Maternal Grandmother   . Alzheimer's disease Paternal Grandmother   . Skin cancer Maternal Grandfather     Social History   Socioeconomic History  . Marital status: Married    Spouse name: Rollen Sox  . Number of children: 1  . Years of education: MA  . Highest education level: Not on file  Occupational History    Employer: [employer]  Tobacco Use  . Smoking status: Never Smoker  . Smokeless tobacco: Never Used  Vaping Use  . Vaping Use: Never used  Substance and Sexual Activity  . Alcohol use: Yes    Comment: socially; once a week  . Drug use: No  . Sexual activity: Yes    Birth  control/protection: Pill  Other Topics Concern  . Not on  file  Social History Narrative   Patient lives at home with family.   Caffeine Use: 1-2 cups daily   No tobacco, recreational drugs. Social drinking only.   Wears her seatbelt, has smoke alarm in the home.   Feels safe in her relationships.   Social Determinants of Health   Financial Resource Strain:   . Difficulty of Paying Living Expenses: Not on file  Food Insecurity:   . Worried About Programme researcher, broadcasting/film/video in the Last Year: Not on file  . Ran Out of Food in the Last Year: Not on file  Transportation Needs:   . Lack of Transportation (Medical): Not on file  . Lack of Transportation (Non-Medical): Not on file  Physical Activity:   . Days of Exercise per Week: Not on file  . Minutes of Exercise per Session: Not on file  Stress:   . Feeling of Stress : Not on file  Social Connections:   . Frequency of Communication with Friends and Family: Not on file  . Frequency of Social Gatherings with Friends and Family: Not on file  . Attends Religious Services: Not on file  . Active Member of Clubs or Organizations: Not on file  . Attends Banker Meetings: Not on file  . Marital Status: Not on file  Intimate Partner Violence:   . Fear of Current or Ex-Partner: Not on file  . Emotionally Abused: Not on file  . Physically Abused: Not on file  . Sexually Abused: Not on file    Past Medical History, Surgical history, Social history, and Family history were reviewed and updated as appropriate.   Please see review of systems for further details on the patient's review from today.   Objective:   Physical Exam:  There were no vitals taken for this visit.  Physical Exam Constitutional:      General: She is not in acute distress. Musculoskeletal:        General: No deformity.  Neurological:     Mental Status: She is alert and oriented to person, place, and time.     Cranial Nerves: No dysarthria.     Coordination:  Coordination normal.  Psychiatric:        Attention and Perception: Attention and perception normal. She does not perceive auditory or visual hallucinations.        Mood and Affect: Mood is anxious. Mood is not depressed. Affect is tearful. Affect is not labile, blunt, angry or inappropriate.        Speech: Speech is rapid and pressured.        Behavior: Behavior is hyperactive. Behavior is not agitated or aggressive. Behavior is cooperative.        Thought Content: Thought content normal. Thought content is not paranoid or delusional. Thought content does not include homicidal or suicidal ideation. Thought content does not include homicidal or suicidal plan.        Cognition and Memory: Cognition and memory normal.        Judgment: Judgment normal.     Comments: Insight intact      Lab Review:     Component Value Date/Time   NA 135 08/22/2019 0752   NA 137 04/15/2014 1554   K 4.9 08/22/2019 0752   CL 102 08/22/2019 0752   CO2 25 08/22/2019 0752   GLUCOSE 156 (H) 08/22/2019 0752   BUN 14 08/22/2019 0752   BUN 15 04/15/2014 1554   CREATININE 0.85 08/22/2019 0752   CALCIUM 9.7 08/22/2019 0752  PROT 6.2 (L) 10/22/2016 1009   PROT 6.9 12/29/2015 1400   ALBUMIN 3.3 (L) 10/22/2016 1009   ALBUMIN 4.5 12/29/2015 1400   AST 26 10/22/2016 1009   ALT 25 10/22/2016 1009   ALKPHOS 81 10/22/2016 1009   BILITOT 1.0 10/22/2016 1009   BILITOT 0.6 12/29/2015 1400   GFRNONAA >60 12/25/2016 1254   GFRAA >60 12/25/2016 1254       Component Value Date/Time   WBC 8.1 12/25/2016 1254   RBC 4.34 12/25/2016 1254   HGB 12.0 12/25/2016 1254   HGB 11.9 06/30/2016 1122   HCT 35.8 (L) 12/25/2016 1254   HCT 36.2 06/30/2016 1122   PLT 255 12/25/2016 1254   PLT 237 06/30/2016 1122   MCV 82.5 12/25/2016 1254   MCV 81 06/30/2016 1122   MCH 27.6 12/25/2016 1254   MCHC 33.5 12/25/2016 1254   RDW 14.5 12/25/2016 1254   RDW 14.6 06/30/2016 1122   LYMPHSABS 1.6 10/22/2016 1009   LYMPHSABS 3.6 (H)  06/30/2016 1122   MONOABS 0.3 10/22/2016 1009   EOSABS 0.1 10/22/2016 1009   EOSABS 0.1 06/30/2016 1122   BASOSABS 0.0 10/22/2016 1009   BASOSABS 0.0 06/30/2016 1122    Lithium Lvl  Date Value Ref Range Status  08/22/2019 0.8 0.6 - 1.2 mmol/L Final   08/22/19 lithium after increase to 1200 mg daily.  No results found for: PHENYTOIN, PHENOBARB, VALPROATE, CBMZ   .res Assessment: Plan:     Infiniti was seen today for follow-up, anxiety, depression and manic behavior.  Diagnoses and all orders for this visit:  Bipolar I disorder, most recent episode mixed (HCC) -     Lithium level -     Lurasidone HCl ([name]) 120 MG TABS; Take 1 tablet (120 mg total) by mouth daily. -     clonazePAM (KLONOPIN) 1 MG tablet; Take 1 tablet (1 mg total) by mouth 3 (three) times daily as needed for anxiety.  Panic disorder with agoraphobia  Generalized anxiety disorder  Attention deficit hyperactivity disorder (ADHD), combined type  Insomnia due to mental condition  Lithium use    Greater than 50% of 30-minute non face to face time with patient was spent on counseling and coordination of care. We discussed her chronically unstable bipolar disorder with a history of abrupt worsening of depression with suicidal ideation.   Disc dx mania which she had previously denied but now clearly recognizes.  She has a history of taking [name] with benefit but stopped it due to cost.  She believes her insurance will cover it now because she has a different policy.  We will start with 60 mg but increase to 120 mg very quickly because she is likely to need the higher dose to manage the mania.  She agrees to continue the lithium back to 1200 mg daily.  Labs good as noted lithium 0.8 on 1200.  For mania, start [name] 120 mg which she's taken before.  Klonopin 1 mg TID until mania resolves Not ideal to use benzos with the stimulant [name].  She is taking the [name] for MS related to fatigue however the patient  is manic and it is appropriate for short-term use in this situation.  Her insomnia is managed  Follow-up 1 week  Meredith Staggers MD, DFAPA   Please see After Visit Summary for patient specific instructions.  Future Appointments  Date Time Provider Department Center  07/02/2020  9:00 AM Cottle, Steva Ready., MD [facility] None    Orders Placed This Encounter  Procedures  . Lithium level      -------------------------------

## 2020-04-11 ENCOUNTER — Other Ambulatory Visit: Payer: Self-pay

## 2020-04-11 ENCOUNTER — Encounter: Payer: Self-pay | Admitting: Psychiatry

## 2020-04-11 ENCOUNTER — Ambulatory Visit (INDEPENDENT_AMBULATORY_CARE_PROVIDER_SITE_OTHER): Payer: 59 | Admitting: Psychiatry

## 2020-04-11 DIAGNOSIS — R197 Diarrhea, unspecified: Secondary | ICD-10-CM

## 2020-04-11 DIAGNOSIS — F3112 Bipolar disorder, current episode manic without psychotic features, moderate: Secondary | ICD-10-CM

## 2020-04-11 DIAGNOSIS — F4001 Agoraphobia with panic disorder: Secondary | ICD-10-CM | POA: Diagnosis not present

## 2020-04-11 DIAGNOSIS — F5105 Insomnia due to other mental disorder: Secondary | ICD-10-CM

## 2020-04-11 DIAGNOSIS — F411 Generalized anxiety disorder: Secondary | ICD-10-CM

## 2020-04-11 MED ORDER — LATUDA 120 MG PO TABS: 120.0000 mg | ORAL_TABLET | Freq: Every day | ORAL | 0 refills | Status: DC

## 2020-04-11 NOTE — Progress Notes (Signed)
[name] [id number] 1981/01/05 39 y.o.   Subjective:   Patient ID:  Anita Black is a 39 y.o. (DOB 01-09-1981) female.  Chief Complaint:  Chief Complaint  Patient presents with  . Follow-up  . Medication Reaction    diarrhea  . Manic Behavior    Anxiety Symptoms include nervous/anxious behavior. Patient reports no confusion, decreased concentration, dizziness, nausea or suicidal ideas.    Depression        Associated symptoms include fatigue.  Associated symptoms include no decreased concentration and no suicidal ideas.  Past medical history includes anxiety.     Anita Black presents to the office today for follow-up of chronically unstable bipolar disorder and anxiety.  In March 26 , 2020.  She had been switched to [name] and had a good response.   When seen November 29, 2018 which she had reduced [name] on her own to 500 mg daily because of hair loss.  She was cautioned that this would probably not be enough medication to maintain mood stability but she did not want to make any medicine changes or increase the dose back up she was encouraged to take vitamins which often help with [name] related hair loss eventually.  visit December 12, 2018.  She had stopped [name] on her own due to hair loss and refused to consider taking vitamins to counteract this hair loss despite the fact that the [name] had been effective and she has had a very treatment resistant course with multiple med failures.  She asked about taking [name].  [name] was started at 5 mg a day for 1 week to increase then to 10 mg daily as a mood stabilizer.  She went up quicker.    It's been good.  Less irritable.  No SE.  Not manic.  Not much depression.  No change in chronic anxiety.  Sleep is the same and is OK.  Pretty level.  No worsenging mood sx with switch to [name] 10.  She's aware it is a low bipolar dose but satisfied.    seen January 10, 2019.  She had seen improvement with [name] 10 mg  and so no meds were changed. seen May 07, 2019.  She had stopped [name] 3-4 weeks later dT stomach pain which resolved off the meds.  Still taking [name] 120 daily, lamtrogine 200, lithium 900, [name] 60 daily.  We increased lamotrigine to 300 mg daily to try to help with ongoing mood instability.  07/04/19 appt with the followiing noted: No difference with increased lamotrigine to 300 mg.  Emotional outbursts easily triggered.  I got to do something about the outbursts.   Overall ok with a little moodiness.  Asks about increasing lamotrigine back to 200 BID.   Within last week hair loss stopped.   Anxiety still terrible but I still don't want to add anything else bc I'm on so much. Mostly worry vs physical sx. Ex convinced herself that F would get Covid and had to prepare for it.  No pattern to worry thoughts. No obsessions.  No panic attacks.  Sleep is okay.  Not profoundly depressed.  No hx drug abuse or alcohol abuse..    She reduced the lithium to 900 mg daily bc GI SE.  Somethings causing nausea again.  Higher dosage of lithium with less diarrhea manageable for right now.   Sleep OK. No manic SX.  Didn't realize how unstable she was until feeling stable. Plan: Reduce lamotrigine Back to 200 mg daily.  She agrees to increase the lithium back to 1200 mg daily.  Wait 1 week then get lithium level BMP and TSH   07/10/19 TC with the following noted:  CO having to wean off [name] again DT cost. MD response:We have had to do this before and historically weaning her off [name] has led to more depression.  I would encourage her to investigate exactly when she meets deductible to ensure that that she wants to take this risk of worsening depression and mood swings. If she feels it necessary to wean off the [name] then cut the dosage in half to 60 mg for 2 weeks and then stop it.  If she has the opportunity to do 60 mg for 2 weeks and then 12:45 120 mg tablet for 2 additional weeks that would be  ideal. If her symptoms worsen we could consider loxapine now that it is available again but it is typically not as effective for depression as is [name].  01/08/20 appt with the following noted: Stopped [name] after the samples.  BC high ded policy   Has been fine off the [name].  Patient reports stable mood and denies depressed moods.  Brief periods of sadness over difficulties with MS.  MS is fairly stable. Still some irritability but usually manageable.  Goes away on her own if feels she'll explode which she does at least 4 times weekly.  Same as on [name].  Patient with chronic manageable anxiety.  Patient denies difficulty with sleep initiation or maintenance. Denies appetite disturbance.  Patient reports that energy and motivation have been good.  Patient denies any difficulty with concentration.  Patient denies any suicidal ideation. Tolerating lithium without GI problems she had in the past. Plan: no med changes. She agrees to continue the lithium back to 1200 mg daily.  Labs good as noted lithium 0.8 on 1200.  04/02/2020 patient called for an urgent appointment today: She called on 03/20/2020 asking for something for anxiety.  Because she was on stimulant we gave her hydroxyzine 10 to 20 mg 3 times daily as needed. Says she doubted bipolar dx and now believes it. "I'm so manic".  Can't stop it.  I'm so mean and I can't stop it.  Tense inside and racing inside. Hydroxyzine didn't help. Recognized mania last week.  Felt high and euphoric.  Not crazy thoughts but impulsive and increased spending of [place]200 in arc[place] yesterday.  Is sleeping bc is exhausted.  Chronic MS fatigue.  Hyperactive.  More research online.  Hypomanic is her self dx.  Increased grandiosity of seeing things more clearly.  Impulsive and irritable alternating with euphoria. Can't stop herself. Been consistent with lithium.  Ran out of lamotrigine several weeks ago and forgot to pick it up.  Close friend noticed the changes and  saying she was talking fast. H doesn't believe in mental health problems. Plan: Disc dx mania which she had previously denied but now clearly recognizes.  She has a history of taking [name] with benefit but stopped it due to cost.  She believes her insurance will cover it now because she has a different policy.  We will start with 60 mg but increase to 120 mg very quickly because she is likely to need the higher dose to manage the mania. She agrees to continue the lithium back to 1200 mg daily.  Labs good as noted lithium 0.8 on 1200. For mania, start [name] 120 mg which she's taken before. Klonopin 1 mg TID until mania resolves  04/11/2020 appointment with the following noted: Seen urgently to follow-up recent mania.  Seen as a work in appointment. I feel much better. Mania more controlled but still feels it.  Arm had been numb from anxiety and it's better but not gone.  Not racing as much.  Mind is clearer.  Still a little distractible but less.  Still feels a little too upbeat. Extreme diarrhea and lost 8# in the week.  Only change was [name].  The first 4-5 nights was sedated but not last night. Doesn't like clonazepam bc very drowsy with it and only takes it when arm goes numb.  Taking only 1 every 2 days.  past psych meds:  [name] 1500 good resp but hair loss,, lithium 1200, CBZ, Equetro helped but GI side effects, Trileptal,  lamotrigine, [name] retrial 10 GI, Rexulti, [name] 120,  Seroquel low dosages with weight gain, Zoloft 150, Trintellix, , mirtazapine, Lexapro, doxazosin.  Duloxetine side effects, long history of sertraline, Lexapro headaches, Trintellix,  trazodone, mirtazapine,   Adderall for MS fatigue, [name] for MS fatigue  Review of Systems:  Review of Systems  Constitutional: Positive for fatigue.  Gastrointestinal: Positive for diarrhea. Negative for nausea and vomiting.  Skin:       Hair loss  Neurological: Negative for dizziness, tremors and weakness.        Balance problems  Psychiatric/Behavioral: Positive for depression. Negative for agitation, behavioral problems, confusion, decreased concentration, dysphoric mood, hallucinations, self-injury, sleep disturbance and suicidal ideas. The patient is nervous/anxious. The patient is not hyperactive.   Depression resolved  Medications: I have reviewed the patient's current medications.  Current Outpatient Medications  Medication Sig Dispense Refill  . atorvastatin (LIPITOR) 20 MG tablet Take 20 mg by mouth daily.    . BD SAFETYGLIDE SHIELDED NEEDLE 25G X 1" MISC USE AS DIRECTED 10 each 7  . Cholecalciferol (VITAMIN D3) 10000 units TABS Take by mouth.    . clonazePAM (KLONOPIN) 1 MG tablet Take 1 tablet (1 mg total) by mouth 3 (three) times daily as needed for anxiety. 45 tablet 0  . lamoTRIgine (LAMICTAL) 100 MG tablet TAKE 1 TABLET BY MOUTH IN THE MORNING AND 1 AT NIGHT 180 tablet 1  . lisdexamfetamine ([name]) 60 MG capsule Take 1 capsule (60 mg total) by mouth every morning. 30 capsule 0  . lithium carbonate 300 MG capsule Take 4 capsules (1,200 mg total) by mouth at bedtime. 360 capsule 1  . Lurasidone HCl ([name]) 120 MG TABS Take 1 tablet (120 mg total) by mouth daily. 90 tablet 0  . metFORMIN (GLUCOPHAGE) 500 MG tablet 1000 mg BID with meals. Needs appt prior to anymore refills. 60 tablet 0  . ocrelizumab (OCREVUS) 300 MG/10ML injection Inject into the vein.    [name] PAZEO 0.7 % SOLN PLACE 1 DROP IN [facility] EYE ONCE A DAY    . rizatriptan (MAXALT-MLT) 10 MG disintegrating tablet Take 1 tablet (10 mg total) by mouth as needed for migraine. May repeat in 2 hours if needed 9 tablet 11  . SPRINTEC 28 0.25-35 MG-MCG tablet     . valACYclovir (VALTREX) 1000 MG tablet 2000 mg every 12 hours for 2 doses. 8 tablet 3   No current facility-administered medications for this visit.    Medication Side Effects: .diarrhea resolved.  Allergies: No Known Allergies  Past Medical History:  Diagnosis Date  .  Anxiety   . Depression   . Diabetes type 2, controlled (HCC)   . Elevated hemoglobin A1c   .  Hyperlipidemia   . Migraine   . Multiple sclerosis (HCC)    receives Tysabri infusion q 28 days    Family History  Problem Relation Age of Onset  . Diabetes Mother   . Hypertension Mother   . Dementia Mother   . Bipolar disorder Mother   . Heart failure Father   . Hypertension Father   . Heart disease Father   . Ovarian cancer Maternal Grandmother   . Alzheimer's disease Paternal Grandmother   . Skin cancer Maternal Grandfather     Social History   Socioeconomic History  . Marital status: Married    Spouse name: Rollen Sox  . Number of children: 1  . Years of education: MA  . Highest education level: Not on file  Occupational History    Employer: [employer]  Tobacco Use  . Smoking status: Never Smoker  . Smokeless tobacco: Never Used  Vaping Use  . Vaping Use: Never used  Substance and Sexual Activity  . Alcohol use: Yes    Comment: socially; once a week  . Drug use: No  . Sexual activity: Yes    Birth control/protection: Pill  Other Topics Concern  . Not on file  Social History Narrative   Patient lives at home with family.   Caffeine Use: 1-2 cups daily   No tobacco, recreational drugs. Social drinking only.   Wears her seatbelt, has smoke alarm in the home.   Feels safe in her relationships.   Social Determinants of Health   Financial Resource Strain:   . Difficulty of Paying Living Expenses: Not on file  Food Insecurity:   . Worried About Programme researcher, broadcasting/film/video in the Last Year: Not on file  . Ran Out of Food in the Last Year: Not on file  Transportation Needs:   . Lack of Transportation (Medical): Not on file  . Lack of Transportation (Non-Medical): Not on file  Physical Activity:   . Days of Exercise per Week: Not on file  . Minutes of Exercise per Session: Not on file  Stress:   . Feeling of Stress : Not on file  Social Connections:   . Frequency of  Communication with Friends and Family: Not on file  . Frequency of Social Gatherings with Friends and Family: Not on file  . Attends Religious Services: Not on file  . Active Member of Clubs or Organizations: Not on file  . Attends Banker Meetings: Not on file  . Marital Status: Not on file  Intimate Partner Violence:   . Fear of Current or Ex-Partner: Not on file  . Emotionally Abused: Not on file  . Physically Abused: Not on file  . Sexually Abused: Not on file    Past Medical History, Surgical history, Social history, and Family history were reviewed and updated as appropriate.   Please see review of systems for further details on the patient's review from today.   Objective:   Physical Exam:  There were no vitals taken for this visit.  Physical Exam Constitutional:      General: She is not in acute distress. Musculoskeletal:        General: No deformity.  Neurological:     Mental Status: She is alert and oriented to person, place, and time.     Cranial Nerves: No dysarthria.     Coordination: Coordination normal.  Psychiatric:        Attention and Perception: Attention and perception normal. She does not perceive auditory or visual  hallucinations.        Mood and Affect: Mood is anxious. Mood is not depressed. Affect is tearful. Affect is not labile, blunt, angry or inappropriate.        Speech: Speech is rapid and pressured.        Behavior: Behavior is hyperactive. Behavior is not agitated or aggressive. Behavior is cooperative.        Thought Content: Thought content normal. Thought content is not paranoid or delusional. Thought content does not include homicidal or suicidal ideation. Thought content does not include homicidal or suicidal plan.        Cognition and Memory: Cognition and memory normal.        Judgment: Judgment normal.     Comments: Insight intact      Lab Review:     Component Value Date/Time   NA 135 08/22/2019 0752   NA 137  04/15/2014 1554   K 4.9 08/22/2019 0752   CL 102 08/22/2019 0752   CO2 25 08/22/2019 0752   GLUCOSE 156 (H) 08/22/2019 0752   BUN 14 08/22/2019 0752   BUN 15 04/15/2014 1554   CREATININE 0.85 08/22/2019 0752   CALCIUM 9.7 08/22/2019 0752   PROT 6.2 (L) 10/22/2016 1009   PROT 6.9 12/29/2015 1400   ALBUMIN 3.3 (L) 10/22/2016 1009   ALBUMIN 4.5 12/29/2015 1400   AST 26 10/22/2016 1009   ALT 25 10/22/2016 1009   ALKPHOS 81 10/22/2016 1009   BILITOT 1.0 10/22/2016 1009   BILITOT 0.6 12/29/2015 1400   GFRNONAA >60 12/25/2016 1254   GFRAA >60 12/25/2016 1254       Component Value Date/Time   WBC 8.1 12/25/2016 1254   RBC 4.34 12/25/2016 1254   HGB 12.0 12/25/2016 1254   HGB 11.9 06/30/2016 1122   HCT 35.8 (L) 12/25/2016 1254   HCT 36.2 06/30/2016 1122   PLT 255 12/25/2016 1254   PLT 237 06/30/2016 1122   MCV 82.5 12/25/2016 1254   MCV 81 06/30/2016 1122   MCH 27.6 12/25/2016 1254   MCHC 33.5 12/25/2016 1254   RDW 14.5 12/25/2016 1254   RDW 14.6 06/30/2016 1122   LYMPHSABS 1.6 10/22/2016 1009   LYMPHSABS 3.6 (H) 06/30/2016 1122   MONOABS 0.3 10/22/2016 1009   EOSABS 0.1 10/22/2016 1009   EOSABS 0.1 06/30/2016 1122   BASOSABS 0.0 10/22/2016 1009   BASOSABS 0.0 06/30/2016 1122    Lithium Lvl  Date Value Ref Range Status  08/22/2019 0.8 0.6 - 1.2 mmol/L Final   08/22/19 lithium after increase to 1200 mg daily.  No results found for: PHENYTOIN, PHENOBARB, VALPROATE, CBMZ   .res Assessment: Plan:     Rasheda was seen today for follow-up, medication reaction and manic behavior.  Diagnoses and all orders for this visit:  Bipolar 1 disorder, manic, moderate (HCC) -     Lurasidone HCl ([name]) 120 MG TABS; Take 1 tablet (120 mg total) by mouth daily.  Panic disorder with agoraphobia  Diarrhea, unspecified type  Generalized anxiety disorder  Insomnia due to mental condition    Greater than 50% of 30-minute non face to face time with patient was spent on  counseling and coordination of care. We discussed her chronically unstable bipolar disorder with a history of abrupt worsening of depression with suicidal ideation.   Disc dx mania which she had previously denied but now clearly recognizes.  She has a history of taking [name] with benefit but stopped it due to cost.  She believes her insurance  will cover it now because she has a different policy.  We will start with 60 mg but increase to 120 mg very quickly because she is likely to need the higher dose to manage the mania.  She agrees to continue the lithium back to 1200 mg daily.  Labs good as noted lithium 0.8 on 1200.  For mania, continue  [name] 120 mg which she's taken before.  It's helping. Start immodium prn  For diarrhea. Expect diarrhea will resolve as patient gets more used to the [name].  She started right out at a high dose which is unusual.  Klonopin 1 mg TID prn  until mania resolves.  Good that she does not use much bc of [name]. Not ideal to use benzos with the stimulant [name].  She is taking the [name] for MS related to fatigue however the patient is manic and it is appropriate for short-term use in this situation.  Protect against dehydration due to diarrhea.  She lost 8 pounds in the last week.  Drink plenty of fluids to prevent orthostatic hypotension.  Her insomnia is managed  Follow-up 1 month and call if sx don't continue to improve  Meredith Staggers MD, DFAPA   Please see After Visit Summary for patient specific instructions.  Future Appointments  Date Time Provider Department Center  07/02/2020  9:00 AM Cottle, Steva Ready., MD [facility] None    No orders of the defined types were placed in this encounter.     -------------------------------

## 2020-04-16 ENCOUNTER — Telehealth: Payer: Self-pay | Admitting: Psychiatry

## 2020-04-16 ENCOUNTER — Other Ambulatory Visit: Payer: Self-pay | Admitting: Psychiatry

## 2020-04-16 DIAGNOSIS — F3112 Bipolar disorder, current episode manic without psychotic features, moderate: Secondary | ICD-10-CM

## 2020-04-16 MED ORDER — HALOPERIDOL 5 MG PO TABS: 5.0000 mg | ORAL_TABLET | Freq: Every evening | ORAL | 0 refills | Status: DC

## 2020-04-16 MED ORDER — LORAZEPAM 1 MG PO TABS: 1.0000 mg | ORAL_TABLET | Freq: Four times a day (QID) | ORAL | 0 refills | Status: DC | PRN

## 2020-04-16 NOTE — Telephone Encounter (Signed)
Agreed.  Thank you

## 2020-04-16 NOTE — Telephone Encounter (Signed)
She recently switched back to [name] 120 mg which is so far not managing the mania.  She continues on lithium.  The clonazepam is not making her heart race.  That is caused by the mania.  However I know she does not like the clonazepam so I will switch it to lorazepam 1 mg every 6 hours as needed agitation or insomnia.  To better control the mania we will temporarily add haloperidol 5 mg nightly.  She is always concerned about weight gain and haloperidol does not have a significant risk of headache weight gain and is a good antimanic med.  Once the mania is controlled for a week or 2 then she can stop the haloperidol.  Prescription sent in

## 2020-04-16 NOTE — Telephone Encounter (Signed)
Please review

## 2020-04-16 NOTE — Telephone Encounter (Signed)
Rtc to patient and discussed her changes. She is very hesitant about trying the haloperidol but I explained the short term use and it will also help her sleep, and slow down her thoughts. She agreed to try. She was concerned about being too groggy in the morning, informed her it is a short acting so she should not feel those effects. Discussed the option of 1/2 tablet if too much and advised her to call back tomorrow if having any further concerns. She has been having trouble sleeping. Her speech was rapid on the phone and she was getting words twisted. She reports she's not sure if she's ever been manic but she feels like she is now. Very appreciative of discussing things.

## 2020-04-16 NOTE — Telephone Encounter (Signed)
Pt called and said that she is in the middle of a maniac state. She said that she is taking latuda and klonopin. However the klonopin is making her heart race and she is unable to rest. She has tried cutting it in half and still the same. She would like something else called into her pharmacy so she can rest. Please call [phone]

## 2020-05-22 ENCOUNTER — Other Ambulatory Visit: Payer: Self-pay | Admitting: Psychiatry

## 2020-07-02 ENCOUNTER — Encounter: Payer: Self-pay | Admitting: Psychiatry

## 2020-07-02 ENCOUNTER — Ambulatory Visit (INDEPENDENT_AMBULATORY_CARE_PROVIDER_SITE_OTHER): Payer: Medicare Other | Admitting: Psychiatry

## 2020-07-02 ENCOUNTER — Other Ambulatory Visit: Payer: Self-pay

## 2020-07-02 VITALS — BP 115/76 | HR 78

## 2020-07-02 DIAGNOSIS — F902 Attention-deficit hyperactivity disorder, combined type: Secondary | ICD-10-CM

## 2020-07-02 DIAGNOSIS — F411 Generalized anxiety disorder: Secondary | ICD-10-CM | POA: Diagnosis not present

## 2020-07-02 DIAGNOSIS — F5105 Insomnia due to other mental disorder: Secondary | ICD-10-CM

## 2020-07-02 DIAGNOSIS — F3112 Bipolar disorder, current episode manic without psychotic features, moderate: Secondary | ICD-10-CM

## 2020-07-02 DIAGNOSIS — R197 Diarrhea, unspecified: Secondary | ICD-10-CM

## 2020-07-02 DIAGNOSIS — F4001 Agoraphobia with panic disorder: Secondary | ICD-10-CM | POA: Diagnosis not present

## 2020-07-02 DIAGNOSIS — Z79899 Other long term (current) drug therapy: Secondary | ICD-10-CM

## 2020-07-02 MED ORDER — FLUVOXAMINE MALEATE 50 MG PO TABS: 50.0000 mg | ORAL_TABLET | Freq: Every day | ORAL | 1 refills | Status: DC

## 2020-07-02 NOTE — Progress Notes (Signed)
Anita Black [id number] [birth year] 40 y.o.   Subjective:   Patient ID:  Anita Black is a 40 y.o. (DOB 1981-02-12) female.  Chief Complaint:  Chief Complaint  Patient presents with  . Follow-up  . Bipolar 1 disorder, manic, moderate (HCC)  . Manic Behavior    Anxiety Symptoms include nervous/anxious behavior. Patient reports no confusion, decreased concentration, dizziness, nausea or suicidal ideas.    Depression        Associated symptoms include fatigue.  Associated symptoms include no decreased concentration and no suicidal ideas.  Past medical history includes anxiety.     Anita Black presents to the office today for follow-up of chronically unstable bipolar disorder and anxiety.  In March 26 , 2020.  She had been switched to [name] and had a good response.   When seen November 29, 2018 which she had reduced [name] on her own to 500 mg daily because of hair loss.  She was cautioned that this would probably not be enough medication to maintain mood stability but she did not want to make any medicine changes or increase the dose back up she was encouraged to take vitamins which often help with [name] related hair loss eventually.  visit December 12, 2018.  She had stopped [name] on her own due to hair loss and refused to consider taking vitamins to counteract this hair loss despite the fact that the [name] had been effective and she has had a very treatment resistant course with multiple med failures.  She asked about taking [name].  [name] was started at 5 mg a day for 1 week to increase then to 10 mg daily as a mood stabilizer.  She went up quicker.    It's been good.  Less irritable.  No SE.  Not manic.  Not much depression.  No change in chronic anxiety.  Sleep is the same and is OK.  Pretty level.  No worsenging mood sx with switch to [name] 10.  She's aware it is a low bipolar dose but satisfied.    seen January 10, 2019.  She had seen improvement with  [name] 10 mg and so no meds were changed. seen May 07, 2019.  She had stopped [name] 3-4 weeks later dT stomach pain which resolved off the meds.  Still taking [name] 120 daily, lamtrogine 200, lithium 900, [name] 60 daily.  We increased lamotrigine to 300 mg daily to try to help with ongoing mood instability.  07/04/19 appt with the followiing noted: No difference with increased lamotrigine to 300 mg.  Emotional outbursts easily triggered.  I got to do something about the outbursts.   Overall ok with a little moodiness.  Asks about increasing lamotrigine back to 200 BID.   Within last week hair loss stopped.   Anxiety still terrible but I still don't want to add anything else bc I'm on so much. Mostly worry vs physical sx. Ex convinced herself that F would get Covid and had to prepare for it.  No pattern to worry thoughts. No obsessions.  No panic attacks.  Sleep is okay.  Not profoundly depressed.  No hx drug abuse or alcohol abuse..    She reduced the lithium to 900 mg daily bc GI SE.  Somethings causing nausea again.  Higher dosage of lithium with less diarrhea manageable for right now.   Sleep OK. No manic SX.  Didn't realize how unstable she was until feeling stable. Plan: Reduce lamotrigine Back to 200 mg daily.  She agrees to increase the lithium back to 1200 mg daily.  Wait 1 week then get lithium level BMP and TSH   07/10/19 TC with the following noted:  CO having to wean off [name] again DT cost. MD response:We have had to do this before and historically weaning her off Anita KnudsenLatuda has led to more depression.  I would encourage her to investigate exactly when she meets deductible to ensure that that she wants to take this risk of worsening depression and mood swings. If she feels it necessary to wean off the [name] then cut the dosage in half to 60 mg for 2 weeks and then stop it.  If she has the opportunity to do 60 mg for 2 weeks and then 12:45 120 mg tablet for 2 additional weeks  that would be ideal. If her symptoms worsen we could consider loxapine now that it is available again but it is typically not as effective for depression as is Anita Black.  01/08/20 appt with the following noted: Stopped [name] after the samples.  BC high ded policy   Has been fine off the Anita Black.  Patient reports stable mood and denies depressed moods.  Brief periods of sadness over difficulties with MS.  MS is fairly stable. Still some irritability but usually manageable.  Goes away on her own if feels she'll explode which she does at least 4 times weekly.  Same as on [name].  Patient with chronic manageable anxiety.  Patient denies difficulty with sleep initiation or maintenance. Denies appetite disturbance.  Patient reports that energy and motivation have been good.  Patient denies any difficulty with concentration.  Patient denies any suicidal ideation. Tolerating lithium without GI problems she had in the past. Plan: no med changes. She agrees to continue the lithium back to 1200 mg daily.  Labs good as noted lithium 0.8 on 1200.  04/02/2020 patient called for an urgent appointment today: She called on 03/20/2020 asking for something for anxiety.  Because she was on stimulant we gave her hydroxyzine 10 to 20 mg 3 times daily as needed. Says she doubted bipolar dx and now believes it. "I'm so manic".  Can't stop it.  I'm so mean and I can't stop it.  Tense inside and racing inside. Hydroxyzine didn't help. Recognized mania last week.  Felt high and euphoric.  Not crazy thoughts but impulsive and increased spending of [place]200 in arc[place] yesterday.  Is sleeping bc is exhausted.  Chronic MS fatigue.  Hyperactive.  More research online.  Hypomanic is her self dx.  Increased grandiosity of seeing things more clearly.  Impulsive and irritable alternating with euphoria. Can't stop herself. Been consistent with lithium.  Ran out of lamotrigine several weeks ago and forgot to pick it up.  Close friend noticed  the changes and saying she was talking fast. H doesn't believe in mental health problems. Plan: Disc dx mania which she had previously denied but now clearly recognizes.  She has a history of taking [name] with benefit but stopped it due to cost.  She believes her insurance will cover it now because she has a different policy.  We will start with 60 mg but increase to 120 mg very quickly because she is likely to need the higher dose to manage the mania. She agrees to continue the lithium back to 1200 mg daily.  Labs good as noted lithium 0.8 on 1200. For mania, start [name] 120 mg which she's taken before. Klonopin 1 mg TID until mania resolves  04/11/2020 appointment with the following noted: Seen urgently to follow-up recent mania.  Seen as a work in appointment. I feel much better. Mania more controlled but still feels it.  Arm had been numb from anxiety and it's better but not gone.  Not racing as much.  Mind is clearer.  Still a little distractible but less.  Still feels a little too upbeat. Extreme diarrhea and lost 8# in the week.  Only change was [name].  The first 4-5 nights was sedated but not last night. Doesn't like clonazepam bc very drowsy with it and only takes it when arm goes numb.  Taking only 1 every 2 days. She agrees to continue the lithium back to 1200 mg daily.  Labs good as noted lithium 0.8 on 1200. For mania, continue  [name] 120 mg which she's taken before.  It's helping. Start immodium prn  For diarrhea. Expect diarrhea will resolve as patient gets more used to the [name].  She started right out at a high dose which is unusual. Klonopin 1 mg TID prn  until mania resolves.  Good that she does not use much bc of [name].   04/16/20 TC's. Pt called and said that she is in the middle of a maniac state. She said that she is taking latuda and klonopin. However the klonopin is making her heart race and she is unable to rest. She has tried cutting it in half and still the same.  She would like something else called into her pharmacy so she can rest She recently switched back to [name] 120 mg which is so far not managing the mania.  She continues on lithium. The clonazepam is not making her heart race.  That is caused by the mania.  However I know she does not like the clonazepam so I will switch it to lorazepam 1 mg every 6 hours as needed agitation or insomnia. To better control the mania we will temporarily add haloperidol 5 mg nightly.  She is always concerned about weight gain and haloperidol does not have a significant risk of headache weight gain and is a good antimanic med.  Once the mania is controlled for a week or 2 then she can stop the haloperidol.  07/02/20 appt noted: Took Haldol 5 mg for a week and it calmed down and stopped it. It caused drowsiness. Manic sx resolved but still real irritable.  Insurance covering [place] so far.  Threw clonazepam away bc didn't seem to help.  Took lorazepam 1 mg and it seemed to have better calming effect.  Irritability in sudden bursts of 30 min and then resolves.  Cannot control it well.  Kind of weepy too.   EFA.  Sleep 7 hours.  Good function. On lithium 1200, [name] 120, [name] 60. Off lamotrigine since October.  GI rx colestipol and diarrhea stopped.  Took Ritalin 10 days without [name] and irritability without change. Tolerating medications  past psych meds:  [name] 1500 good resp but hair loss,, lithium 1200, CBZ, Equetro helped but GI side effects, Trileptal,  lamotrigine, [name] retrial 10 GI, Rexulti, [name] 120,  Seroquel low dosages with weight gain, Zoloft 150, Trintellix, , mirtazapine, Lexapro, doxazosin.  Duloxetine side effects, long history of sertraline, Lexapro headaches, Trintellix,  trazodone, mirtazapine,   Clonazepam less effective than lorazepam for calming affect Adderall for MS fatigue, [name] for MS fatigue, armodafinil NR, Ritalin NR 20 BID without [name]  Review of Systems:  Review  of Systems  Constitutional: Positive for fatigue and  unexpected weight change.  Gastrointestinal: Negative for diarrhea, nausea and vomiting.  Skin:       Hair loss  Neurological: Negative for dizziness, tremors and weakness.       Balance problems  Psychiatric/Behavioral: Positive for depression. Negative for agitation, behavioral problems, confusion, decreased concentration, dysphoric mood, hallucinations, self-injury, sleep disturbance and suicidal ideas. The patient is nervous/anxious. The patient is not hyperactive.   Depression resolved  Medications: I have reviewed the patient's current medications.  Current Outpatient Medications  Medication Sig Dispense Refill  . atorvastatin (LIPITOR) 20 MG tablet Take 20 mg by mouth daily.    . Cholecalciferol (VITAMIN D3) 10000 units TABS Take by mouth.    . colestipol (COLESTID) 1 g tablet Take 2 g by mouth daily.    . fluvoxaMINE (LUVOX) 50 MG tablet Take 1 tablet (50 mg total) by mouth at bedtime. 30 tablet 1  . lisdexamfetamine ([name]) 60 MG capsule Take 1 capsule (60 mg total) by mouth every morning. 30 capsule 0  . lithium carbonate 300 MG capsule TAKE 4 CAPSULES (1,200 MG TOTAL) BY MOUTH AT BEDTIME. 360 capsule 1  . Lurasidone HCl ([name]) 120 MG TABS Take 1 tablet (120 mg total) by mouth daily. 90 tablet 0  . metFORMIN (GLUCOPHAGE) 500 MG tablet 1000 mg BID with meals. Needs appt prior to anymore refills. 60 tablet 0  . ocrelizumab (OCREVUS) 300 MG/10ML injection Inject into the vein.    . rizatriptan (MAXALT-MLT) 10 MG disintegrating tablet Take 1 tablet (10 mg total) by mouth as needed for migraine. May repeat in 2 hours if needed 9 tablet 11  . SPRINTEC 28 0.25-35 MG-MCG tablet     . valACYclovir (VALTREX) 1000 MG tablet 2000 mg every 12 hours for 2 doses. 8 tablet 3  . BD SAFETYGLIDE SHIELDED NEEDLE 25G X 1" MISC USE AS DIRECTED (Patient not taking: Reported on 07/02/2020) 10 each 7  . haloperidol (HALDOL) 5 MG tablet Take 1 tablet  (5 mg total) by mouth at bedtime. (Patient not taking: Reported on 07/02/2020) 30 tablet 0  . LORazepam (ATIVAN) 1 MG tablet Take 1 tablet (1 mg total) by mouth every 6 (six) hours as needed for anxiety. (Patient not taking: Reported on 07/02/2020) 60 tablet 0  . PAZEO 0.7 % SOLN PLACE 1 DROP IN [facility] EYE ONCE A DAY (Patient not taking: Reported on 07/02/2020)     No current facility-administered medications for this visit.    Medication Side Effects: .diarrhea resolved.  Allergies: No Known Allergies  Past Medical History:  Diagnosis Date  . Anxiety   . Depression   . Diabetes type 2, controlled (HCC)   . Elevated hemoglobin A1c   . Hyperlipidemia   . Migraine   . Multiple sclerosis (HCC)    receives Tysabri infusion q 28 days    Family History  Problem Relation Age of Onset  . Diabetes Mother   . Hypertension Mother   . Dementia Mother   . Bipolar disorder Mother   . Heart failure Father   . Hypertension Father   . Heart disease Father   . Ovarian cancer Maternal Grandmother   . Alzheimer's disease Paternal Grandmother   . Skin cancer Maternal Grandfather     Social History   Socioeconomic History  . Marital status: Married    Spouse name: Rollen Sox  . Number of children: 1  . Years of education: MA  . Highest education level: Not on file  Occupational History    Employer: [employer]  Tobacco Use  . Smoking status: Never Smoker  . Smokeless tobacco: Never Used  Vaping Use  . Vaping Use: Never used  Substance and Sexual Activity  . Alcohol use: Yes    Comment: socially; once a week  . Drug use: No  . Sexual activity: Yes    Birth control/protection: Pill  Other Topics Concern  . Not on file  Social History Narrative   Patient lives at home with family.   Caffeine Use: 1-2 cups daily   No tobacco, recreational drugs. Social drinking only.   Wears her seatbelt, has smoke alarm in the home.   Feels safe in her relationships.   Social Determinants of  Health   Financial Resource Strain: Not on file  Food Insecurity: Not on file  Transportation Needs: Not on file  Physical Activity: Not on file  Stress: Not on file  Social Connections: Not on file  Intimate Partner Violence: Not on file    Past Medical History, Surgical history, Social history, and Family history were reviewed and updated as appropriate.   Please see review of systems for further details on the patient's review from today.   Objective:   Physical Exam:  BP 115/76   Pulse 78   Physical Exam Constitutional:      General: She is not in acute distress. Musculoskeletal:        General: No deformity.  Neurological:     Mental Status: She is alert and oriented to person, place, and time.     Cranial Nerves: No dysarthria.     Coordination: Coordination normal.  Psychiatric:        Attention and Perception: Attention and perception normal. She does not perceive auditory or visual hallucinations.        Mood and Affect: Mood is not anxious or depressed. Affect is not labile, blunt, angry, tearful or inappropriate.        Speech: Speech is not rapid and pressured.        Behavior: Behavior is not agitated, aggressive or hyperactive. Behavior is cooperative.        Thought Content: Thought content normal. Thought content is not paranoid or delusional. Thought content does not include homicidal or suicidal ideation. Thought content does not include homicidal or suicidal plan.        Cognition and Memory: Cognition and memory normal.        Judgment: Judgment normal.     Comments: Insight intact Mania resolved but she remains highly irritable      Lab Review:     Component Value Date/Time   NA 135 08/22/2019 0752   NA 137 04/15/2014 1554   K 4.9 08/22/2019 0752   CL 102 08/22/2019 0752   CO2 25 08/22/2019 0752   GLUCOSE 156 (H) 08/22/2019 0752   BUN 14 08/22/2019 0752   BUN 15 04/15/2014 1554   CREATININE 0.85 08/22/2019 0752   CALCIUM 9.7 08/22/2019 0752    PROT 6.2 (L) 10/22/2016 1009   PROT 6.9 12/29/2015 1400   ALBUMIN 3.3 (L) 10/22/2016 1009   ALBUMIN 4.5 12/29/2015 1400   AST 26 10/22/2016 1009   ALT 25 10/22/2016 1009   ALKPHOS 81 10/22/2016 1009   BILITOT 1.0 10/22/2016 1009   BILITOT 0.6 12/29/2015 1400   GFRNONAA >60 12/25/2016 1254   GFRAA >60 12/25/2016 1254       Component Value Date/Time   WBC 8.1 12/25/2016 1254   RBC 4.34 12/25/2016 1254   HGB 12.0  12/25/2016 1254   HGB 11.9 06/30/2016 1122   HCT 35.8 (L) 12/25/2016 1254   HCT 36.2 06/30/2016 1122   PLT 255 12/25/2016 1254   PLT 237 06/30/2016 1122   MCV 82.5 12/25/2016 1254   MCV 81 06/30/2016 1122   MCH 27.6 12/25/2016 1254   MCHC 33.5 12/25/2016 1254   RDW 14.5 12/25/2016 1254   RDW 14.6 06/30/2016 1122   LYMPHSABS 1.6 10/22/2016 1009   LYMPHSABS 3.6 (H) 06/30/2016 1122   MONOABS 0.3 10/22/2016 1009   EOSABS 0.1 10/22/2016 1009   EOSABS 0.1 06/30/2016 1122   BASOSABS 0.0 10/22/2016 1009   BASOSABS 0.0 06/30/2016 1122    Lithium Lvl  Date Value Ref Range Status  08/22/2019 0.8 0.6 - 1.2 mmol/L Final   08/22/19 lithium after increase to 1200 mg daily.  No results found for: PHENYTOIN, PHENOBARB, VALPROATE, CBMZ   .res Assessment: Plan:     Joycelynn was seen today for follow-up, bipolar 1 disorder, manic, moderate (hcc) and manic behavior.  Diagnoses and all orders for this visit:  Bipolar 1 disorder, manic, moderate (HCC)  Panic disorder with agoraphobia  Diarrhea, unspecified type  Generalized anxiety disorder  Insomnia due to mental condition  Attention deficit hyperactivity disorder (ADHD), combined type  Lithium use  Other orders -     fluvoxaMINE (LUVOX) 50 MG tablet; Take 1 tablet (50 mg total) by mouth at bedtime.    Greater than 50% of 30-minute non face to face time with patient was spent on counseling and coordination of care. We discussed her chronically unstable bipolar disorder with a history of abrupt worsening of  depression with suicidal ideation.  Most recently she has been manic.  The addition of Haldol 5 mg did resolve the mania except for the remaining symptoms of rapid bursts of irritability and emotionality.  Of note, the irritability is not due to [name] because she switched to Ritalin and the irritability was unchanged.  She agrees to continue the lithium back to 1200 mg daily.  Need blood level of lithium ASAP.  This was emphasized. Diarrhea resolved with colestipol.  For mania, continue  [name] 120 mg which she's taken before.  Anita Black is not adequately managing the mania she has residual irritability and emotionality.  Under ideal circumstances we would increase the [name] to the maximum dose of 120 or 160 mg but it is unlikely that insurance will cover that dose even though it is FDA approved.  Fluoxetine will raise the blood level of [name] so we will add a low-dose of fluvoxamine to elevate the [name] blood level to better control the remaining symptoms.  The low dosage should not be sufficient to trigger any mania.  This was discussed in detail with her as well as the side effects.  This should work with then 1 to 2 weeks and she should call back if it fails to work.  Add Luvox 50 mg HS  Her insomnia is managed  Follow-up 1 month and call if sx don't continue to improve  Meredith Staggers MD, DFAPA   Please see After Visit Summary for patient specific instructions.  Future Appointments  Date Time Provider Department Center  08/21/2020 10:30 AM Cottle, Steva Ready., MD [facility] None    No orders of the defined types were placed in this encounter.     -------------------------------

## 2020-07-26 ENCOUNTER — Other Ambulatory Visit: Payer: Self-pay | Admitting: Psychiatry

## 2020-07-28 NOTE — Telephone Encounter (Signed)
I know this was a new start. Her f/u is 4/14

## 2020-08-14 LAB — LITHIUM LEVEL: Lithium Lvl: 1.2 mmol/L (ref 0.6–1.2)

## 2020-08-21 ENCOUNTER — Ambulatory Visit: Payer: Medicare Other | Admitting: Psychiatry

## 2020-08-29 ENCOUNTER — Other Ambulatory Visit: Payer: Self-pay | Admitting: Psychiatry

## 2020-09-17 ENCOUNTER — Other Ambulatory Visit: Payer: Self-pay

## 2020-09-17 ENCOUNTER — Ambulatory Visit (INDEPENDENT_AMBULATORY_CARE_PROVIDER_SITE_OTHER): Payer: Medicare Other | Admitting: Psychiatry

## 2020-09-17 ENCOUNTER — Encounter: Payer: Self-pay | Admitting: Psychiatry

## 2020-09-17 DIAGNOSIS — F3112 Bipolar disorder, current episode manic without psychotic features, moderate: Secondary | ICD-10-CM | POA: Diagnosis not present

## 2020-09-17 MED ORDER — LATUDA 120 MG PO TABS
120.0000 mg | ORAL_TABLET | Freq: Every day | ORAL | 0 refills | Status: DC
Start: 2020-09-17 — End: 2021-01-08

## 2020-09-17 MED ORDER — LITHIUM CARBONATE 300 MG PO CAPS: 1200.0000 mg | ORAL_CAPSULE | Freq: Every day | ORAL | 1 refills | Status: DC

## 2020-09-17 NOTE — Progress Notes (Signed)
Anita BaxterKelli M Black [id number] [birth year] 40 y.o.   Subjective:   Patient ID:  Anita BaxterKelli M Black is a 40 y.o. (DOB [birth year]) female.  Chief Complaint:  Chief Complaint  Patient presents with  . Follow-up  . Bipolar 1 disorder, manic, moderate (HCC)    Anxiety Symptoms include nervous/anxious behavior. Patient reports no confusion, decreased concentration, dizziness, nausea or suicidal ideas.    Depression        Associated symptoms include fatigue.  Associated symptoms include no decreased concentration and no suicidal ideas.  Past medical history includes anxiety.     Anita BaxterKelli M Black presents to the office today for follow-up of chronically unstable bipolar disorder and anxiety.  In March 26 , 2020.  She had been switched to [name] and had a good response.   When seen November 29, 2018 which she had reduced [name] on her own to 500 mg daily because of hair loss.  She was cautioned that this would probably not be enough medication to maintain mood stability but she did not want to make any medicine changes or increase the dose back up she was encouraged to take vitamins which often help with [name] related hair loss eventually.  visit December 12, 2018.  She had stopped [name] on her own due to hair loss and refused to consider taking vitamins to counteract this hair loss despite the fact that the [name] had been effective and she has had a very treatment resistant course with multiple med failures.  She asked about taking [name].  [name] was started at 5 mg a day for 1 week to increase then to 10 mg daily as a mood stabilizer.  She went up quicker.    It's been good.  Less irritable.  No SE.  Not manic.  Not much depression.  No change in chronic anxiety.  Sleep is the same and is OK.  Pretty level.  No worsenging mood sx with switch to [name] 10.  She's aware it is a low bipolar dose but satisfied.    seen January 10, 2019.  She had seen improvement with [name] 10 mg and so no  meds were changed. seen May 07, 2019.  She had stopped [name] 3-4 weeks later dT stomach pain which resolved off the meds.  Still taking Anita Black 120 daily, lamtrogine 200, lithium 900, Anita Black 60 daily.  We increased lamotrigine to 300 mg daily to try to help with ongoing mood instability.  07/04/19 appt with the followiing noted: No difference with increased lamotrigine to 300 mg.  Emotional outbursts easily triggered.  I got to do something about the outbursts.   Overall ok with a little moodiness.  Asks about increasing lamotrigine back to 200 BID.   Within last week hair loss stopped.   Anxiety still terrible but I still don't want to add anything else bc I'm on so much. Mostly worry vs physical sx. Ex convinced herself that F would get Covid and had to prepare for it.  No pattern to worry thoughts. No obsessions.  No panic attacks.  Sleep is okay.  Not profoundly depressed.  No hx drug abuse or alcohol abuse..    She reduced the lithium to 900 mg daily bc GI SE.  Somethings causing nausea again.  Higher dosage of lithium with less diarrhea manageable for right now.   Sleep OK. No manic SX.  Didn't realize how unstable she was until feeling stable. Plan: Reduce lamotrigine Back to 200 mg daily.   She agrees to  increase the lithium back to 1200 mg daily.  Wait 1 week then get lithium level BMP and TSH   07/10/19 TC with the following noted:  CO having to wean off Anita Black again DT cost. MD response:We have had to do this before and historically weaning her off Anita Black has led to more depression.  I would encourage her to investigate exactly when she meets deductible to ensure that that she wants to take this risk of worsening depression and mood swings. If she feels it necessary to wean off the Anita Black then cut the dosage in half to 60 mg for 2 weeks and then stop it.  If she has the opportunity to do 60 mg for 2 weeks and then 12:45 120 mg tablet for 2 additional weeks that would be ideal. If  her symptoms worsen we could consider loxapine now that it is available again but it is typically not as effective for depression as is Anita Black.  01/08/20 appt with the following noted: Stopped Anita Black after the samples.  BC high ded policy   Has been fine off the Anita Black.  Patient reports stable mood and denies depressed moods.  Brief periods of sadness over difficulties with MS.  MS is fairly stable. Still some irritability but usually manageable.  Goes away on her own if feels she'll explode which she does at least 4 times weekly.  Same as on Anita Black.  Patient with chronic manageable anxiety.  Patient denies difficulty with sleep initiation or maintenance. Denies appetite disturbance.  Patient reports that energy and motivation have been good.  Patient denies any difficulty with concentration.  Patient denies any suicidal ideation. Tolerating lithium without GI problems she had in the past. Plan: no med changes. She agrees to continue the lithium back to 1200 mg daily.  Labs good as noted lithium 0.8 on 1200.  04/02/2020 patient called for an urgent appointment today: She called on 03/20/2020 asking for something for anxiety.  Because she was on stimulant we gave her hydroxyzine 10 to 20 mg 3 times daily as needed. Says she doubted bipolar dx and now believes it. "I'm so manic".  Can't stop it.  I'm so mean and I can't stop it.  Tense inside and racing inside. Hydroxyzine didn't help. Recognized mania last week.  Felt high and euphoric.  Not crazy thoughts but impulsive and increased spending of [place]200 in [place] yesterday.  Is sleeping bc is exhausted.  Chronic MS fatigue.  Hyperactive.  More research online.  Hypomanic is her self dx.  Increased grandiosity of seeing things more clearly.  Impulsive and irritable alternating with euphoria. Can't stop herself. Been consistent with lithium.  Ran out of lamotrigine several weeks ago and forgot to pick it up.  Close friend noticed the changes and saying she  was talking fast. H doesn't believe in mental health problems. Plan: Disc dx mania which she had previously denied but now clearly recognizes.  She has a history of taking Anita Black with benefit but stopped it due to cost.  She believes her insurance will cover it now because she has a different policy.  We will start with 60 mg but increase to 120 mg very quickly because she is likely to need the higher dose to manage the mania. She agrees to continue the lithium back to 1200 mg daily.  Labs good as noted lithium 0.8 on 1200. For mania, start Anita Black 120 mg which she's taken before. Klonopin 1 mg TID until mania resolves  04/11/2020 appointment with  the following noted: Seen urgently to follow-up recent mania.  Seen as a work in appointment. I feel much better. Mania more controlled but still feels it.  Arm had been numb from anxiety and it's better but not gone.  Not racing as much.  Mind is clearer.  Still a little distractible but less.  Still feels a little too upbeat. Extreme diarrhea and lost 8# in the week.  Only change was Anita Black.  The first 4-5 nights was sedated but not last night. Doesn't like clonazepam bc very drowsy with it and only takes it when arm goes numb.  Taking only 1 every 2 days. She agrees to continue the lithium back to 1200 mg daily.  Labs good as noted lithium 0.8 on 1200. For mania, continue  Anita Black 120 mg which she's taken before.  It's helping. Start immodium prn  For diarrhea. Expect diarrhea will resolve as patient gets more used to the Anita Black.  She started right out at a high dose which is unusual. Klonopin 1 mg TID prn  until mania resolves.  Good that she does not use much bc of Anita Black.   04/16/20 TC's. Pt called and said that she is in the middle of a maniac state. She said that she is taking Anita Black and klonopin. However the klonopin is making her heart race and she is unable to rest. She has tried cutting it in half and still the same. She would like something  else called into her pharmacy so she can rest She recently switched back to Anita Black 120 mg which is so far not managing the mania.  She continues on lithium. The clonazepam is not making her heart race.  That is caused by the mania.  However I know she does not like the clonazepam so I will switch it to lorazepam 1 mg every 6 hours as needed agitation or insomnia. To better control the mania we will temporarily add haloperidol 5 mg nightly.  She is always concerned about weight gain and haloperidol does not have a significant risk of headache weight gain and is a good antimanic med.  Once the mania is controlled for a week or 2 then she can stop the haloperidol.  07/02/20 appt noted: Took Haldol 5 mg for a week and it calmed down and stopped it. It caused drowsiness. Manic sx resolved but still real irritable.  Insurance covering [place] so far.  Threw clonazepam away bc didn't seem to help.  Took lorazepam 1 mg and it seemed to have better calming effect.  Irritability in sudden bursts of 30 min and then resolves.  Cannot control it well.  Kind of weepy too.   EFA.  Sleep 7 hours.  Good function. On lithium 1200, Anita Black 120, Anita Black 60. Off lamotrigine since October.  GI rx colestipol and diarrhea stopped.  Took Ritalin 10 days without Anita Black and irritability without change. Tolerating medications  Plan: Add Luvox 50 mg HS for anxiety and irritability.  09/17/20 appt noted: Fluvoxamine for 3 weeks caused worsening runny nose and stopped.  Irritability got better and hasn't returned.  Mood has been good.  Anxiety is fine with nothing off the charts.  Sleep is good. SE only runny nose.  Diarrhea managed with colestipol.  past psych meds:  [name] 1500 good resp but hair loss,, lithium 1200, CBZ, Equetro helped but GI side effects, Trileptal,  lamotrigine, [name] retrial 10 GI, Rexulti, Anita Black 120,  Seroquel low dosages with weight gain, Haldol 5 drowsy Zoloft 150, Trintellix, ,  mirtazapine,  Lexapro, doxazosin.  Duloxetine side effects, long history of sertraline, Lexapro headaches, Trintellix,  trazodone, mirtazapine,   Clonazepam less effective than lorazepam for calming affect Adderall for MS fatigue, Anita Black for MS fatigue, armodafinil NR, Ritalin NR 20 BID without Anita Black  Review of Systems:  Review of Systems  Constitutional: Positive for fatigue and unexpected weight change.  Gastrointestinal: Negative for diarrhea, nausea and vomiting.  Skin:       Hair loss  Neurological: Negative for dizziness, tremors and weakness.       Balance problems  Psychiatric/Behavioral: Positive for depression. Negative for agitation, behavioral problems, confusion, decreased concentration, dysphoric mood, hallucinations, self-injury, sleep disturbance and suicidal ideas. The patient is nervous/anxious. The patient is not hyperactive.   Depression resolved  Medications: I have reviewed the patient's current medications.  Current Outpatient Medications  Medication Sig Dispense Refill  . atorvastatin (LIPITOR) 20 MG tablet Take 20 mg by mouth daily.    . Cholecalciferol (VITAMIN D3) 10000 units TABS Take by mouth.    . colestipol (COLESTID) 1 g tablet Take 2 g by mouth daily.    [name] lisdexamfetamine (Anita Black) 60 MG capsule Take 1 capsule (60 mg total) by mouth every morning. 30 capsule 0  . lithium carbonate 300 MG capsule TAKE 4 CAPSULES (1,200 MG TOTAL) BY MOUTH AT BEDTIME. 360 capsule 1  . Lurasidone HCl (Anita Black) 120 MG TABS Take 1 tablet (120 mg total) by mouth daily. 90 tablet 0  . metFORMIN (GLUCOPHAGE) 500 MG tablet 1000 mg BID with meals. Needs appt prior to anymore refills. 60 tablet 0  . ocrelizumab (OCREVUS) 300 MG/10ML injection Inject into the vein.    . rizatriptan (MAXALT-MLT) 10 MG disintegrating tablet Take 1 tablet (10 mg total) by mouth as needed for migraine. May repeat in 2 hours if needed 9 tablet 11  . SPRINTEC 28 0.25-35 MG-MCG tablet     . valACYclovir (VALTREX) 1000  MG tablet 2000 mg every 12 hours for 2 doses. 8 tablet 3  . BD SAFETYGLIDE SHIELDED NEEDLE 25G X 1" MISC USE AS DIRECTED 10 each 7  . fluvoxaMINE (LUVOX) 50 MG tablet TAKE 1 TABLET BY MOUTH EVERYDAY AT BEDTIME (Patient not taking: Reported on 09/17/2020) 30 tablet 1  . haloperidol (HALDOL) 5 MG tablet Take 1 tablet (5 mg total) by mouth at bedtime. (Patient not taking: No sig reported) 30 tablet 0  . LORazepam (ATIVAN) 1 MG tablet Take 1 tablet (1 mg total) by mouth every 6 (six) hours as needed for anxiety. (Patient not taking: No sig reported) 60 tablet 0  . PAZEO 0.7 % SOLN PLACE 1 DROP IN [facility] EYE ONCE A DAY (Patient not taking: No sig reported)     No current facility-administered medications for this visit.    Medication Side Effects: .diarrhea resolved.  Anita Black runny nose worse with fluvoxamine   Allergies: No Known Allergies  Past Medical History:  Diagnosis Date  . Anxiety   . Depression   . Diabetes type 2, controlled (HCC)   . Elevated hemoglobin A1c   . Hyperlipidemia   . Migraine   . Multiple sclerosis (HCC)    receives Tysabri infusion q 28 days    Family History  Problem Relation Age of Onset  . Diabetes Mother   . Hypertension Mother   . Dementia Mother   . Bipolar disorder Mother   . Heart failure Father   . Hypertension Father   . Heart disease Father   . Ovarian cancer Maternal  Grandmother   . Alzheimer's disease Paternal Grandmother   . Skin cancer Maternal Grandfather     Social History   Socioeconomic History  . Marital status: Married    Spouse name: Rollen Sox  . Number of children: 1  . Years of education: MA  . Highest education level: Not on file  Occupational History    Employer: [employer]  Tobacco Use  . Smoking status: Never Smoker  . Smokeless tobacco: Never Used  Vaping Use  . Vaping Use: Never used  Substance and Sexual Activity  . Alcohol use: Yes    Comment: socially; once a week  . Drug use: No  . Sexual activity: Yes     Birth control/protection: Pill  Other Topics Concern  . Not on file  Social History Narrative   Patient lives at home with family.   Caffeine Use: 1-2 cups daily   No tobacco, recreational drugs. Social drinking only.   Wears her seatbelt, has smoke alarm in the home.   Feels safe in her relationships.   Social Determinants of Health   Financial Resource Strain: Not on file  Food Insecurity: Not on file  Transportation Needs: Not on file  Physical Activity: Not on file  Stress: Not on file  Social Connections: Not on file  Intimate Partner Violence: Not on file    Past Medical History, Surgical history, Social history, and Family history were reviewed and updated as appropriate.   Please see review of systems for further details on the patient's review from today.   Objective:   Physical Exam:  BP 112/73   Pulse 82   Physical Exam Constitutional:      General: She is not in acute distress. Musculoskeletal:        General: No deformity.  Neurological:     Mental Status: She is alert and oriented to person, place, and time.     Cranial Nerves: No dysarthria.     Coordination: Coordination normal.  Psychiatric:        Attention and Perception: Attention and perception normal. She does not perceive auditory or visual hallucinations.        Mood and Affect: Mood is not anxious or depressed. Affect is not labile, blunt, angry, tearful or inappropriate.        Speech: Speech is not rapid and pressured.        Behavior: Behavior is not agitated, aggressive or hyperactive. Behavior is cooperative.        Thought Content: Thought content normal. Thought content is not paranoid or delusional. Thought content does not include homicidal or suicidal ideation. Thought content does not include homicidal or suicidal plan.        Cognition and Memory: Cognition and memory normal.        Judgment: Judgment normal.     Comments: Insight intact Mania resolved as did irritability       Lab Review:     Component Value Date/Time   NA 135 08/22/2019 0752   NA 137 04/15/2014 1554   K 4.9 08/22/2019 0752   CL 102 08/22/2019 0752   CO2 25 08/22/2019 0752   GLUCOSE 156 (H) 08/22/2019 0752   BUN 14 08/22/2019 0752   BUN 15 04/15/2014 1554   CREATININE 0.85 08/22/2019 0752   CALCIUM 9.7 08/22/2019 0752   PROT 6.2 (L) 10/22/2016 1009   PROT 6.9 12/29/2015 1400   ALBUMIN 3.3 (L) 10/22/2016 1009   ALBUMIN 4.5 12/29/2015 1400   AST 26 10/22/2016  1009   ALT 25 10/22/2016 1009   ALKPHOS 81 10/22/2016 1009   BILITOT 1.0 10/22/2016 1009   BILITOT 0.6 12/29/2015 1400   GFRNONAA >60 12/25/2016 1254   GFRAA >60 12/25/2016 1254       Component Value Date/Time   WBC 8.1 12/25/2016 1254   RBC 4.34 12/25/2016 1254   HGB 12.0 12/25/2016 1254   HGB 11.9 06/30/2016 1122   HCT 35.8 (L) 12/25/2016 1254   HCT 36.2 06/30/2016 1122   PLT 255 12/25/2016 1254   PLT 237 06/30/2016 1122   MCV 82.5 12/25/2016 1254   MCV 81 06/30/2016 1122   MCH 27.6 12/25/2016 1254   MCHC 33.5 12/25/2016 1254   RDW 14.5 12/25/2016 1254   RDW 14.6 06/30/2016 1122   LYMPHSABS 1.6 10/22/2016 1009   LYMPHSABS 3.6 (H) 06/30/2016 1122   MONOABS 0.3 10/22/2016 1009   EOSABS 0.1 10/22/2016 1009   EOSABS 0.1 06/30/2016 1122   BASOSABS 0.0 10/22/2016 1009   BASOSABS 0.0 06/30/2016 1122    Lithium Lvl  Date Value Ref Range Status  08/13/2020 1.2 0.6 - 1.2 mmol/L Final  08/13/20 lithium 1200= level 1.2 No results found for: PHENYTOIN, PHENOBARB, VALPROATE, CBMZ   .res Assessment: Plan:     There are no diagnoses linked to this encounter.  Greater than 50% of 30-minute non face to face time with patient was spent on counseling and coordination of care. We discussed her chronically unstable bipolar disorder with a history of abrupt worsening of depression with suicidal ideation.  Most recently she has been manic.  The addition of Haldol 5 mg did resolve the mania but caused SE.   Overall mood  sx stable at present.   Disc DDI with fluvox and Anita Black which solved irritability but increased blood level of Anita Black and worsened SE of rhinorrhea due to the [facility]  Of note, the hx irritability is not due to Anita Black because she switched to Ritalin and the irritability was unchanged. Anita Black being taken from neuro for MS fatigue.  MS stable.  She agrees to continue the lithium back to 1200 mg daily.  08/13/20 lithium 1200= level 1.2 Diarrhea resolved with colestipol. Counseled patient regarding potential benefits, risks, and side effects of lithium to include potential risk of lithium affecting thyroid and renal function.  Discussed need for periodic lab monitoring to determine drug level and to assess for potential adverse effects.  Counseled patient regarding signs and symptoms of lithium toxicity and advised that they notify office immediately or seek urgent medical attention if experiencing these signs and symptoms.  Patient advised to contact office with any questions or concerns.  For mania, continue  Anita Black 120 mg which she's taken before.  Anita Black is now adequately managing the mania. Her insomnia is managed  Follow-up 3-4 mos  Meredith Staggers MD, DFAPA   Please see After Visit Summary for patient specific instructions.  No future appointments.  No orders of the defined types were placed in this encounter.     -------------------------------

## 2020-10-14 ENCOUNTER — Other Ambulatory Visit: Payer: Self-pay | Admitting: Psychiatry

## 2020-10-14 DIAGNOSIS — F3112 Bipolar disorder, current episode manic without psychotic features, moderate: Secondary | ICD-10-CM

## 2020-10-28 ENCOUNTER — Other Ambulatory Visit: Payer: Self-pay | Admitting: Psychiatry

## 2021-01-08 ENCOUNTER — Other Ambulatory Visit: Payer: Self-pay | Admitting: Psychiatry

## 2021-01-08 DIAGNOSIS — F3112 Bipolar disorder, current episode manic without psychotic features, moderate: Secondary | ICD-10-CM

## 2021-01-19 ENCOUNTER — Encounter: Payer: Self-pay | Admitting: Psychiatry

## 2021-01-19 ENCOUNTER — Other Ambulatory Visit: Payer: Self-pay

## 2021-01-19 ENCOUNTER — Ambulatory Visit (INDEPENDENT_AMBULATORY_CARE_PROVIDER_SITE_OTHER): Payer: 59 | Admitting: Psychiatry

## 2021-01-19 VITALS — BP 118/82 | HR 87

## 2021-01-19 DIAGNOSIS — F3112 Bipolar disorder, current episode manic without psychotic features, moderate: Secondary | ICD-10-CM

## 2021-01-19 DIAGNOSIS — F5105 Insomnia due to other mental disorder: Secondary | ICD-10-CM

## 2021-01-19 DIAGNOSIS — F902 Attention-deficit hyperactivity disorder, combined type: Secondary | ICD-10-CM

## 2021-01-19 DIAGNOSIS — F411 Generalized anxiety disorder: Secondary | ICD-10-CM

## 2021-01-19 DIAGNOSIS — F4001 Agoraphobia with panic disorder: Secondary | ICD-10-CM | POA: Diagnosis not present

## 2021-01-19 DIAGNOSIS — Z79899 Other long term (current) drug therapy: Secondary | ICD-10-CM

## 2021-01-19 MED ORDER — ALPRAZOLAM 0.5 MG PO TABS: ORAL_TABLET | ORAL | 1 refills | Status: DC

## 2021-01-19 MED ORDER — LITHIUM CARBONATE 300 MG PO CAPS: 1200.0000 mg | ORAL_CAPSULE | Freq: Every day | ORAL | 1 refills | Status: DC

## 2021-01-19 NOTE — Progress Notes (Signed)
Anita Black [id number] [birth year] 40 y.o.   Subjective:   Patient ID:  Anita Black is a 40 y.o. (DOB 1980/07/30) female.  Chief Complaint:  Chief Complaint  Patient presents with   Follow-up   Bipolar 1 disorder    Anxiety Symptoms include nervous/anxious behavior. Patient reports no confusion, decreased concentration, dizziness, nausea or suicidal ideas.    Depression        Associated symptoms include fatigue.  Associated symptoms include no decreased concentration and no suicidal ideas.  Past medical history includes anxiety.    Anita Black presents to the office today for follow-up of chronically unstable bipolar disorder and anxiety.  In March 26 , 2020.  She had been switched to [name] and had a good response.   When seen November 29, 2018 which she had reduced [name] on her own to 500 mg daily because of hair loss.  She was cautioned that this would probably not be enough medication to maintain mood stability but she did not want to make any medicine changes or increase the dose back up she was encouraged to take vitamins which often help with [name] related hair loss eventually.  visit December 12, 2018.  She had stopped [name] on her own due to hair loss and refused to consider taking vitamins to counteract this hair loss despite the fact that the [name] had been effective and she has had a very treatment resistant course with multiple med failures.  She asked about taking [name].  [name] was started at 5 mg a day for 1 week to increase then to 10 mg daily as a mood stabilizer.  She went up quicker.    It's been good.  Less irritable.  No SE.  Not manic.  Not much depression.  No change in chronic anxiety.  Sleep is the same and is OK.  Pretty level.  No worsenging mood sx with switch to [name] 10.  She's aware it is a low bipolar dose but satisfied.    seen January 10, 2019.  She had seen improvement with [name] 10 mg and so no meds were changed. seen  May 07, 2019.  She had stopped [name] 3-4 weeks later dT stomach pain which resolved off the meds.  Still taking [name] 120 daily, lamtrogine 200, lithium 900, [name] 60 daily.  We increased lamotrigine to 300 mg daily to try to help with ongoing mood instability.  07/04/19 appt with the followiing noted: No difference with increased lamotrigine to 300 mg.  Emotional outbursts easily triggered.  I got to do something about the outbursts.   Overall ok with a little moodiness.  Asks about increasing lamotrigine back to 200 BID.   Within last week hair loss stopped.   Anxiety still terrible but I still don't want to add anything else bc I'm on so much. Mostly worry vs physical sx. Ex convinced herself that F would get Covid and had to prepare for it.  No pattern to worry thoughts. No obsessions.  No panic attacks.  Sleep is okay.  Not profoundly depressed.  No hx drug abuse or alcohol abuse..    She reduced the lithium to 900 mg daily bc GI SE.  Somethings causing nausea again.  Higher dosage of lithium with less diarrhea manageable for right now.   Sleep OK. No manic SX.  Didn't realize how unstable she was until feeling stable. Plan: Reduce lamotrigine Back to 200 mg daily.   She agrees to increase the lithium back  to 1200 mg daily.  Wait 1 week then get lithium level BMP and TSH   07/10/19 TC with the following noted:  CO having to wean off [name] again DT cost. MD response:We have had to do this before and historically weaning her off [name] has led to more depression.  I would encourage her to investigate exactly when she meets deductible to ensure that that she wants to take this risk of worsening depression and mood swings. If she feels it necessary to wean off the [name] then cut the dosage in half to 60 mg for 2 weeks and then stop it.  If she has the opportunity to do 60 mg for 2 weeks and then 12:45 120 mg tablet for 2 additional weeks that would be ideal. If her symptoms worsen we  could consider loxapine now that it is available again but it is typically not as effective for depression as is [name].  01/08/20 appt with the following noted: Stopped [name] after the samples.  BC high ded policy   Has been fine off the [name].  Patient reports stable mood and denies depressed moods.  Brief periods of sadness over difficulties with MS.  MS is fairly stable. Still some irritability but usually manageable.  Goes away on her own if feels she'll explode which she does at least 4 times weekly.  Same as on [name].  Patient with chronic manageable anxiety.  Patient denies difficulty with sleep initiation or maintenance. Denies appetite disturbance.  Patient reports that energy and motivation have been good.  Patient denies any difficulty with concentration.  Patient denies any suicidal ideation. Tolerating lithium without GI problems she had in the past. Plan: no med changes. She agrees to continue the lithium back to 1200 mg daily.  Labs good as noted lithium 0.8 on 1200.  04/02/2020 patient called for an urgent appointment today: She called on 03/20/2020 asking for something for anxiety.  Because she was on stimulant we gave her hydroxyzine 10 to 20 mg 3 times daily as needed. Says she doubted bipolar dx and now believes it. "I'm so manic".  Can't stop it.  I'm so mean and I can't stop it.  Tense inside and racing inside. Hydroxyzine didn't help. Recognized mania last week.  Felt high and euphoric.  Not crazy thoughts but impulsive and increased spending of [place]200 in arc[place] yesterday.  Is sleeping bc is exhausted.  Chronic MS fatigue.  Hyperactive.  More research online.  Hypomanic is her self dx.  Increased grandiosity of seeing things more clearly.  Impulsive and irritable alternating with euphoria. Can't stop herself. Been consistent with lithium.  Ran out of lamotrigine several weeks ago and forgot to pick it up.  Close friend noticed the changes and saying she was talking fast. H  doesn't believe in mental health problems. Plan: Disc dx mania which she had previously denied but now clearly recognizes.  She has a history of taking [name] with benefit but stopped it due to cost.  She believes her insurance will cover it now because she has a different policy.  We will start with 60 mg but increase to 120 mg very quickly because she is likely to need the higher dose to manage the mania. She agrees to continue the lithium back to 1200 mg daily.  Labs good as noted lithium 0.8 on 1200. For mania, start [name] 120 mg which she's taken before. Klonopin 1 mg TID until mania resolves  04/11/2020 appointment with the following noted: Seen  urgently to follow-up recent mania.  Seen as a work in appointment. I feel much better. Mania more controlled but still feels it.  Arm had been numb from anxiety and it's better but not gone.  Not racing as much.  Mind is clearer.  Still a little distractible but less.  Still feels a little too upbeat. Extreme diarrhea and lost 8# in the week.  Only change was [name].  The first 4-5 nights was sedated but not last night. Doesn't like clonazepam bc very drowsy with it and only takes it when arm goes numb.  Taking only 1 every 2 days. She agrees to continue the lithium back to 1200 mg daily.  Labs good as noted lithium 0.8 on 1200. For mania, continue  [name] 120 mg which she's taken before.  It's helping. Start immodium prn  For diarrhea. Expect diarrhea will resolve as patient gets more used to the [name].  She started right out at a high dose which is unusual. Klonopin 1 mg TID prn  until mania resolves.  Good that she does not use much bc of [name].   04/16/20 TC's. Pt called and said that she is in the middle of a maniac state. She said that she is taking latuda and klonopin. However the klonopin is making her heart race and she is unable to rest. She has tried cutting it in half and still the same. She would like something else called into her  pharmacy so she can rest She recently switched back to [name] 120 mg which is so far not managing the mania.  She continues on lithium. The clonazepam is not making her heart race.  That is caused by the mania.  However I know she does not like the clonazepam so I will switch it to lorazepam 1 mg every 6 hours as needed agitation or insomnia. To better control the mania we will temporarily add haloperidol 5 mg nightly.  She is always concerned about weight gain and haloperidol does not have a significant risk of headache weight gain and is a good antimanic med.  Once the mania is controlled for a week or 2 then she can stop the haloperidol.   07/02/20 appt noted: Took Haldol 5 mg for a week and it calmed down and stopped it. It caused drowsiness. Manic sx resolved but still real irritable.  Insurance covering [place] so far.  Threw clonazepam away bc didn't seem to help.  Took lorazepam 1 mg and it seemed to have better calming effect.  Irritability in sudden bursts of 30 min and then resolves.  Cannot control it well.  Kind of weepy too.   EFA.  Sleep 7 hours.  Good function. On lithium 1200, [name] 120, [name] 60. Off lamotrigine since October.  GI rx colestipol and diarrhea stopped.  Took Ritalin 10 days without [name] and irritability without change. Tolerating medications  Plan: Add Luvox 50 mg HS for anxiety and irritability.  09/17/20 appt noted: Fluvoxamine for 3 weeks caused worsening runny nose and stopped.  Irritability got better and hasn't returned.  Mood has been good.  Anxiety is fine with nothing off the charts.  Sleep is good. SE only runny nose.  Diarrhea managed with colestipol. Plan: She agrees to continue the lithium back to 1200 mg daily.  08/13/20 lithium 1200= level 1.2 Diarrhea resolved with colestipol. Continue [name] 120  01/19/2021 appointment with the following noted: Doing good.  Pretty stable since here.   CO weight gain eating at night  to take it.  Too sleepy  if takes with dinner. Not had Covid. CO lorazepam 1 mg prn anxiety not being effective.  Using it 3 times weekly.  past psych meds:  [name] 1500 good resp but hair loss,, lithium 1200, CBZ, Equetro helped but GI side effects, Trileptal,  lamotrigine, [name] retrial 10 GI, Rexulti, [name] 120,  Seroquel low dosages with weight gain, Haldol 5 drowsy Zoloft 150, Trintellix, , mirtazapine, Lexapro, doxazosin.  Duloxetine side effects, long history of sertraline, Lexapro headaches, Trintellix,  trazodone, mirtazapine,   Clonazepam less effective than lorazepam for calming affect Remote high dose Xanax 2 mg. Adderall for MS fatigue, [name] for MS fatigue, armodafinil NR, Ritalin NR 20 BID without [name]  Review of Systems:  Review of Systems  Constitutional:  Positive for fatigue and unexpected weight change.  Gastrointestinal:  Negative for diarrhea, nausea and vomiting.  Skin:        Hair loss  Neurological:  Negative for dizziness, tremors and weakness.       Balance problems  Psychiatric/Behavioral:  Negative for agitation, behavioral problems, confusion, decreased concentration, dysphoric mood, hallucinations, self-injury, sleep disturbance and suicidal ideas. The patient is nervous/anxious. The patient is not hyperactive.  Depression resolved  Medications: I have reviewed the patient's current medications.  Current Outpatient Medications  Medication Sig Dispense Refill   ALPRAZolam (XANAX) 0.5 MG tablet 1-2 tablets up to 3 times weekly for anxiety/panic 24 tablet 1   Cholecalciferol (VITAMIN D3) 10000 units TABS Take by mouth.     colestipol (COLESTID) 1 g tablet Take 2 g by mouth daily.     [name] 120 MG TABS TAKE 1 TABLET BY MOUTH EVERY DAY 90 tablet 0   lisdexamfetamine ([name]) 60 MG capsule Take 1 capsule (60 mg total) by mouth every morning. 30 capsule 0   metFORMIN (GLUCOPHAGE) 500 MG tablet 1000 mg BID with meals. Needs appt prior to anymore refills. 60 tablet 0    ocrelizumab (OCREVUS) 300 MG/10ML injection Inject into the vein.     rizatriptan (MAXALT-MLT) 10 MG disintegrating tablet Take 1 tablet (10 mg total) by mouth as needed for migraine. May repeat in 2 hours if needed 9 tablet 11   SPRINTEC 28 0.25-35 MG-MCG tablet      valACYclovir (VALTREX) 1000 MG tablet 2000 mg every 12 hours for 2 doses. 8 tablet 3   atorvastatin (LIPITOR) 20 MG tablet Take 20 mg by mouth daily. (Patient not taking: Reported on 01/19/2021)     lithium carbonate 300 MG capsule Take 4 capsules (1,200 mg total) by mouth at bedtime. 360 capsule 1   PAZEO 0.7 % SOLN PLACE 1 DROP IN [facility] EYE ONCE A DAY (Patient not taking: No sig reported)     No current facility-administered medications for this visit.    Medication Side Effects: .diarrhea resolved.  [name] runny nose worse with fluvoxamine   Allergies: No Known Allergies  Past Medical History:  Diagnosis Date   Anxiety    Depression    Diabetes type 2, controlled (HCC)    Elevated hemoglobin A1c    Hyperlipidemia    Migraine    Multiple sclerosis (HCC)    receives Tysabri infusion q 28 days    Family History  Problem Relation Age of Onset   Diabetes Mother    Hypertension Mother    Dementia Mother    Bipolar disorder Mother    Heart failure Father    Hypertension Father    Heart disease Father  Ovarian cancer Maternal Grandmother    Alzheimer's disease Paternal Grandmother    Skin cancer Maternal Grandfather     Social History   Socioeconomic History   Marital status: Married    Spouse name: Anita Black   Number of children: 1   Years of education: MA   Highest education level: Not on file  Occupational History    Employer: [employer]  Tobacco Use   Smoking status: Never   Smokeless tobacco: Never  Vaping Use   Vaping Use: Never used  Substance and Sexual Activity   Alcohol use: Yes    Comment: socially; once a week   Drug use: No   Sexual activity: Yes    Birth control/protection: Pill   Other Topics Concern   Not on file  Social History Narrative   Patient lives at home with family.   Caffeine Use: 1-2 cups daily   No tobacco, recreational drugs. Social drinking only.   Wears her seatbelt, has smoke alarm in the home.   Feels safe in her relationships.   Social Determinants of Health   Financial Resource Strain: Not on file  Food Insecurity: Not on file  Transportation Needs: Not on file  Physical Activity: Not on file  Stress: Not on file  Social Connections: Not on file  Intimate Partner Violence: Not on file    Past Medical History, Surgical history, Social history, and Family history were reviewed and updated as appropriate.   Please see review of systems for further details on the patient's review from today.   Objective:   Physical Exam:  BP 118/82   Pulse 87   Physical Exam Constitutional:      General: She is not in acute distress. Musculoskeletal:        General: No deformity.  Neurological:     Mental Status: She is alert and oriented to person, place, and time.     Cranial Nerves: No dysarthria.     Coordination: Coordination normal.  Psychiatric:        Attention and Perception: Attention and perception normal. She does not perceive auditory or visual hallucinations.        Mood and Affect: Mood is anxious. Mood is not depressed. Affect is not labile, blunt, angry, tearful or inappropriate.        Speech: Speech is not rapid and pressured.        Behavior: Behavior is not agitated, aggressive or hyperactive. Behavior is cooperative.        Thought Content: Thought content normal. Thought content is not paranoid or delusional. Thought content does not include homicidal or suicidal ideation. Thought content does not include homicidal or suicidal plan.        Cognition and Memory: Cognition and memory normal.        Judgment: Judgment normal.     Comments: Insight intact Mania resolved as did irritability     Lab Review:     Component  Value Date/Time   NA 135 08/22/2019 0752   NA 137 04/15/2014 1554   K 4.9 08/22/2019 0752   CL 102 08/22/2019 0752   CO2 25 08/22/2019 0752   GLUCOSE 156 (H) 08/22/2019 0752   BUN 14 08/22/2019 0752   BUN 15 04/15/2014 1554   CREATININE 0.85 08/22/2019 0752   CALCIUM 9.7 08/22/2019 0752   PROT 6.2 (L) 10/22/2016 1009   PROT 6.9 12/29/2015 1400   ALBUMIN 3.3 (L) 10/22/2016 1009   ALBUMIN 4.5 12/29/2015 1400   AST 26  10/22/2016 1009   ALT 25 10/22/2016 1009   ALKPHOS 81 10/22/2016 1009   BILITOT 1.0 10/22/2016 1009   BILITOT 0.6 12/29/2015 1400   GFRNONAA >60 12/25/2016 1254   GFRAA >60 12/25/2016 1254       Component Value Date/Time   WBC 8.1 12/25/2016 1254   RBC 4.34 12/25/2016 1254   HGB 12.0 12/25/2016 1254   HGB 11.9 06/30/2016 1122   HCT 35.8 (L) 12/25/2016 1254   HCT 36.2 06/30/2016 1122   PLT 255 12/25/2016 1254   PLT 237 06/30/2016 1122   MCV 82.5 12/25/2016 1254   MCV 81 06/30/2016 1122   MCH 27.6 12/25/2016 1254   MCHC 33.5 12/25/2016 1254   RDW 14.5 12/25/2016 1254   RDW 14.6 06/30/2016 1122   LYMPHSABS 1.6 10/22/2016 1009   LYMPHSABS 3.6 (H) 06/30/2016 1122   MONOABS 0.3 10/22/2016 1009   EOSABS 0.1 10/22/2016 1009   EOSABS 0.1 06/30/2016 1122   BASOSABS 0.0 10/22/2016 1009   BASOSABS 0.0 06/30/2016 1122    Lithium Lvl  Date Value Ref Range Status  08/13/2020 1.2 0.6 - 1.2 mmol/L Final  08/13/20 lithium 1200= level 1.2 No results found for: PHENYTOIN, PHENOBARB, VALPROATE, CBMZ   .res Assessment: Plan:     Shaylon was seen today for follow-up and bipolar 1 disorder.  Diagnoses and all orders for this visit:  Bipolar 1 disorder, manic, moderate (HCC) -     lithium carbonate 300 MG capsule; Take 4 capsules (1,200 mg total) by mouth at bedtime. -     Lithium level -     TSH  Panic disorder with agoraphobia -     ALPRAZolam (XANAX) 0.5 MG tablet; 1-2 tablets up to 3 times weekly for anxiety/panic  Generalized anxiety disorder  Insomnia due  to mental condition  Attention deficit hyperactivity disorder (ADHD), combined type  Lithium use -     Lithium level -     TSH   Greater than 50% of 30-minute non face to face time with patient was spent on counseling and coordination of care. We discussed her chronically unstable bipolar disorder with a history of abrupt worsening of depression with suicidal ideation.  Most recently she has been manic.     Overall mood sx stable at present.   Disc DDI with fluvox and [name] which solved irritability but increased blood level of [name] and worsened SE of rhinorrhea due to the [facility]  Of note, the hx irritability is not due to [name] because she switched to Ritalin and the irritability was unchanged. [name] being taken from neuro for MS fatigue.  MS stable.  She agrees to continue the lithium back to 1200 mg daily.  08/13/20 lithium 1200= level 1.2 12/2020 Cr normal at [facility] 0.78 Diarrhea resolved with colestipol. Counseled patient regarding potential benefits, risks, and side effects of lithium to include potential risk of lithium affecting thyroid and renal function.  Discussed need for periodic lab monitoring to determine drug level and to assess for potential adverse effects.  Counseled patient regarding signs and symptoms of lithium toxicity and advised that they notify office immediately or seek urgent medical attention if experiencing these signs and symptoms.  Patient advised to contact office with any questions or concerns. Check lithium and TSH  For mania, continue  [name] 120 mg which she's taken before.  [name] is now adequately managing the mania.   Try splitting dose between 2 meals bc sleepiness from it.  OK change BZ to infrequent use of  Xanax 0.5-1 mg prn anxiety about 3 times per week. DC lorazepam BC ineffective.  Her insomnia is managed  Follow-up 4 mos  Meredith Staggersarey Cottle MD, DFAPA   Please see After Visit Summary for patient specific instructions.  No future  appointments.   Orders Placed This Encounter  Procedures   Lithium level   TSH       -------------------------------

## 2021-04-07 LAB — LITHIUM LEVEL: Lithium Lvl: 1.4 mmol/L — ABNORMAL HIGH (ref 0.6–1.2)

## 2021-04-07 LAB — TSH: TSH: 5.48 mIU/L — ABNORMAL HIGH

## 2021-04-14 ENCOUNTER — Other Ambulatory Visit: Payer: Self-pay | Admitting: Psychiatry

## 2021-04-14 DIAGNOSIS — F3112 Bipolar disorder, current episode manic without psychotic features, moderate: Secondary | ICD-10-CM

## 2021-04-14 NOTE — Telephone Encounter (Signed)
90 day ok?

## 2021-04-16 NOTE — Progress Notes (Signed)
Lithium level slightly elevated at 1.4.  Previous level 1.2.  TSH slightly elevated at 5.48 with previous level 5.1.  We will evaluate clinically at her appointment in January

## 2021-05-04 ENCOUNTER — Other Ambulatory Visit: Payer: Self-pay | Admitting: Psychiatry

## 2021-05-04 DIAGNOSIS — F4001 Agoraphobia with panic disorder: Secondary | ICD-10-CM

## 2021-05-06 NOTE — Telephone Encounter (Signed)
Last filled 10/24 appt on 1/10

## 2021-05-19 ENCOUNTER — Encounter: Payer: Self-pay | Admitting: Psychiatry

## 2021-05-19 ENCOUNTER — Ambulatory Visit (INDEPENDENT_AMBULATORY_CARE_PROVIDER_SITE_OTHER): Payer: Medicare Other | Admitting: Psychiatry

## 2021-05-19 ENCOUNTER — Other Ambulatory Visit: Payer: Self-pay

## 2021-05-19 VITALS — BP 98/71 | HR 77

## 2021-05-19 DIAGNOSIS — F411 Generalized anxiety disorder: Secondary | ICD-10-CM | POA: Diagnosis not present

## 2021-05-19 DIAGNOSIS — F5105 Insomnia due to other mental disorder: Secondary | ICD-10-CM | POA: Diagnosis not present

## 2021-05-19 DIAGNOSIS — F3112 Bipolar disorder, current episode manic without psychotic features, moderate: Secondary | ICD-10-CM | POA: Diagnosis not present

## 2021-05-19 DIAGNOSIS — F902 Attention-deficit hyperactivity disorder, combined type: Secondary | ICD-10-CM

## 2021-05-19 DIAGNOSIS — Z79899 Other long term (current) drug therapy: Secondary | ICD-10-CM

## 2021-05-19 DIAGNOSIS — F4001 Agoraphobia with panic disorder: Secondary | ICD-10-CM | POA: Diagnosis not present

## 2021-05-19 NOTE — Patient Instructions (Signed)
Trial reduction [name] to 60 mg daily. If starts having mood sx start Vrayalar 1.5 mg and increase to 3 mg daily.

## 2021-05-19 NOTE — Progress Notes (Signed)
[name] [id number] [birth year] 41 y.o.   Subjective:   Patient ID:  Anita Black is a 41 y.o. (DOB 11/01/1980) female.  Chief Complaint:  Chief Complaint  Patient presents with   Follow-up    Bipolar 1 disorder, manic, moderate (HCC)    Anxiety Symptoms include nervous/anxious behavior. Patient reports no confusion, decreased concentration, dizziness, nausea or suicidal ideas.    Depression        Associated symptoms include no decreased concentration, no fatigue and no suicidal ideas.  Past medical history includes anxiety.    Anita Black presents to the office today for follow-up of chronically unstable bipolar disorder and anxiety.  In March 26 , 2020.  She had been switched to [name] and had a good response.   When seen November 29, 2018 which she had reduced [name] on her own to 500 mg daily because of hair loss.  She was cautioned that this would probably not be enough medication to maintain mood stability but she did not want to make any medicine changes or increase the dose back up she was encouraged to take vitamins which often help with [name] related hair loss eventually.  visit December 12, 2018.  She had stopped [name] on her own due to hair loss and refused to consider taking vitamins to counteract this hair loss despite the fact that the [name] had been effective and she has had a very treatment resistant course with multiple med failures.  She asked about taking [name].  [name] was started at 5 mg a day for 1 week to increase then to 10 mg daily as a mood stabilizer.  She went up quicker.    It's been good.  Less irritable.  No SE.  Not manic.  Not much depression.  No change in chronic anxiety.  Sleep is the same and is OK.  Pretty level.  No worsenging mood sx with switch to [name] 10.  She's aware it is a low bipolar dose but satisfied.    seen January 10, 2019.  She had seen improvement with [name] 10 mg and so no meds were changed. seen  May 07, 2019.  She had stopped [name] 3-4 weeks later dT stomach pain which resolved off the meds.  Still taking [name] 120 daily, lamtrogine 200, lithium 900, [name] 60 daily.  We increased lamotrigine to 300 mg daily to try to help with ongoing mood instability.  07/04/19 appt with the followiing noted: No difference with increased lamotrigine to 300 mg.  Emotional outbursts easily triggered.  I got to do something about the outbursts.   Overall ok with a little moodiness.  Asks about increasing lamotrigine back to 200 BID.   Within last week hair loss stopped.   Anxiety still terrible but I still don't want to add anything else bc I'm on so much. Mostly worry vs physical sx. Ex convinced herself that F would get Covid and had to prepare for it.  No pattern to worry thoughts. No obsessions.  No panic attacks.  Sleep is okay.  Not profoundly depressed.  No hx drug abuse or alcohol abuse..    She reduced the lithium to 900 mg daily bc GI SE.  Somethings causing nausea again.  Higher dosage of lithium with less diarrhea manageable for right now.   Sleep OK. No manic SX.  Didn't realize how unstable she was until feeling stable. Plan: Reduce lamotrigine Back to 200 mg daily.   She agrees to increase the lithium  back to 1200 mg daily.  Wait 1 week then get lithium level BMP and TSH   07/10/19 TC with the following noted:  CO having to wean off [name] again DT cost. MD response:We have had to do this before and historically weaning her off [name] has led to more depression.  I would encourage her to investigate exactly when she meets deductible to ensure that that she wants to take this risk of worsening depression and mood swings. If she feels it necessary to wean off the [name] then cut the dosage in half to 60 mg for 2 weeks and then stop it.  If she has the opportunity to do 60 mg for 2 weeks and then 12:45 120 mg tablet for 2 additional weeks that would be ideal. If her symptoms worsen we  could consider loxapine now that it is available again but it is typically not as effective for depression as is [name].  01/08/20 appt with the following noted: Stopped [name] after the samples.  BC high ded policy   Has been fine off the [name].  Patient reports stable mood and denies depressed moods.  Brief periods of sadness over difficulties with MS.  MS is fairly stable. Still some irritability but usually manageable.  Goes away on her own if feels she'll explode which she does at least 4 times weekly.  Same as on [name].  Patient with chronic manageable anxiety.  Patient denies difficulty with sleep initiation or maintenance. Denies appetite disturbance.  Patient reports that energy and motivation have been good.  Patient denies any difficulty with concentration.  Patient denies any suicidal ideation. Tolerating lithium without GI problems she had in the past. Plan: no med changes. She agrees to continue the lithium back to 1200 mg daily.  Labs good as noted lithium 0.8 on 1200.  04/02/2020 patient called for an urgent appointment today: She called on 03/20/2020 asking for something for anxiety.  Because she was on stimulant we gave her hydroxyzine 10 to 20 mg 3 times daily as needed. Says she doubted bipolar dx and now believes it. "I'm so manic".  Can't stop it.  I'm so mean and I can't stop it.  Tense inside and racing inside. Hydroxyzine didn't help. Recognized mania last week.  Felt high and euphoric.  Not crazy thoughts but impulsive and increased spending of [place]200 in arc[place] yesterday.  Is sleeping bc is exhausted.  Chronic MS fatigue.  Hyperactive.  More research online.  Hypomanic is her self dx.  Increased grandiosity of seeing things more clearly.  Impulsive and irritable alternating with euphoria. Can't stop herself. Been consistent with lithium.  Ran out of lamotrigine several weeks ago and forgot to pick it up.  Close friend noticed the changes and saying she was talking fast. H  doesn't believe in mental health problems. Plan: Disc dx mania which she had previously denied but now clearly recognizes.  She has a history of taking [name] with benefit but stopped it due to cost.  She believes her insurance will cover it now because she has a different policy.  We will start with 60 mg but increase to 120 mg very quickly because she is likely to need the higher dose to manage the mania. She agrees to continue the lithium back to 1200 mg daily.  Labs good as noted lithium 0.8 on 1200. For mania, start [name] 120 mg which she's taken before. Klonopin 1 mg TID until mania resolves  04/11/2020 appointment with the following noted:  Seen urgently to follow-up recent mania.  Seen as a work in appointment. I feel much better. Mania more controlled but still feels it.  Arm had been numb from anxiety and it's better but not gone.  Not racing as much.  Mind is clearer.  Still a little distractible but less.  Still feels a little too upbeat. Extreme diarrhea and lost 8# in the week.  Only change was [name].  The first 4-5 nights was sedated but not last night. Doesn't like clonazepam bc very drowsy with it and only takes it when arm goes numb.  Taking only 1 every 2 days. She agrees to continue the lithium back to 1200 mg daily.  Labs good as noted lithium 0.8 on 1200. For mania, continue  [name] 120 mg which she's taken before.  It's helping. Start immodium prn  For diarrhea. Expect diarrhea will resolve as patient gets more used to the [name].  She started right out at a high dose which is unusual. Klonopin 1 mg TID prn  until mania resolves.  Good that she does not use much bc of [name].   04/16/20 TC's. Pt called and said that she is in the middle of a maniac state. She said that she is taking latuda and klonopin. However the klonopin is making her heart race and she is unable to rest. She has tried cutting it in half and still the same. She would like something else called into her  pharmacy so she can rest She recently switched back to [name] 120 mg which is so far not managing the mania.  She continues on lithium. The clonazepam is not making her heart race.  That is caused by the mania.  However I know she does not like the clonazepam so I will switch it to lorazepam 1 mg every 6 hours as needed agitation or insomnia. To better control the mania we will temporarily add haloperidol 5 mg nightly.  She is always concerned about weight gain and haloperidol does not have a significant risk of headache weight gain and is a good antimanic med.  Once the mania is controlled for a week or 2 then she can stop the haloperidol.   07/02/20 appt noted: Took Haldol 5 mg for a week and it calmed down and stopped it. It caused drowsiness. Manic sx resolved but still real irritable.  Insurance covering [place] so far.  Threw clonazepam away bc didn't seem to help.  Took lorazepam 1 mg and it seemed to have better calming effect.  Irritability in sudden bursts of 30 min and then resolves.  Cannot control it well.  Kind of weepy too.   EFA.  Sleep 7 hours.  Good function. On lithium 1200, [name] 120, [name] 60. Off lamotrigine since October.  GI rx colestipol and diarrhea stopped.  Took Ritalin 10 days without [name] and irritability without change. Tolerating medications  Plan: Add Luvox 50 mg HS for anxiety and irritability.  09/17/20 appt noted: Fluvoxamine for 3 weeks caused worsening runny nose and stopped.  Irritability got better and hasn't returned.  Mood has been good.  Anxiety is fine with nothing off the charts.  Sleep is good. SE only runny nose.  Diarrhea managed with colestipol. Plan: She agrees to continue the lithium back to 1200 mg daily.  08/13/20 lithium 1200= level 1.2 Diarrhea resolved with colestipol. Continue [name] 120  01/19/2021 appointment with the following noted: Doing good.  Pretty stable since here.   CO weight gain eating at  night to take it.  Too sleepy  if takes with dinner. Not had Covid. CO lorazepam 1 mg prn anxiety not being effective.  Using it 3 times weekly. Plan: OK change BZ to infrequent use of Xanax 0.5-1 mg prn anxiety about 3 times per week. DC lorazepam BC ineffective.  05/19/2021 appt noted: Wants to change [name] bc nose running and no sex drive. Mood good without mania nor depression. No increased SE lithium and no tremors.   Not irritability nor agitation. Patient reports stable mood and denies depressed or irritable moods.  Patient denies any recent difficulty with anxiety.  Patient denies difficulty with sleep initiation or maintenance. Denies appetite disturbance.  Patient reports that energy and motivation have been good.  Patient denies any difficulty with concentration.  Patient denies any suicidal ideation.   past psych meds:  [name] 1500 good resp but hair loss,, lithium 1200, CBZ, Equetro helped but GI side effects, Trileptal,  lamotrigine, [name] retrial 10 GI, Rexulti, [name] 120,  Seroquel low dosages with weight gain, Haldol 5 drowsy Zoloft 150, Trintellix, , mirtazapine, Lexapro, doxazosin.  Duloxetine side effects, long history of sertraline, Lexapro headaches, Trintellix,  trazodone, mirtazapine,   Clonazepam less effective than lorazepam for calming affect Remote high dose Xanax 2 mg. Adderall for MS fatigue, [name] for MS fatigue, armodafinil NR, Ritalin NR 20 BID without [name]  Review of Systems:  Review of Systems  Constitutional:  Negative for fatigue and unexpected weight change.  Gastrointestinal:  Negative for diarrhea, nausea and vomiting.  Skin:        Hair loss  Neurological:  Negative for dizziness, tremors and weakness.       Balance problems  Psychiatric/Behavioral:  Negative for agitation, behavioral problems, confusion, decreased concentration, dysphoric mood, hallucinations, self-injury, sleep disturbance and suicidal ideas. The patient is nervous/anxious. The patient is not  hyperactive.  Depression resolved  Medications: I have reviewed the patient's current medications.  Current Outpatient Medications  Medication Sig Dispense Refill   ALPRAZolam (XANAX) 0.5 MG tablet 1-2 TABLETS UP TO 3 TIMES WEEKLY FOR ANXIETY/PANIC 24 tablet 0   Cholecalciferol (VITAMIN D3) 10000 units TABS Take by mouth.     colestipol (COLESTID) 1 g tablet Take 2 g by mouth daily.     [name] 120 MG TABS TAKE 1 TABLET BY MOUTH EVERY DAY 90 tablet 0   lisdexamfetamine ([name]) 60 MG capsule Take 1 capsule (60 mg total) by mouth every morning. 30 capsule 0   lithium carbonate 300 MG capsule Take 4 capsules (1,200 mg total) by mouth at bedtime. 360 capsule 1   ocrelizumab (OCREVUS) 300 MG/10ML injection Inject into the vein.     rizatriptan (MAXALT-MLT) 10 MG disintegrating tablet Take 1 tablet (10 mg total) by mouth as needed for migraine. May repeat in 2 hours if needed 9 tablet 11   SPRINTEC 28 0.25-35 MG-MCG tablet      tirzepatide (MOUNJARO) 7.5 MG/0.5ML Pen Inject 7.5 mg into the skin once a week.     valACYclovir (VALTREX) 1000 MG tablet 2000 mg every 12 hours for 2 doses. 8 tablet 3   atorvastatin (LIPITOR) 20 MG tablet Take 20 mg by mouth daily. (Patient not taking: Reported on 01/19/2021)     metFORMIN (GLUCOPHAGE) 500 MG tablet 1000 mg BID with meals. Needs appt prior to anymore refills. (Patient not taking: Reported on 05/19/2021) 60 tablet 0   PAZEO 0.7 % SOLN PLACE 1 DROP IN [facility] EYE ONCE A DAY (Patient not taking: Reported on  07/02/2020)     No current facility-administered medications for this visit.    Medication Side Effects: .diarrhea resolved.  [name] runny nose worse with fluvoxamine   Allergies: No Known Allergies  Past Medical History:  Diagnosis Date   Anxiety    Depression    Diabetes type 2, controlled (HCC)    Elevated hemoglobin A1c    Hyperlipidemia    Migraine    Multiple sclerosis (HCC)    receives Tysabri infusion q 28 days    Family History   Problem Relation Age of Onset   Diabetes Mother    Hypertension Mother    Dementia Mother    Bipolar disorder Mother    Heart failure Father    Hypertension Father    Heart disease Father    Ovarian cancer Maternal Grandmother    Alzheimer's disease Paternal Grandmother    Skin cancer Maternal Grandfather     Social History   Socioeconomic History   Marital status: Married    Spouse name: Rollen Sox   Number of children: 1   Years of education: MA   Highest education level: Not on file  Occupational History    Employer: [employer]  Tobacco Use   Smoking status: Never   Smokeless tobacco: Never  Vaping Use   Vaping Use: Never used  Substance and Sexual Activity   Alcohol use: Yes    Comment: socially; once a week   Drug use: No   Sexual activity: Yes    Birth control/protection: Pill  Other Topics Concern   Not on file  Social History Narrative   Patient lives at home with family.   Caffeine Use: 1-2 cups daily   No tobacco, recreational drugs. Social drinking only.   Wears her seatbelt, has smoke alarm in the home.   Feels safe in her relationships.   Social Determinants of Health   Financial Resource Strain: Not on file  Food Insecurity: Not on file  Transportation Needs: Not on file  Physical Activity: Not on file  Stress: Not on file  Social Connections: Not on file  Intimate Partner Violence: Not on file    Past Medical History, Surgical history, Social history, and Family history were reviewed and updated as appropriate.   Please see review of systems for further details on the patient's review from today.   Objective:   Physical Exam:  BP 98/71    Pulse 77   Physical Exam Constitutional:      General: She is not in acute distress. Musculoskeletal:        General: No deformity.  Neurological:     Mental Status: She is alert and oriented to person, place, and time.     Cranial Nerves: No dysarthria.     Coordination: Coordination normal.   Psychiatric:        Attention and Perception: Attention and perception normal. She does not perceive auditory or visual hallucinations.        Mood and Affect: Mood is not anxious or depressed. Affect is not labile, blunt, angry, tearful or inappropriate.        Speech: Speech is not rapid and pressured.        Behavior: Behavior is not agitated, aggressive or hyperactive. Behavior is cooperative.        Thought Content: Thought content normal. Thought content is not paranoid or delusional. Thought content does not include homicidal or suicidal ideation. Thought content does not include suicidal plan.  Cognition and Memory: Cognition and memory normal.        Judgment: Judgment normal.     Comments: Insight intact Mania resolved as did irritability     Lab Review:     Component Value Date/Time   NA 135 08/22/2019 0752   NA 137 04/15/2014 1554   K 4.9 08/22/2019 0752   CL 102 08/22/2019 0752   CO2 25 08/22/2019 0752   GLUCOSE 156 (H) 08/22/2019 0752   BUN 14 08/22/2019 0752   BUN 15 04/15/2014 1554   CREATININE 0.85 08/22/2019 0752   CALCIUM 9.7 08/22/2019 0752   PROT 6.2 (L) 10/22/2016 1009   PROT 6.9 12/29/2015 1400   ALBUMIN 3.3 (L) 10/22/2016 1009   ALBUMIN 4.5 12/29/2015 1400   AST 26 10/22/2016 1009   ALT 25 10/22/2016 1009   ALKPHOS 81 10/22/2016 1009   BILITOT 1.0 10/22/2016 1009   BILITOT 0.6 12/29/2015 1400   GFRNONAA >60 12/25/2016 1254   GFRAA >60 12/25/2016 1254       Component Value Date/Time   WBC 8.1 12/25/2016 1254   RBC 4.34 12/25/2016 1254   HGB 12.0 12/25/2016 1254   HGB 11.9 06/30/2016 1122   HCT 35.8 (L) 12/25/2016 1254   HCT 36.2 06/30/2016 1122   PLT 255 12/25/2016 1254   PLT 237 06/30/2016 1122   MCV 82.5 12/25/2016 1254   MCV 81 06/30/2016 1122   MCH 27.6 12/25/2016 1254   MCHC 33.5 12/25/2016 1254   RDW 14.5 12/25/2016 1254   RDW 14.6 06/30/2016 1122   LYMPHSABS 1.6 10/22/2016 1009   LYMPHSABS 3.6 (H) 06/30/2016 1122   MONOABS  0.3 10/22/2016 1009   EOSABS 0.1 10/22/2016 1009   EOSABS 0.1 06/30/2016 1122   BASOSABS 0.0 10/22/2016 1009   BASOSABS 0.0 06/30/2016 1122    Lithium Lvl  Date Value Ref Range Status  04/06/2021 1.4 (H) 0.6 - 1.2 mmol/L Final  08/13/20 lithium 1200= level 1.2 No results found for: PHENYTOIN, PHENOBARB, VALPROATE, CBMZ   .res Assessment: Plan:     There are no diagnoses linked to this encounter.   Greater than 50% of 30-minute non face to face time with patient was spent on counseling and coordination of care. We discussed her chronically unstable bipolar disorder with a history of abrupt worsening of depression with suicidal ideation.  Most recently she has been manic.     Overall mood sx stable at present.   CO runny nose and SE [name] 120 mg daily.  Did ok without it briefly in past  Trial reduction [name] to 60 mg daily. If starts having mood sx start Vrayalar 1.5 mg and increase to 3 mg daily.  Of note, the hx irritability is not due to [name] because she switched to Ritalin and the irritability was unchanged. [name] being taken from neuro for MS fatigue.  MS stable.  She agrees to continue the lithium back to 1200 mg daily.  Lithium level slightly elevated at 1.4.  Previous level 1.2.  TSH slightly elevated at 5.48 with previous level 5.1.  Don't decrease bc tolerated and reducing Latuday. Diarrhea resolved with colestipol. Counseled patient regarding potential benefits, risks, and side effects of lithium to include potential risk of lithium affecting thyroid and renal function.  Discussed need for periodic lab monitoring to determine drug level and to assess for potential adverse effects.  Counseled patient regarding signs and symptoms of lithium toxicity and advised that they notify office immediately or seek urgent medical attention if experiencing  these signs and symptoms.  Patient advised to contact office with any questions or concerns.  Xanax 0.5-1 mg prn anxiety  about 3 times per week.  Her insomnia is managed  Follow-up 2 mos  Meredith Staggers MD, DFAPA   Please see After Visit Summary for patient specific instructions.  No future appointments.   No orders of the defined types were placed in this encounter.      -------------------------------

## 2021-06-10 ENCOUNTER — Other Ambulatory Visit: Payer: Self-pay | Admitting: Psychiatry

## 2021-06-10 DIAGNOSIS — F4001 Agoraphobia with panic disorder: Secondary | ICD-10-CM

## 2021-07-09 ENCOUNTER — Other Ambulatory Visit: Payer: Self-pay | Admitting: Psychiatry

## 2021-07-09 DIAGNOSIS — F4001 Agoraphobia with panic disorder: Secondary | ICD-10-CM

## 2021-07-12 ENCOUNTER — Other Ambulatory Visit: Payer: Self-pay | Admitting: Psychiatry

## 2021-07-12 DIAGNOSIS — F3112 Bipolar disorder, current episode manic without psychotic features, moderate: Secondary | ICD-10-CM

## 2021-07-22 ENCOUNTER — Encounter: Payer: Self-pay | Admitting: Psychiatry

## 2021-07-22 ENCOUNTER — Ambulatory Visit (INDEPENDENT_AMBULATORY_CARE_PROVIDER_SITE_OTHER): Payer: 59 | Admitting: Psychiatry

## 2021-07-22 ENCOUNTER — Other Ambulatory Visit: Payer: Self-pay

## 2021-07-22 DIAGNOSIS — F902 Attention-deficit hyperactivity disorder, combined type: Secondary | ICD-10-CM

## 2021-07-22 DIAGNOSIS — F411 Generalized anxiety disorder: Secondary | ICD-10-CM

## 2021-07-22 DIAGNOSIS — Z79899 Other long term (current) drug therapy: Secondary | ICD-10-CM

## 2021-07-22 DIAGNOSIS — F5105 Insomnia due to other mental disorder: Secondary | ICD-10-CM

## 2021-07-22 DIAGNOSIS — F3112 Bipolar disorder, current episode manic without psychotic features, moderate: Secondary | ICD-10-CM

## 2021-07-22 DIAGNOSIS — F4001 Agoraphobia with panic disorder: Secondary | ICD-10-CM | POA: Diagnosis not present

## 2021-07-22 MED ORDER — LITHIUM CARBONATE 300 MG PO CAPS: 1200.0000 mg | ORAL_CAPSULE | Freq: Every day | ORAL | 1 refills | Status: DC

## 2021-07-22 MED ORDER — ALPRAZOLAM 0.5 MG PO TABS: 0.5000 mg | ORAL_TABLET | Freq: Three times a day (TID) | ORAL | 2 refills | Status: DC | PRN

## 2021-07-22 MED ORDER — LURASIDONE HCL 60 MG PO TABS: 60.0000 mg | ORAL_TABLET | Freq: Every day | ORAL | 1 refills | Status: DC

## 2021-07-22 NOTE — Progress Notes (Signed)
[name] ?[id number] ?[birth year] ?41 y.o. ? ? ?Subjective:  ? ?Patient ID:  Anita Black is a 41 y.o. (DOB 08-Sep-1980) female. ? ?Chief Complaint:  ?Chief Complaint  ?Patient presents with  ? Follow-up  ?  Bipolar 1 disorder, manic, moderate (HCC)  ? Anxiety  ? ? ?Anxiety ?Symptoms include nervous/anxious behavior. Patient reports no confusion, decreased concentration, dizziness, nausea or suicidal ideas.  ? ? ?Depression ?       Associated symptoms include no decreased concentration, no fatigue and no suicidal ideas.  Past medical history includes anxiety.    ?Anita Black presents to the office today for follow-up of chronically unstable bipolar disorder and anxiety. ? ?In March 26 , 2020.  She had been switched to [name] and had a good response.  ? ?When seen November 29, 2018 which she had reduced [name] on her own to 500 mg daily because of hair loss.  She was cautioned that this would probably not be enough medication to maintain mood stability but she did not want to make any medicine changes or increase the dose back up she was encouraged to take vitamins which often help with [name] related hair loss eventually. ? ?visit December 12, 2018.  She had stopped [name] on her own due to hair loss and refused to consider taking vitamins to counteract this hair loss despite the fact that the [name] had been effective and she has had a very treatment resistant course with multiple med failures.  She asked about taking [name].  [name] was started at 5 mg a day for 1 week to increase then to 10 mg daily as a mood stabilizer.  She went up quicker.   ? ?It's been good.  Less irritable.  No SE.  Not manic.  Not much depression.  No change in chronic anxiety.  Sleep is the same and is OK.  Pretty level.  No worsenging mood sx with switch to [name] 10.  She's aware it is a low bipolar dose but satisfied.   ? ?seen January 10, 2019.  She had seen improvement with [name] 10 mg and so no meds were  changed. ?seen May 07, 2019.  She had stopped [name] 3-4 weeks later dT stomach pain which resolved off the meds.  Still taking [name] 120 daily, lamtrogine 200, lithium 900, [name] 60 daily.  We increased lamotrigine to 300 mg daily to try to help with ongoing mood instability. ? ?07/04/19 appt with the followiing noted: ?No difference with increased lamotrigine to 300 mg.  Emotional outbursts easily triggered.  I got to do something about the outbursts.   ?Overall ok with a little moodiness.  Asks about increasing lamotrigine back to 200 BID.   ?Within last week hair loss stopped.   ?Anxiety still terrible but I still don't want to add anything else bc I'm on so much. Mostly worry vs physical sx. Ex convinced herself that F would get Covid and had to prepare for it.  No pattern to worry thoughts. No obsessions.  No panic attacks.  Sleep is okay.  Not profoundly depressed. ? No hx drug abuse or alcohol abuse.[name]    ?She reduced the lithium to 900 mg daily bc GI SE.  Somethings causing nausea again.  Higher dosage of lithium with less diarrhea manageable for right now.   ?Sleep OK. No manic SX.  Didn't realize how unstable she was until feeling stable. ?Plan: Reduce lamotrigine Back to 200 mg daily.   ?She agrees to  increase the lithium back to 1200 mg daily.  ?Wait 1 week then get lithium level BMP and TSH  ? ?07/10/19 TC with the following noted:  ?CO having to wean off [name] again DT cost. ?MD response:We have had to do this before and historically weaning her off [name] has led to more depression.  I would encourage her to investigate exactly when she meets deductible to ensure that that she wants to take this risk of worsening depression and mood swings. ?If she feels it necessary to wean off the [name] then cut the dosage in half to 60 mg for 2 weeks and then stop it.  If she has the opportunity to do 60 mg for 2 weeks and then 12:45 120 mg tablet for 2 additional weeks that would be ideal. ?If her symptoms  worsen we could consider loxapine now that it is available again but it is typically not as effective for depression as is [name]. ? ?01/08/20 appt with the following noted: ?Stopped [name] after the samples.  BC high ded policy   Has been fine off the [name].  ?Patient reports stable mood and denies depressed moods.  Brief periods of sadness over difficulties with MS.  MS is fairly stable. Still some irritability but usually manageable.  Goes away on her own if feels she'll explode which she does at least 4 times weekly.  Same as on [name].  Patient with chronic manageable anxiety.  Patient denies difficulty with sleep initiation or maintenance. Denies appetite disturbance.  Patient reports that energy and motivation have been good.  Patient denies any difficulty with concentration.  Patient denies any suicidal ideation. ?Tolerating lithium without GI problems she had in the past. ?Plan: no med changes. ?She agrees to continue the lithium back to 1200 mg daily.  ?Labs good as noted lithium 0.8 on 1200. ? ?04/02/2020 patient called for an urgent appointment today: ?She called on 03/20/2020 asking for something for anxiety.  Because she was on stimulant we gave her hydroxyzine 10 to 20 mg 3 times daily as needed. ?Says she doubted bipolar dx and now believes it. ?"I'm so manic".  Can't stop it.  I'm so mean and I can't stop it.  Tense inside and racing inside. ?Hydroxyzine didn't help. ?Recognized mania last week.  Felt high and euphoric.  Not crazy thoughts but impulsive and increased spending of $200 in arcade yesterday.  Is sleeping bc is exhausted.  Chronic MS fatigue.  Hyperactive.  More research online.  Hypomanic is her self dx.  Increased grandiosity of seeing things more clearly.  Impulsive and irritable alternating with euphoria. Can't stop herself. ?Been consistent with lithium.  Ran out of lamotrigine several weeks ago and forgot to pick it up.  Close friend noticed the changes and saying she was talking  fast. ?H doesn't believe in mental health problems. ?Plan: Disc dx mania which she had previously denied but now clearly recognizes.  She has a history of taking [name] with benefit but stopped it due to cost.  She believes her insurance will cover it now because she has a different policy.  We will start with 60 mg but increase to 120 mg very quickly because she is likely to need the higher dose to manage the mania. ?She agrees to continue the lithium back to 1200 mg daily.  ?Labs good as noted lithium 0.8 on 1200. ?For mania, start [name] 120 mg which she's taken before. ?Klonopin 1 mg TID until mania resolves ? ?04/11/2020 appointment with  the following noted: ?Seen urgently to follow-up recent mania.  Seen as a work in appointment. ?I feel much better. Mania more controlled but still feels it.  Arm had been numb from anxiety and it's better but not gone.  Not racing as much.  Mind is clearer.  Still a little distractible but less.  Still feels a little too upbeat. ?Extreme diarrhea and lost 8# in the week.  Only change was [name].  The first 4-5 nights was sedated but not last night. ?Doesn't like clonazepam bc very drowsy with it and only takes it when arm goes numb.  Taking only 1 every 2 days. ?She agrees to continue the lithium back to 1200 mg daily.  ?Labs good as noted lithium 0.8 on 1200. ?For mania, continue  [name] 120 mg which she's taken before.  It's helping. ?Start immodium prn  For diarrhea. ?Expect diarrhea will resolve as patient gets more used to the [name].  She started right out at a high dose which is unusual. ?Klonopin 1 mg TID prn  until mania resolves.  Good that she does not use much bc of [name].  ? ?04/16/20 TC's. ?Pt called and said that she is in the middle of a maniac state. She said that she is taking latuda and klonopin. However the klonopin is making her heart race and she is unable to rest. She has tried cutting it in half and still the same. She would like something else called  into her pharmacy so she can rest ?She recently switched back to [name] 120 mg which is so far not managing the mania.  She continues on lithium. ?The clonazepam is not making her heart race.  That is caused

## 2021-07-25 LAB — TSH: TSH: 4.22 mIU/L

## 2021-07-25 LAB — LITHIUM LEVEL: Lithium Lvl: 1.4 mmol/L — ABNORMAL HIGH (ref 0.6–1.2)

## 2021-07-27 NOTE — Progress Notes (Signed)
Patient requires lithium levels in the high normal range.  The level of 1.4 is stable and she is tolerating it.  No change indicated. ?Continue lithium 1200 mg daily. ?TSH better.

## 2021-08-28 ENCOUNTER — Telehealth: Payer: Self-pay | Admitting: Psychiatry

## 2021-08-28 NOTE — Telephone Encounter (Signed)
Pt LVM at 4:50p on 4/20.  I returned call this morning.  She wanted appt to see Dr Clovis Pu asap, but May 11 was the earliest and she said she couldn't wait that long.  She did not say what is wrong.  She doesn't know if it's meds and just asked asked for a return call.  She was sounding teary.  Pls call her back. ? ?Next appt 7/17 ?

## 2021-08-28 NOTE — Telephone Encounter (Signed)
The most reliably effective strategy would be to increase the [name] back to 80 or more milligrams because we know that has worked before.  However she has complained of a runny nose and wanted the dose reduced.  Obviously the lower dose of [name] is not adequate.  As we discussed at the last appointment we can try Vraylar but there is no guarantee that it will work.  If she wants to try something unproven in order to try to avoid the runny nose side effect of [name], then she can pick up some samples of Vraylar 1.5 mg each morning for 1 week and then increase to 3 mg daily and continue the current dose of [name] for now. ?

## 2021-08-28 NOTE — Telephone Encounter (Signed)
Patient said she is depressed, has racing thoughts, and then feels normal. It keeps bouncing back and forth. She said she doesn't feel manic. Her speech is normal rate, but she does describe manic sx. She is able to get to sleep but wakes at 3-4. Sometimes she will just lay in bed. When I called she was unloading the DW. She said she doesn't normally coupon, but has been "couponing like a fiend lately". She said she had been under more stress lately, but did not provide details. She has thoughts of why bothering living, but has no active plan. She said she had burned a lot of bridges this week because she can't seem to control what she is saying. She is tearful throughout the conversation.  ?

## 2021-08-31 NOTE — Telephone Encounter (Signed)
LVM to RC 

## 2021-08-31 NOTE — Telephone Encounter (Signed)
Recommendations provided to the patient.  Pulled samples and she will come pick them up tomorrow.  ?

## 2021-09-07 ENCOUNTER — Ambulatory Visit (INDEPENDENT_AMBULATORY_CARE_PROVIDER_SITE_OTHER): Payer: 59 | Admitting: Psychiatry

## 2021-09-07 ENCOUNTER — Encounter: Payer: Self-pay | Admitting: Psychiatry

## 2021-09-07 VITALS — BP 124/78 | HR 80

## 2021-09-07 DIAGNOSIS — F3160 Bipolar disorder, current episode mixed, unspecified: Secondary | ICD-10-CM

## 2021-09-07 DIAGNOSIS — R197 Diarrhea, unspecified: Secondary | ICD-10-CM

## 2021-09-07 DIAGNOSIS — Z79899 Other long term (current) drug therapy: Secondary | ICD-10-CM

## 2021-09-07 DIAGNOSIS — F411 Generalized anxiety disorder: Secondary | ICD-10-CM | POA: Diagnosis not present

## 2021-09-07 DIAGNOSIS — F902 Attention-deficit hyperactivity disorder, combined type: Secondary | ICD-10-CM

## 2021-09-07 DIAGNOSIS — F5105 Insomnia due to other mental disorder: Secondary | ICD-10-CM | POA: Diagnosis not present

## 2021-09-07 DIAGNOSIS — F4001 Agoraphobia with panic disorder: Secondary | ICD-10-CM

## 2021-09-07 NOTE — Progress Notes (Signed)
Anita Black ?[id number] ?February 09, 1981 ?41 y.o. ? ? ?Subjective:  ? ?Patient ID:  Anita Black is a 41 y.o. (DOB 15-Jul-1980) female. ? ?Chief Complaint:  ?Chief Complaint  ?Patient presents with  ? Follow-up  ? Bipolar 1 disorder, manic, moderate   ? Medication Problem  ? Manic Behavior  ? Depression  ? ? ?Anxiety ?Symptoms include nervous/anxious behavior. Patient reports no confusion, decreased concentration, dizziness, nausea or suicidal ideas.  ? ? ?Depression ?       Associated symptoms include fatigue.  Associated symptoms include no decreased concentration and no suicidal ideas.  Past medical history includes anxiety.    ?Anita Black presents to the office today for follow-up of chronically unstable bipolar disorder and anxiety. ? ?In March 26 , 2020.  She had been switched to [name] and had a good response.  ? ?When seen November 29, 2018 which she had reduced [name] on her own to 500 mg daily because of hair loss.  She was cautioned that this would probably not be enough medication to maintain mood stability but she did not want to make any medicine changes or increase the dose back up she was encouraged to take vitamins which often help with [name] related hair loss eventually. ? ?visit December 12, 2018.  She had stopped [name] on her own due to hair loss and refused to consider taking vitamins to counteract this hair loss despite the fact that the [name] had been effective and she has had a very treatment resistant course with multiple med failures.  She asked about taking [name].  [name] was started at 5 mg a day for 1 week to increase then to 10 mg daily as a mood stabilizer.  She went up quicker.   ? ?It's been good.  Less irritable.  No SE.  Not manic.  Not much depression.  No change in chronic anxiety.  Sleep is the same and is OK.  Pretty level.  No worsenging mood sx with switch to [name] 10.  She's aware it is a low bipolar dose but satisfied.   ? ?seen January 10, 2019.  She had  seen improvement with [name] 10 mg and so no meds were changed. ?seen May 07, 2019.  She had stopped [name] 3-4 weeks later dT stomach pain which resolved off the meds.  Still taking [name] 120 daily, lamtrogine 200, lithium 900, [name] 60 daily.  We increased lamotrigine to 300 mg daily to try to help with ongoing mood instability. ? ?07/04/19 appt with the followiing noted: ?No difference with increased lamotrigine to 300 mg.  Emotional outbursts easily triggered.  I got to do something about the outbursts.   ?Overall ok with a little moodiness.  Asks about increasing lamotrigine back to 200 BID.   ?Within last week hair loss stopped.   ?Anxiety still terrible but I still don't want to add anything else bc I'm on so much. Mostly worry vs physical sx. Ex convinced herself that F would get Covid and had to prepare for it.  No pattern to worry thoughts. No obsessions.  No panic attacks.  Sleep is okay.  Not profoundly depressed. ? No hx drug abuse or alcohol abuse.[name]    ?She reduced the lithium to 900 mg daily bc GI SE.  Somethings causing nausea again.  Higher dosage of lithium with less diarrhea manageable for right now.   ?Sleep OK. No manic SX.  Didn't realize how unstable she was until feeling stable. ?Plan: Reduce lamotrigine  Back to 200 mg daily.   ?She agrees to increase the lithium back to 1200 mg daily.  ?Wait 1 week then get lithium level BMP and TSH  ? ?07/10/19 TC with the following noted:  ?CO having to wean off [name] again DT cost. ?MD response:We have had to do this before and historically weaning her off [name] has led to more depression.  I would encourage her to investigate exactly when she meets deductible to ensure that that she wants to take this risk of worsening depression and mood swings. ?If she feels it necessary to wean off the [name] then cut the dosage in half to 60 mg for 2 weeks and then stop it.  If she has the opportunity to do 60 mg for 2 weeks and then 12:45 120 mg tablet for  2 additional weeks that would be ideal. ?If her symptoms worsen we could consider loxapine now that it is available again but it is typically not as effective for depression as is [name]. ? ?01/08/20 appt with the following noted: ?Stopped [name] after the samples.  BC high ded policy   Has been fine off the [name].  ?Patient reports stable mood and denies depressed moods.  Brief periods of sadness over difficulties with MS.  MS is fairly stable. Still some irritability but usually manageable.  Goes away on her own if feels she'll explode which she does at least 4 times weekly.  Same as on [name].  Patient with chronic manageable anxiety.  Patient denies difficulty with sleep initiation or maintenance. Denies appetite disturbance.  Patient reports that energy and motivation have been good.  Patient denies any difficulty with concentration.  Patient denies any suicidal ideation. ?Tolerating lithium without GI problems she had in the past. ?Plan: no med changes. ?She agrees to continue the lithium back to 1200 mg daily.  ?Labs good as noted lithium 0.8 on 1200. ? ?04/02/2020 patient called for an urgent appointment today: ?She called on 03/20/2020 asking for something for anxiety.  Because she was on stimulant we gave her hydroxyzine 10 to 20 mg 3 times daily as needed. ?Says she doubted bipolar dx and now believes it. ?"I'm so manic".  Can't stop it.  I'm so mean and I can't stop it.  Tense inside and racing inside. ?Hydroxyzine didn't help. ?Recognized mania last week.  Felt high and euphoric.  Not crazy thoughts but impulsive and increased spending of $200 in arcade yesterday.  Is sleeping bc is exhausted.  Chronic MS fatigue.  Hyperactive.  More research online.  Hypomanic is her self dx.  Increased grandiosity of seeing things more clearly.  Impulsive and irritable alternating with euphoria. Can't stop herself. ?Been consistent with lithium.  Ran out of lamotrigine several weeks ago and forgot to pick it up.   Close friend noticed the changes and saying she was talking fast. ?H doesn't believe in mental health problems. ?Plan: Disc dx mania which she had previously denied but now clearly recognizes.  She has a history of taking [name] with benefit but stopped it due to cost.  She believes her insurance will cover it now because she has a different policy.  We will start with 60 mg but increase to 120 mg very quickly because she is likely to need the higher dose to manage the mania. ?She agrees to continue the lithium back to 1200 mg daily.  ?Labs good as noted lithium 0.8 on 1200. ?For mania, start [name] 120 mg which she's taken before. ?Klonopin  1 mg TID until mania resolves ? ?04/11/2020 appointment with the following noted: ?Seen urgently to follow-up recent mania.  Seen as a work in appointment. ?I feel much better. Mania more controlled but still feels it.  Arm had been numb from anxiety and it's better but not gone.  Not racing as much.  Mind is clearer.  Still a little distractible but less.  Still feels a little too upbeat. ?Extreme diarrhea and lost 8# in the week.  Only change was [name].  The first 4-5 nights was sedated but not last night. ?Doesn't like clonazepam bc very drowsy with it and only takes it when arm goes numb.  Taking only 1 every 2 days. ?She agrees to continue the lithium back to 1200 mg daily.  ?Labs good as noted lithium 0.8 on 1200. ?For mania, continue  [name] 120 mg which she's taken before.  It's helping. ?Start immodium prn  For diarrhea. ?Expect diarrhea will resolve as patient gets more used to the [name].  She started right out at a high dose which is unusual. ?Klonopin 1 mg TID prn  until mania resolves.  Good that she does not use much bc of [name].  ? ?04/16/20 TC's. ?Pt called and said that she is in the middle of a maniac state. She said that she is taking latuda and klonopin. However the klonopin is making her heart race and she is unable to rest. She has tried cutting it in  half and still the same. She would like something else called into her pharmacy so she can rest ?She recently switched back to [name] 120 mg which is so far not managing the mania.  She continues on lithium.

## 2021-09-28 ENCOUNTER — Ambulatory Visit (INDEPENDENT_AMBULATORY_CARE_PROVIDER_SITE_OTHER): Payer: 59 | Admitting: Psychiatry

## 2021-09-28 ENCOUNTER — Encounter: Payer: Self-pay | Admitting: Psychiatry

## 2021-09-28 DIAGNOSIS — F411 Generalized anxiety disorder: Secondary | ICD-10-CM

## 2021-09-28 DIAGNOSIS — F902 Attention-deficit hyperactivity disorder, combined type: Secondary | ICD-10-CM | POA: Diagnosis not present

## 2021-09-28 DIAGNOSIS — F3160 Bipolar disorder, current episode mixed, unspecified: Secondary | ICD-10-CM

## 2021-09-28 DIAGNOSIS — F4001 Agoraphobia with panic disorder: Secondary | ICD-10-CM

## 2021-09-28 DIAGNOSIS — Z79899 Other long term (current) drug therapy: Secondary | ICD-10-CM

## 2021-09-28 DIAGNOSIS — F5105 Insomnia due to other mental disorder: Secondary | ICD-10-CM

## 2021-09-28 MED ORDER — CLONIDINE HCL 0.1 MG PO TABS: 0.1000 mg | ORAL_TABLET | Freq: Two times a day (BID) | ORAL | 1 refills | Status: DC

## 2021-09-28 MED ORDER — CARIPRAZINE HCL 3 MG PO CAPS: 3.0000 mg | ORAL_CAPSULE | Freq: Every day | ORAL | 1 refills | Status: DC

## 2021-09-28 NOTE — Progress Notes (Signed)
LOMA DUBUQUE [id number] [birth year] 41 y.o.   Subjective:   Patient ID:  Anita Black is a 41 y.o. (DOB Nov 18, 1980) female.  Chief Complaint:  Chief Complaint  Patient presents with   Follow-up   Manic Behavior   Depression   Anxiety    Anxiety Patient reports no confusion, decreased concentration, dizziness, nausea, nervous/anxious behavior or suicidal ideas.    Depression        Associated symptoms include fatigue.  Associated symptoms include no decreased concentration and no suicidal ideas.  Past medical history includes anxiety.    Anita Black presents to the office today for follow-up of chronically unstable bipolar disorder and anxiety.  In March 26 , 2020.  She had been switched to [name] and had a good response.   When seen November 29, 2018 which she had reduced [name] on her own to 500 mg daily because of hair loss.  She was cautioned that this would probably not be enough medication to maintain mood stability but she did not want to make any medicine changes or increase the dose back up she was encouraged to take vitamins which often help with [name] related hair loss eventually.  visit December 12, 2018.  She had stopped [name] on her own due to hair loss and refused to consider taking vitamins to counteract this hair loss despite the fact that the [name] had been effective and she has had a very treatment resistant course with multiple med failures.  She asked about taking Anita Black.  Anita Black was started at 5 mg a day for 1 week to increase then to 10 mg daily as a mood stabilizer.  She went up quicker.    It's been good.  Less irritable.  No SE.  Not manic.  Not much depression.  No change in chronic anxiety.  Sleep is the same and is OK.  Pretty level.  No worsenging mood sx with switch to Anita Black 10.  She's aware it is a low bipolar dose but satisfied.    seen January 10, 2019.  She had seen improvement with Anita Black 10 mg and so no meds were  changed. seen May 07, 2019.  She had stopped Anita Black 3-4 weeks later dT stomach pain which resolved off the meds.  Still taking Anita Black 120 daily, Anita Black 200, Anita Black 900, Anita Black 60 daily.  We increased lamotrigine to 300 mg daily to try to help with ongoing mood instability.  07/04/19 appt with the followiing noted: No difference with increased lamotrigine to 300 mg.  Emotional outbursts easily triggered.  I got to do something about the outbursts.   Overall ok with a little moodiness.  Asks about increasing lamotrigine back to 200 BID.   Within last week hair loss stopped.   Anxiety still terrible but I still don't want to add anything else bc I'm on so much. Mostly worry vs physical sx. Ex convinced herself that F would get Covid and had to prepare for it.  No pattern to worry thoughts. No obsessions.  No panic attacks.  Sleep is okay.  Not profoundly depressed.  No hx drug abuse or alcohol abuse..    She reduced the Anita Black to 900 mg daily bc GI SE.  Somethings causing nausea again.  Higher dosage of Anita Black with less diarrhea manageable for right now.   Sleep OK. No manic SX.  Didn't realize how unstable she was until feeling stable. Plan: Reduce lamotrigine Back to 200 mg daily.   She agrees to increase  the Anita Black back to 1200 mg daily.  Wait 1 week then get Anita Black level BMP and TSH   07/10/19 TC with the following noted:  CO having to wean off Anita Black again DT cost. MD response:We have had to do this before and historically weaning her off Anita Black has led to more depression.  I would encourage her to investigate exactly when she meets deductible to ensure that that she wants to take this risk of worsening depression and mood swings. If she feels it necessary to wean off the Anita Black then cut the dosage in half to 60 mg for 2 weeks and then stop it.  If she has the opportunity to do 60 mg for 2 weeks and then 12:45 120 mg tablet for 2 additional weeks that would be ideal. If her symptoms  worsen we could consider Anita Black now that it is available again but it is typically not as effective for depression as is Anita Black.  01/08/20 appt with the following noted: Stopped Anita Black after the samples.  BC high ded policy   Has been fine off the Anita Black.  Patient reports stable mood and denies depressed moods.  Brief periods of sadness over difficulties with MS.  MS is fairly stable. Still some irritability but usually manageable.  Goes away on her own if feels she'll explode which she does at least 4 times weekly.  Same as on Anita Black.  Patient with chronic manageable anxiety.  Patient denies difficulty with sleep initiation or maintenance. Denies appetite disturbance.  Patient reports that energy and motivation have been good.  Patient denies any difficulty with concentration.  Patient denies any suicidal ideation. Tolerating Anita Black without GI problems she had in the past. Plan: no med changes. She agrees to continue the Anita Black back to 1200 mg daily.  Labs good as noted Anita Black 0.8 on 1200.  04/02/2020 patient called for an urgent appointment today: She called on 03/20/2020 asking for something for anxiety.  Because she was on stimulant we gave her hydroxyzine 10 to 20 mg 3 times daily as needed. Says she doubted bipolar dx and now believes it. "I'm so manic".  Can't stop it.  I'm so mean and I can't stop it.  Tense inside and racing inside. Hydroxyzine didn't help. Recognized mania last week.  Felt high and euphoric.  Not crazy thoughts but impulsive and increased spending of [place]200 in [place] yesterday.  Is sleeping bc is exhausted.  Chronic MS fatigue.  Hyperactive.  More research online.  Hypomanic is her self dx.  Increased grandiosity of seeing things more clearly.  Impulsive and irritable alternating with euphoria. Can't stop herself. Been consistent with Anita Black.  Ran out of lamotrigine several weeks ago and forgot to pick it up.  Close friend noticed the changes and saying she was talking  fast. H doesn't believe in mental health problems. Plan: Disc dx mania which she had previously denied but now clearly recognizes.  She has a history of taking Anita Black with benefit but stopped it due to cost.  She believes her insurance will cover it now because she has a different policy.  We will start with 60 mg but increase to 120 mg very quickly because she is likely to need the higher dose to manage the mania. She agrees to continue the Anita Black back to 1200 mg daily.  Labs good as noted Anita Black 0.8 on 1200. For mania, start Anita Black 120 mg which she's taken before. Klonopin 1 mg TID until mania resolves  04/11/2020 appointment with the  following noted: Seen urgently to follow-up recent mania.  Seen as a work in appointment. I feel much better. Mania more controlled but still feels it.  Arm had been numb from anxiety and it's better but not gone.  Not racing as much.  Mind is clearer.  Still a little distractible but less.  Still feels a little too upbeat. Extreme diarrhea and lost 8# in the week.  Only change was Anita Black.  The first 4-5 nights was sedated but not last night. Doesn't like clonazepam bc very drowsy with it and only takes it when arm goes numb.  Taking only 1 every 2 days. She agrees to continue the Anita Black back to 1200 mg daily.  Labs good as noted Anita Black 0.8 on 1200. For mania, continue  Anita Black 120 mg which she's taken before.  It's helping. Start immodium prn  For diarrhea. Expect diarrhea will resolve as patient gets more used to the Anita Black.  She started right out at a high dose which is unusual. Klonopin 1 mg TID prn  until mania resolves.  Good that she does not use much bc of Anita Black.   04/16/20 TC's. Pt called and said that she is in the middle of a maniac state. She said that she is taking Anita Black and klonopin. However the klonopin is making her heart race and she is unable to rest. She has tried cutting it in half and still the same. She would like something else called  into her pharmacy so she can rest She recently switched back to Anita Black 120 mg which is so far not managing the mania.  She continues on Anita Black. The clonazepam is not making her heart race.  That is caused by the mania.  However I know she does not like the clonazepam so I will switch it to lorazepam 1 mg every 6 hours as needed agitation or insomnia. To better control the mania we will temporarily add haloperidol 5 mg nightly.  She is always concerned about weight gain and haloperidol does not have a significant risk of headache weight gain and is a good antimanic med.  Once the mania is controlled for a week or 2 then she can stop the haloperidol.   07/02/20 appt noted: Took Haldol 5 mg for a week and it calmed down and stopped it. It caused drowsiness. Manic sx resolved but still real irritable.  Insurance covering [place] so far.  Threw clonazepam away bc didn't seem to help.  Took lorazepam 1 mg and it seemed to have better calming effect.  Irritability in sudden bursts of 30 min and then resolves.  Cannot control it well.  Kind of weepy too.   EFA.  Sleep 7 hours.  Good function. On Anita Black 1200, Anita Black 120, Anita Black 60. Off lamotrigine since October.  GI rx colestipol and diarrhea stopped.  Took Ritalin 10 days without Anita Black and irritability without change. Tolerating medications  Plan: Add Luvox 50 mg HS for anxiety and irritability.  09/17/20 appt noted: Fluvoxamine for 3 weeks caused worsening runny nose and stopped.  Irritability got better and hasn't returned.  Mood has been good.  Anxiety is fine with nothing off the charts.  Sleep is good. SE only runny nose.  Diarrhea managed with colestipol. Plan: She agrees to continue the Anita Black back to 1200 mg daily.  08/13/20 Anita Black 1200= level 1.2 Diarrhea resolved with colestipol. Continue Anita Black 120  01/19/2021 appointment with the following noted: Doing good.  Pretty stable since here.   CO weight gain  eating at night to take it.  Too  sleepy if takes with dinner. Not had Covid. CO lorazepam 1 mg prn anxiety not being effective.  Using it 3 times weekly. Plan: OK change BZ to infrequent use of Xanax 0.5-1 mg prn anxiety about 3 times per week. DC lorazepam BC ineffective.  05/19/2021 appt noted: Wants to change Anita Black bc nose running and no sex drive. Mood good without mania nor depression. No increased SE Anita Black and no tremors.   Not irritability nor agitation. Patient reports stable mood and denies depressed or irritable moods.  Patient denies any recent difficulty with anxiety.  Patient denies difficulty with sleep initiation or maintenance. Denies appetite disturbance.  Patient reports that energy and motivation have been good.  Patient denies any difficulty with concentration.  Patient denies any suicidal ideation. Plan: CO runny nose and SE Anita Black 120 mg daily.  Did ok without it briefly in past  Trial reduction Anita Black to 60 mg daily. If starts having mood sx start Vrayalar 1.5 mg and increase to 3 mg daily.  07/22/2021 appointment with the following noted: Less runny nose with less Anita Black at 60 mg daily and feels her personality.   Asks about reducing Anita Black. No diarrhea on Mon[place]u but switched to low dose Ozempic. Mood stable .  Patient reports stable mood and denies depressed or irritable moods.  Patient denies any recent difficulty with anxiety.  Patient denies difficulty with sleep initiation or maintenance. Denies appetite disturbance.  Patient reports that energy and motivation have been good.  Patient denies any difficulty with concentration.  Patient denies any suicidal ideation. Plan: CO runny nose and SE Anita Black 120 mg daily.  Did ok without it briefly in past  Ok with reduction Anita Black to 60 mg daily. If starts having mood sx start Vrayalar 1.5 mg and increase to 3 mg daily.  08/28/2021 phone call with mixed manic symptoms.  With some thoughts of death yes thank you with no suicidal plan. MD response: The  most reliably effective strategy would be to increase the Anita Black back to 80 or more milligrams because we know that has worked before.  However she has complained of a runny nose and wanted the dose reduced.  Obviously the lower dose of Anita Black is not adequate.  As we discussed at the last appointment we can try Vraylar but there is no guarantee that it will work.  If she wants to try something unproven in order to try to avoid the runny nose side effect of Anita Black, then she can pick up some samples of Vraylar 1.5 mg each morning for 1 week and then increase to 3 mg daily and continue the current dose of Anita Black for now.  09/07/21 appt noted:   Did not start Vraylar yet bc had sx. Calmed some now.  Was cycling from fine to cloudy and confused with racing thoughts.  Somewhat exhausted from the hypomania to normal ultra rapid cycling. Asked about dosing andn SE Vraylar and wanted to know before starting it. Burning bridges left and right last week. Plan: We will wean Anita Black over 10 days or so and start Vraylar 1.5 mg for 1 week and then increase to 3 mg daily.  09/28/2021 appointment with the following noted: LOving the Vraylar.  Clearer mind. Mood seems stable and out of the swing she was in. Irritability and depression resolved. SE none so far. Runny nose lessened but didn't resolve. Needs Xanax to go to sleep bc anxious thoughts at night.  Then  awakens 3 and restless thereafter. Goes to bed 930 and enough sleep. Energy chronically low with MS. Better interests with Vraylar bc dulled with Anita Black  past psych meds:  [name] 1500 good resp but hair loss,, Anita Black 1200, CBZ, Equetro helped but GI side effects, Trileptal,  lamotrigine, Anita Black retrial 10 GI, Rexulti,  Anita Black 120 helped but SE runny nose and low libido Seroquel low dosages with weight gain, Haldol 5 drowsy  Zoloft 150, Trintellix, , mirtazapine, Lexapro, Duloxetine side effects, long history of sertraline, Lexapro headaches,  Trintellix,  doxazosin.   trazodone, mirtazapine,   Clonazepam less effective than lorazepam for calming affect Remote high dose Xanax 2 mg.  Adderall for MS fatigue, Anita Black for MS fatigue, armodafinil NR, Ritalin NR 20 BID without Anita Black  Review of Systems:  Review of Systems  Constitutional:  Positive for fatigue. Negative for unexpected weight change.  HENT:         Runny nose  Gastrointestinal:  Negative for diarrhea, nausea and vomiting.  Skin:        Hair loss  Neurological:  Negative for dizziness and tremors.       Balance problems  Psychiatric/Behavioral:  Negative for agitation, behavioral problems, confusion, decreased concentration, dysphoric mood, hallucinations, self-injury, sleep disturbance and suicidal ideas. The patient is not nervous/anxious and is not hyperactive.  Depression resolved  Medications: I have reviewed the patient's current medications.  Current Outpatient Medications  Medication Sig Dispense Refill   ALPRAZolam (XANAX) 0.5 MG tablet Take 1 tablet (0.5 mg total) by mouth 3 (three) times daily as needed for anxiety. 30 tablet 2   atorvastatin (LIPITOR) 20 MG tablet Take 20 mg by mouth daily.     Cholecalciferol (VITAMIN D3) 10000 units TABS Take by mouth.     cloNIDine (CATAPRES) 0.1 MG tablet Take 1 tablet (0.1 mg total) by mouth 2 (two) times daily. 60 tablet 1   colestipol (COLESTID) 1 g tablet Take 2 g by mouth daily.     lisdexamfetamine (Anita Black) 60 MG capsule Take 1 capsule (60 mg total) by mouth every morning. 30 capsule 0   Anita Black carbonate 300 MG capsule Take 4 capsules (1,200 mg total) by mouth at bedtime. 360 capsule 1   ocrelizumab (OCREVUS) 300 MG/10ML injection Inject into the vein.     rizatriptan (MAXALT-MLT) 10 MG disintegrating tablet Take 1 tablet (10 mg total) by mouth as needed for migraine. May repeat in 2 hours if needed 9 tablet 11   SPRINTEC 28 0.25-35 MG-MCG tablet      valACYclovir (VALTREX) 1000 MG tablet 2000 mg every  12 hours for 2 doses. 8 tablet 3   cariprazine (VRAYLAR) 3 MG capsule Take 1 capsule (3 mg total) by mouth daily. 90 capsule 1   No current facility-administered medications for this visit.    Medication Side Effects: .diarrhea resolved.  Anita Black runny nose improved with dose reduction  Allergies: No Known Allergies  Past Medical History:  Diagnosis Date   Anxiety    Depression    Diabetes type 2, controlled (HCC)    Elevated hemoglobin A1c    Hyperlipidemia    Migraine    Multiple sclerosis (HCC)    receives Tysabri infusion q 28 days    Family History  Problem Relation Age of Onset   Diabetes Mother    Hypertension Mother    Dementia Mother    Bipolar disorder Mother    Heart failure Father    Hypertension Father    Heart disease Father  Ovarian cancer Maternal Grandmother    Alzheimer's disease Paternal Grandmother    Skin cancer Maternal Grandfather     Social History   Socioeconomic History   Marital status: Married    Spouse name: Rollen Sox   Number of children: 1   Years of education: MA   Highest education level: Not on file  Occupational History    Employer: [employer]  Tobacco Use   Smoking status: Never   Smokeless tobacco: Never  Vaping Use   Vaping Use: Never used  Substance and Sexual Activity   Alcohol use: Yes    Comment: socially; once a week   Drug use: No   Sexual activity: Yes    Birth control/protection: Pill  Other Topics Concern   Not on file  Social History Narrative   Patient lives at home with family.   Caffeine Use: 1-2 cups daily   No tobacco, recreational drugs. Social drinking only.   Wears her seatbelt, has smoke alarm in the home.   Feels safe in her relationships.   Social Determinants of Health   Financial Resource Strain: Not on file  Food Insecurity: Not on file  Transportation Needs: Not on file  Physical Activity: Not on file  Stress: Not on file  Social Connections: Not on file  Intimate Partner  Violence: Not on file    Past Medical History, Surgical history, Social history, and Family history were reviewed and updated as appropriate.   Please see review of systems for further details on the patient's review from today.   Objective:   Physical Exam:  There were no vitals taken for this visit.  Physical Exam Constitutional:      General: She is not in acute distress. Musculoskeletal:        General: No deformity.  Neurological:     Mental Status: She is alert and oriented to person, place, and time.     Cranial Nerves: No dysarthria.     Coordination: Coordination normal.  Psychiatric:        Attention and Perception: Attention and perception normal. She does not perceive auditory or visual hallucinations.        Mood and Affect: Mood is anxious. Mood is not depressed. Affect is not labile, angry, tearful or inappropriate.        Speech: Speech is not rapid and pressured.        Behavior: Behavior is not agitated, aggressive or hyperactive. Behavior is cooperative.        Thought Content: Thought content normal. Thought content is not paranoid or delusional. Thought content does not include homicidal or suicidal ideation. Thought content does not include suicidal plan.        Cognition and Memory: Cognition and memory normal.        Judgment: Judgment normal.     Comments: Insight intact      Lab Review:     Component Value Date/Time   NA 135 08/22/2019 0752   NA 137 04/15/2014 1554   K 4.9 08/22/2019 0752   CL 102 08/22/2019 0752   CO2 25 08/22/2019 0752   GLUCOSE 156 (H) 08/22/2019 0752   BUN 14 08/22/2019 0752   BUN 15 04/15/2014 1554   CREATININE 0.85 08/22/2019 0752   CALCIUM 9.7 08/22/2019 0752   PROT 6.2 (L) 10/22/2016 1009   PROT 6.9 12/29/2015 1400   ALBUMIN 3.3 (L) 10/22/2016 1009   ALBUMIN 4.5 12/29/2015 1400   AST 26 10/22/2016 1009   ALT 25 10/22/2016  1009   ALKPHOS 81 10/22/2016 1009   BILITOT 1.0 10/22/2016 1009   BILITOT 0.6 12/29/2015  1400   GFRNONAA >60 12/25/2016 1254   GFRAA >60 12/25/2016 1254       Component Value Date/Time   WBC 8.1 12/25/2016 1254   RBC 4.34 12/25/2016 1254   HGB 12.0 12/25/2016 1254   HGB 11.9 06/30/2016 1122   HCT 35.8 (L) 12/25/2016 1254   HCT 36.2 06/30/2016 1122   PLT 255 12/25/2016 1254   PLT 237 06/30/2016 1122   MCV 82.5 12/25/2016 1254   MCV 81 06/30/2016 1122   MCH 27.6 12/25/2016 1254   MCHC 33.5 12/25/2016 1254   RDW 14.5 12/25/2016 1254   RDW 14.6 06/30/2016 1122   LYMPHSABS 1.6 10/22/2016 1009   LYMPHSABS 3.6 (H) 06/30/2016 1122   MONOABS 0.3 10/22/2016 1009   EOSABS 0.1 10/22/2016 1009   EOSABS 0.1 06/30/2016 1122   BASOSABS 0.0 10/22/2016 1009   BASOSABS 0.0 06/30/2016 1122    Anita Black Lvl  Date Value Ref Range Status  07/24/2021 1.4 (H) 0.6 - 1.2 mmol/L Final  08/13/20 Anita Black 1200= level 1.2 No results found for: PHENYTOIN, PHENOBARB, VALPROATE, CBMZ   .res Assessment: Plan:     Lurine was seen today for follow-up, manic behavior, depression and anxiety.  Diagnoses and all orders for this visit:  Bipolar I disorder, most recent episode mixed (HCC) -     cloNIDine (CATAPRES) 0.1 MG tablet; Take 1 tablet (0.1 mg total) by mouth 2 (two) times daily. -     cariprazine (VRAYLAR) 3 MG capsule; Take 1 capsule (3 mg total) by mouth daily.  Panic disorder with agoraphobia -     cloNIDine (CATAPRES) 0.1 MG tablet; Take 1 tablet (0.1 mg total) by mouth 2 (two) times daily.  Generalized anxiety disorder -     cloNIDine (CATAPRES) 0.1 MG tablet; Take 1 tablet (0.1 mg total) by mouth 2 (two) times daily.  Attention deficit hyperactivity disorder (ADHD), combined type  Insomnia due to mental condition  Anita Black use    Greater than 50% of 30-minute face to face time with patient was spent on counseling and coordination of care. We discussed her chronically unstable bipolar disorder with a history of abrupt worsening of depression with suicidal ideation.  Most  recently she has been having mixed ultra rapid cycling. Has tended to be sensitive and have difficulty with various medications and hence has taken multiple medications as noted.   Overall mood sx were stable on Anita Black 120 mg daily but she complained of rhinorrhea which was persistent and the dose was gradually reduced to 60 mg daily at which point she began to have rapid mixed cycling.  She does not want to go back up in the [place] and wants to try something else.  She has not been on Vraylar before.  We discussed the side effects in detail.  Better with Vraylar 3 mg daily.  Based on her history she may have to go higher than that but we need to be cautious because of her history of intolerance of various medications.  Of note, the hx irritability is not due to Anita Black because she switched to Ritalin and the irritability was unchanged. Anita Black being taken from neuro for MS fatigue.  MS stable.  She agrees to continue the Anita Black 1200 mg daily.  Anita Black level slightly elevated at 1.4.  Previous level 1.2.  TSH slightly elevated at 5.48 with previous level 5.1.   Diarrhea resolved with colestipol.  She stopped this bc of other reasons but tolerating the Anita Black. Counseled patient regarding potential benefits, risks, and side effects of Anita Black to include potential risk of Anita Black affecting thyroid and renal function.  Discussed need for periodic lab monitoring to determine drug level and to assess for potential adverse effects.  Counseled patient regarding signs and symptoms of Anita Black toxicity and advised that they notify office immediately or seek urgent medical attention if experiencing these signs and symptoms.  Patient advised to contact office with any questions or concerns. Repeat Anita Black level and TSH bc borderline  Xanax 0.5-1 mg prn anxiety about 3 times per week.  Her insomnia is managed fairly well.  Trial clonidine off label for anxiety and agitation 0.1 mg   Follow-up 1  mos  Meredith Staggers MD, DFAPA   Please see After Visit Summary for patient specific instructions.  Future Appointments  Date Time Provider Department Center  10/27/2021  3:30 PM Cottle, Steva Ready., MD [facility] None  11/23/2021  9:00 AM Cottle, Steva Ready., MD [facility] None     No orders of the defined types were placed in this encounter.      -------------------------------

## 2021-10-01 ENCOUNTER — Other Ambulatory Visit: Payer: Self-pay | Admitting: Psychiatry

## 2021-10-01 DIAGNOSIS — F4001 Agoraphobia with panic disorder: Secondary | ICD-10-CM

## 2021-10-06 ENCOUNTER — Telehealth: Payer: Self-pay

## 2021-10-06 NOTE — Telephone Encounter (Signed)
Prior Approval submitted and approved for VRAYLAR 3 MG effective 10/06/2021-10/06/22 with [facility]. Left pt voicemail with approval

## 2021-10-23 ENCOUNTER — Other Ambulatory Visit: Payer: Self-pay | Admitting: Psychiatry

## 2021-10-23 DIAGNOSIS — F411 Generalized anxiety disorder: Secondary | ICD-10-CM

## 2021-10-23 DIAGNOSIS — F3160 Bipolar disorder, current episode mixed, unspecified: Secondary | ICD-10-CM

## 2021-10-23 DIAGNOSIS — F4001 Agoraphobia with panic disorder: Secondary | ICD-10-CM

## 2021-10-27 ENCOUNTER — Ambulatory Visit (INDEPENDENT_AMBULATORY_CARE_PROVIDER_SITE_OTHER): Payer: 59 | Admitting: Psychiatry

## 2021-10-27 ENCOUNTER — Encounter: Payer: Self-pay | Admitting: Psychiatry

## 2021-10-27 DIAGNOSIS — F4001 Agoraphobia with panic disorder: Secondary | ICD-10-CM

## 2021-10-27 DIAGNOSIS — F5105 Insomnia due to other mental disorder: Secondary | ICD-10-CM

## 2021-10-27 DIAGNOSIS — F3160 Bipolar disorder, current episode mixed, unspecified: Secondary | ICD-10-CM | POA: Diagnosis not present

## 2021-10-27 DIAGNOSIS — F411 Generalized anxiety disorder: Secondary | ICD-10-CM | POA: Diagnosis not present

## 2021-10-27 DIAGNOSIS — Z79899 Other long term (current) drug therapy: Secondary | ICD-10-CM

## 2021-10-27 NOTE — Progress Notes (Signed)
Anita Black [id number] 01-Nov-1980 41 y.o.   Subjective:   Patient ID:  Anita Black is a 41 y.o. (DOB 1981-03-30) female.  Chief Complaint:  Chief Complaint  Patient presents with   Follow-up    Bipolar I disorder, most recent episode mixed (HCC)   Anxiety   Manic Behavior   Depression    Anxiety Patient reports no confusion, decreased concentration, nausea, nervous/anxious behavior or suicidal ideas.    Depression        Associated symptoms include fatigue.  Associated symptoms include no decreased concentration and no suicidal ideas.  Past medical history includes anxiety.     Anita Black presents to the office today for follow-up of chronically unstable bipolar disorder and anxiety.  In March 26 , 2020.  She had been switched to [name] and had a good response.   When seen November 29, 2018 which she had reduced [name] on her own to 500 mg daily because of hair loss.  She was cautioned that this would probably not be enough medication to maintain mood stability but she did not want to make any medicine changes or increase the dose back up she was encouraged to take vitamins which often help with [name] related hair loss eventually.  visit December 12, 2018.  She had stopped [name] on her own due to hair loss and refused to consider taking vitamins to counteract this hair loss despite the fact that the [name] had been effective and she has had a very treatment resistant course with multiple med failures.  She asked about taking [name].  [name] was started at 5 mg a day for 1 week to increase then to 10 mg daily as a mood stabilizer.  She went up quicker.    It's been good.  Less irritable.  No SE.  Not manic.  Not much depression.  No change in chronic anxiety.  Sleep is the same and is OK.  Pretty level.  No worsenging mood sx with switch to [name] 10.  She's aware it is a low bipolar dose but satisfied.    seen January 10, 2019.  She had seen improvement with  [name] 10 mg and so no meds were changed. seen May 07, 2019.  She had stopped [name] 3-4 weeks later dT stomach pain which resolved off the meds.  Still taking [name] 120 daily, lamtrogine 200, lithium 900, [name] 60 daily.  We increased lamotrigine to 300 mg daily to try to help with ongoing mood instability.  07/04/19 appt with the followiing noted: No difference with increased lamotrigine to 300 mg.  Emotional outbursts easily triggered.  I got to do something about the outbursts.   Overall ok with a little moodiness.  Asks about increasing lamotrigine back to 200 BID.   Within last week hair loss stopped.   Anxiety still terrible but I still don't want to add anything else bc I'm on so much. Mostly worry vs physical sx. Ex convinced herself that F would get Covid and had to prepare for it.  No pattern to worry thoughts. No obsessions.  No panic attacks.  Sleep is okay.  Not profoundly depressed.  No hx drug abuse or alcohol abuse..    She reduced the lithium to 900 mg daily bc GI SE.  Somethings causing nausea again.  Higher dosage of lithium with less diarrhea manageable for right now.   Sleep OK. No manic SX.  Didn't realize how unstable she was until feeling stable. Plan: Reduce lamotrigine  Back to 200 mg daily.   She agrees to increase the lithium back to 1200 mg daily.  Wait 1 week then get lithium level BMP and TSH   07/10/19 TC with the following noted:  CO having to wean off [name] again DT cost. MD response:We have had to do this before and historically weaning her off [name] has led to more depression.  I would encourage her to investigate exactly when she meets deductible to ensure that that she wants to take this risk of worsening depression and mood swings. If she feels it necessary to wean off the [name] then cut the dosage in half to 60 mg for 2 weeks and then stop it.  If she has the opportunity to do 60 mg for 2 weeks and then 12:45 120 mg tablet for 2 additional weeks  that would be ideal. If her symptoms worsen we could consider loxapine now that it is available again but it is typically not as effective for depression as is [name].  01/08/20 appt with the following noted: Stopped [name] after the samples.  BC high ded policy   Has been fine off the [name].  Patient reports stable mood and denies depressed moods.  Brief periods of sadness over difficulties with MS.  MS is fairly stable. Still some irritability but usually manageable.  Goes away on her own if feels she'll explode which she does at least 4 times weekly.  Same as on [name].  Patient with chronic manageable anxiety.  Patient denies difficulty with sleep initiation or maintenance. Denies appetite disturbance.  Patient reports that energy and motivation have been good.  Patient denies any difficulty with concentration.  Patient denies any suicidal ideation. Tolerating lithium without GI problems she had in the past. Plan: no med changes. She agrees to continue the lithium back to 1200 mg daily.  Labs good as noted lithium 0.8 on 1200.  04/02/2020 patient called for an urgent appointment today: She called on 03/20/2020 asking for something for anxiety.  Because she was on stimulant we gave her hydroxyzine 10 to 20 mg 3 times daily as needed. Says she doubted bipolar dx and now believes it. "I'm so manic".  Can't stop it.  I'm so mean and I can't stop it.  Tense inside and racing inside. Hydroxyzine didn't help. Recognized mania last week.  Felt high and euphoric.  Not crazy thoughts but impulsive and increased spending of [place]200 in [place] yesterday.  Is sleeping bc is exhausted.  Chronic MS fatigue.  Hyperactive.  More research online.  Hypomanic is her self dx.  Increased grandiosity of seeing things more clearly.  Impulsive and irritable alternating with euphoria. Can't stop herself. Been consistent with lithium.  Ran out of lamotrigine several weeks ago and forgot to pick it up.  Close friend noticed  the changes and saying she was talking fast. H doesn't believe in mental health problems. Plan: Disc dx mania which she had previously denied but now clearly recognizes.  She has a history of taking [name] with benefit but stopped it due to cost.  She believes her insurance will cover it now because she has a different policy.  We will start with 60 mg but increase to 120 mg very quickly because she is likely to need the higher dose to manage the mania. She agrees to continue the lithium back to 1200 mg daily.  Labs good as noted lithium 0.8 on 1200. For mania, start [name] 120 mg which she's taken before. Klonopin  1 mg TID until mania resolves  04/11/2020 appointment with the following noted: Seen urgently to follow-up recent mania.  Seen as a work in appointment. I feel much better. Mania more controlled but still feels it.  Arm had been numb from anxiety and it's better but not gone.  Not racing as much.  Mind is clearer.  Still a little distractible but less.  Still feels a little too upbeat. Extreme diarrhea and lost 8# in the week.  Only change was [name].  The first 4-5 nights was sedated but not last night. Doesn't like clonazepam bc very drowsy with it and only takes it when arm goes numb.  Taking only 1 every 2 days. She agrees to continue the lithium back to 1200 mg daily.  Labs good as noted lithium 0.8 on 1200. For mania, continue  [name] 120 mg which she's taken before.  It's helping. Start immodium prn  For diarrhea. Expect diarrhea will resolve as patient gets more used to the [name].  She started right out at a high dose which is unusual. Klonopin 1 mg TID prn  until mania resolves.  Good that she does not use much bc of [name].   04/16/20 TC's. Pt called and said that she is in the middle of a maniac state. She said that she is taking latuda and klonopin. However the klonopin is making her heart race and she is unable to rest. She has tried cutting it in half and still the same.  She would like something else called into her pharmacy so she can rest She recently switched back to [name] 120 mg which is so far not managing the mania.  She continues on lithium. The clonazepam is not making her heart race.  That is caused by the mania.  However I know she does not like the clonazepam so I will switch it to lorazepam 1 mg every 6 hours as needed agitation or insomnia. To better control the mania we will temporarily add haloperidol 5 mg nightly.  She is always concerned about weight gain and haloperidol does not have a significant risk of headache weight gain and is a good antimanic med.  Once the mania is controlled for a week or 2 then she can stop the haloperidol.   07/02/20 appt noted: Took Haldol 5 mg for a week and it calmed down and stopped it. It caused drowsiness. Manic sx resolved but still real irritable.  Insurance covering [place] so far.  Threw clonazepam away bc didn't seem to help.  Took lorazepam 1 mg and it seemed to have better calming effect.  Irritability in sudden bursts of 30 min and then resolves.  Cannot control it well.  Kind of weepy too.   EFA.  Sleep 7 hours.  Good function. On lithium 1200, [name] 120, [name] 60. Off lamotrigine since October.  GI rx colestipol and diarrhea stopped.  Took Ritalin 10 days without [name] and irritability without change. Tolerating medications  Plan: Add Luvox 50 mg HS for anxiety and irritability.  09/17/20 appt noted: Fluvoxamine for 3 weeks caused worsening runny nose and stopped.  Irritability got better and hasn't returned.  Mood has been good.  Anxiety is fine with nothing off the charts.  Sleep is good. SE only runny nose.  Diarrhea managed with colestipol. Plan: She agrees to continue the lithium back to 1200 mg daily.  08/13/20 lithium 1200= level 1.2 Diarrhea resolved with colestipol. Continue [name] 120  01/19/2021 appointment with the following noted: Doing  good.  Pretty stable since here.   CO weight  gain eating at night to take it.  Too sleepy if takes with dinner. Not had Covid. CO lorazepam 1 mg prn anxiety not being effective.  Using it 3 times weekly. Plan: OK change BZ to infrequent use of Xanax 0.5-1 mg prn anxiety about 3 times per week. DC lorazepam BC ineffective.  05/19/2021 appt noted: Wants to change [name] bc nose running and no sex drive. Mood good without mania nor depression. No increased SE lithium and no tremors.   Not irritability nor agitation. Patient reports stable mood and denies depressed or irritable moods.  Patient denies any recent difficulty with anxiety.  Patient denies difficulty with sleep initiation or maintenance. Denies appetite disturbance.  Patient reports that energy and motivation have been good.  Patient denies any difficulty with concentration.  Patient denies any suicidal ideation. Plan: CO runny nose and SE [name] 120 mg daily.  Did ok without it briefly in past  Trial reduction [name] to 60 mg daily. If starts having mood sx start Vrayalar 1.5 mg and increase to 3 mg daily.  07/22/2021 appointment with the following noted: Less runny nose with less [name] at 60 mg daily and feels her personality.   Asks about reducing lithium. No diarrhea on Mon[place]u but switched to low dose Ozempic. Mood stable .  Patient reports stable mood and denies depressed or irritable moods.  Patient denies any recent difficulty with anxiety.  Patient denies difficulty with sleep initiation or maintenance. Denies appetite disturbance.  Patient reports that energy and motivation have been good.  Patient denies any difficulty with concentration.  Patient denies any suicidal ideation. Plan: CO runny nose and SE [name] 120 mg daily.  Did ok without it briefly in past  Ok with reduction [name] to 60 mg daily. If starts having mood sx start Vrayalar 1.5 mg and increase to 3 mg daily.  08/28/2021 phone call with mixed manic symptoms.  With some thoughts of death yes thank you with  no suicidal plan. MD response: The most reliably effective strategy would be to increase the [name] back to 80 or more milligrams because we know that has worked before.  However she has complained of a runny nose and wanted the dose reduced.  Obviously the lower dose of [name] is not adequate.  As we discussed at the last appointment we can try Vraylar but there is no guarantee that it will work.  If she wants to try something unproven in order to try to avoid the runny nose side effect of [name], then she can pick up some samples of Vraylar 1.5 mg each morning for 1 week and then increase to 3 mg daily and continue the current dose of [name] for now.  09/07/21 appt noted:   Did not start Vraylar yet bc had sx. Calmed some now.  Was cycling from fine to cloudy and confused with racing thoughts.  Somewhat exhausted from the hypomania to normal ultra rapid cycling. Asked about dosing andn SE Vraylar and wanted to know before starting it. Burning bridges left and right last week. Plan: We will wean [name] over 10 days or so and start Vraylar 1.5 mg for 1 week and then increase to 3 mg daily.  09/28/2021 appointment with the following noted: LOving the Vraylar.  Clearer mind. Mood seems stable and out of the swing she was in. Irritability and depression resolved. SE none so far. Runny nose lessened but didn't resolve. Needs Xanax  to go to sleep bc anxious thoughts at night.  Then awakens 3 and restless thereafter. Goes to bed 930 and enough sleep. Energy chronically low with MS. Better interests with Vraylar bc dulled with [name] Plan: Trial clonidine off label for anxiety and agitation 0.1 mg  Continue Vraylar 3 mg daily New alprazolam 0.5 mg 3 times daily as needed anxiety, Continue [name] 60 mg daily for MS fatigue Continue lithium 1200 mg daily  10/27/2021 appointment with the following noted: Still loves the Vraylar and is much better with it.  Irritability is so much better H  notices. Still some anxiety but not extreme, usually. Last week had episode of driving anxiety and had to pull over but it's not always that bad. Takes Xanax in evening and not day bc gets groggy with it. Overall feels the anxiety is manageable though it is worse in the evening and she has to take Xanax in the evening.  However because the mood symptoms are so much more stable she feels like the anxiety is more manageable as well. SE mild restlessness You should put everybody on Vraylar.  More of her personality.  past psych meds:  [name] 1500 good resp but hair loss,, lithium 1200, CBZ, Equetro helped but GI side effects, Trileptal,  lamotrigine, [name] retrial 10 GI, Rexulti, Vraylar  [name] 120 helped but SE runny nose and low libido Seroquel low dosages with weight gain, Haldol 5 drowsy  Zoloft 150, Trintellix, , mirtazapine, Lexapro, Duloxetine side effects, long history of sertraline, Lexapro headaches, Trintellix,  doxazosin.   trazodone, mirtazapine,   Clonazepam less effective than lorazepam not as good as Xanax Remote high dose Xanax 2 mg.  Clonidine 0.1 mg NR and no SE  Adderall for MS fatigue, [name] for MS fatigue, armodafinil NR, Ritalin NR 20 BID without [name]  Review of Systems:  Review of Systems  Constitutional:  Positive for fatigue. Negative for unexpected weight change.  HENT:         Runny nose  Gastrointestinal:  Negative for diarrhea, nausea and vomiting.  Skin:        Hair loss  Neurological:  Negative for tremors.       Balance problems  Psychiatric/Behavioral:  Negative for agitation, behavioral problems, confusion, decreased concentration, dysphoric mood, hallucinations, self-injury, sleep disturbance and suicidal ideas. The patient is not nervous/anxious and is not hyperactive.   Depression resolved  Medications: I have reviewed the patient's current medications.  Current Outpatient Medications  Medication Sig Dispense Refill   ALPRAZolam  (XANAX) 0.5 MG tablet TAKE 1 TABLET BY MOUTH 3 TIMES DAILY AS NEEDED FOR ANXIETY. 30 tablet 1   atorvastatin (LIPITOR) 20 MG tablet Take 20 mg by mouth daily.     cariprazine (VRAYLAR) 3 MG capsule Take 1 capsule (3 mg total) by mouth daily. 90 capsule 1   Cholecalciferol (VITAMIN D3) 10000 units TABS Take by mouth.     colestipol (COLESTID) 1 g tablet Take 2 g by mouth daily.     lisdexamfetamine ([name]) 60 MG capsule Take 1 capsule (60 mg total) by mouth every morning. 30 capsule 0   lithium carbonate 300 MG capsule Take 4 capsules (1,200 mg total) by mouth at bedtime. 360 capsule 1   ocrelizumab (OCREVUS) 300 MG/10ML injection Inject into the vein.     rizatriptan (MAXALT-MLT) 10 MG disintegrating tablet Take 1 tablet (10 mg total) by mouth as needed for migraine. May repeat in 2 hours if needed 9 tablet 11   SPRINTEC 28  0.25-35 MG-MCG tablet      valACYclovir (VALTREX) 1000 MG tablet 2000 mg every 12 hours for 2 doses. 8 tablet 3   cloNIDine (CATAPRES) 0.1 MG tablet Take 1 tablet (0.1 mg total) by mouth 2 (two) times daily. (Patient not taking: Reported on 10/27/2021) 60 tablet 1   No current facility-administered medications for this visit.    Medication Side Effects: .diarrhea resolved.  [name] runny nose improved with dose reduction  Allergies: No Known Allergies  Past Medical History:  Diagnosis Date   Anxiety    Depression    Diabetes type 2, controlled (HCC)    Elevated hemoglobin A1c    Hyperlipidemia    Migraine    Multiple sclerosis (HCC)    receives Tysabri infusion q 28 days    Family History  Problem Relation Age of Onset   Diabetes Mother    Hypertension Mother    Dementia Mother    Bipolar disorder Mother    Heart failure Father    Hypertension Father    Heart disease Father    Ovarian cancer Maternal Grandmother    Alzheimer's disease Paternal Grandmother    Skin cancer Maternal Grandfather     Social History   Socioeconomic History   Marital  status: Married    Spouse name: Rollen Sox   Number of children: 1   Years of education: MA   Highest education level: Not on file  Occupational History    Employer: [employer]  Tobacco Use   Smoking status: Never   Smokeless tobacco: Never  Vaping Use   Vaping Use: Never used  Substance and Sexual Activity   Alcohol use: Yes    Comment: socially; once a week   Drug use: No   Sexual activity: Yes    Birth control/protection: Pill  Other Topics Concern   Not on file  Social History Narrative   Patient lives at home with family.   Caffeine Use: 1-2 cups daily   No tobacco, recreational drugs. Social drinking only.   Wears her seatbelt, has smoke alarm in the home.   Feels safe in her relationships.   Social Determinants of Health   Financial Resource Strain: Not on file  Food Insecurity: Not on file  Transportation Needs: Not on file  Physical Activity: Not on file  Stress: Not on file  Social Connections: Not on file  Intimate Partner Violence: Not on file    Past Medical History, Surgical history, Social history, and Family history were reviewed and updated as appropriate.   Please see review of systems for further details on the patient's review from today.   Objective:   Physical Exam:  There were no vitals taken for this visit.  Physical Exam Constitutional:      General: She is not in acute distress. Musculoskeletal:        General: No deformity.  Neurological:     Mental Status: She is alert and oriented to person, place, and time.     Cranial Nerves: No dysarthria.     Coordination: Coordination normal.  Psychiatric:        Attention and Perception: Attention and perception normal. She does not perceive auditory or visual hallucinations.        Mood and Affect: Mood is anxious. Mood is not depressed. Affect is not labile, angry, tearful or inappropriate.        Speech: Speech is not rapid and pressured.        Behavior: Behavior is not agitated,  aggressive or hyperactive. Behavior is cooperative.        Thought Content: Thought content normal. Thought content is not paranoid or delusional. Thought content does not include homicidal or suicidal ideation. Thought content does not include suicidal plan.        Cognition and Memory: Cognition and memory normal.        Judgment: Judgment normal.     Comments: Insight intact Affective much more positive      Lab Review:     Component Value Date/Time   NA 135 08/22/2019 0752   NA 137 04/15/2014 1554   K 4.9 08/22/2019 0752   CL 102 08/22/2019 0752   CO2 25 08/22/2019 0752   GLUCOSE 156 (H) 08/22/2019 0752   BUN 14 08/22/2019 0752   BUN 15 04/15/2014 1554   CREATININE 0.85 08/22/2019 0752   CALCIUM 9.7 08/22/2019 0752   PROT 6.2 (L) 10/22/2016 1009   PROT 6.9 12/29/2015 1400   ALBUMIN 3.3 (L) 10/22/2016 1009   ALBUMIN 4.5 12/29/2015 1400   AST 26 10/22/2016 1009   ALT 25 10/22/2016 1009   ALKPHOS 81 10/22/2016 1009   BILITOT 1.0 10/22/2016 1009   BILITOT 0.6 12/29/2015 1400   GFRNONAA >60 12/25/2016 1254   GFRAA >60 12/25/2016 1254       Component Value Date/Time   WBC 8.1 12/25/2016 1254   RBC 4.34 12/25/2016 1254   HGB 12.0 12/25/2016 1254   HGB 11.9 06/30/2016 1122   HCT 35.8 (L) 12/25/2016 1254   HCT 36.2 06/30/2016 1122   PLT 255 12/25/2016 1254   PLT 237 06/30/2016 1122   MCV 82.5 12/25/2016 1254   MCV 81 06/30/2016 1122   MCH 27.6 12/25/2016 1254   MCHC 33.5 12/25/2016 1254   RDW 14.5 12/25/2016 1254   RDW 14.6 06/30/2016 1122   LYMPHSABS 1.6 10/22/2016 1009   LYMPHSABS 3.6 (H) 06/30/2016 1122   MONOABS 0.3 10/22/2016 1009   EOSABS 0.1 10/22/2016 1009   EOSABS 0.1 06/30/2016 1122   BASOSABS 0.0 10/22/2016 1009   BASOSABS 0.0 06/30/2016 1122    Lithium Lvl  Date Value Ref Range Status  07/24/2021 1.4 (H) 0.6 - 1.2 mmol/L Final  08/13/20 lithium 1200= level 1.2 No results found for: "PHENYTOIN", "PHENOBARB", "VALPROATE", "CBMZ"    .res Assessment: Plan:     Dorthia was seen today for follow-up, anxiety, manic behavior and depression.  Diagnoses and all orders for this visit:  Bipolar I disorder, most recent episode mixed (HCC)  Panic disorder with agoraphobia  Generalized anxiety disorder  Insomnia due to mental condition  Lithium use    Greater than 50% of 30-minute face to face time with patient was spent on counseling and coordination of care. We discussed her chronically unstable bipolar disorder with a history of abrupt worsening of depression with suicidal ideation.  Most recently she has been having mixed ultra rapid cycling. Has tended to be sensitive and have difficulty with various medications and hence has taken multiple medications as noted.   Overall mood sx were stable on [name] 120 mg daily but she complained of rhinorrhea which was persistent and the dose was gradually reduced to 60 mg daily at which point she began to have rapid mixed cycling.  She does not want to go back up in the [place] and wants to try something else.  She has not been on Vraylar before.  We discussed the side effects in detail.  Much better with Vraylar 3 mg daily.  Based  on her history she may have to go higher than that but we need to be cautious because of her history of intolerance of various medications. She has mild restlessness that is tolerable  Of note, the hx irritability is not due to [name] because she switched to Ritalin and the irritability was unchanged. [name] being taken from neuro for MS fatigue.  MS stable.  She agrees to continue the lithium 1200 mg daily.  Lithium level slightly elevated at 1.4.  Previous level 1.2.  TSH slightly elevated at 5.48 with previous level 5.1.   Diarrhea resolved with colestipol.  She stopped this bc of other reasons but tolerating the lithium. Counseled patient regarding potential benefits, risks, and side effects of lithium to include potential risk of lithium  affecting thyroid and renal function.  Discussed need for periodic lab monitoring to determine drug level and to assess for potential adverse effects.  Counseled patient regarding signs and symptoms of lithium toxicity and advised that they notify office immediately or seek urgent medical attention if experiencing these signs and symptoms.  Patient advised to contact office with any questions or concerns. Repeat lithium level and TSH bc borderline  Xanax 0.5-1 mg prn anxiety about 3 times per week.  Her insomnia is managed fairly well.  Option retrial of clonidine off label for anxiety and agitation at a higher dose than 0.1 mg   Follow-up August  Meredith Staggers MD, DFAPA   Please see After Visit Summary for patient specific instructions.  Future Appointments  Date Time Provider Department Center  11/23/2021  9:00 AM Cottle, Steva Ready., MD [facility] None     No orders of the defined types were placed in this encounter.      -------------------------------

## 2021-11-07 ENCOUNTER — Other Ambulatory Visit: Payer: Self-pay | Admitting: Psychiatry

## 2021-11-07 DIAGNOSIS — F3112 Bipolar disorder, current episode manic without psychotic features, moderate: Secondary | ICD-10-CM

## 2021-11-17 ENCOUNTER — Other Ambulatory Visit: Payer: Self-pay | Admitting: Psychiatry

## 2021-11-17 DIAGNOSIS — F4001 Agoraphobia with panic disorder: Secondary | ICD-10-CM

## 2021-11-17 NOTE — Telephone Encounter (Signed)
Last filled 6/17, due 7/15

## 2021-11-18 ENCOUNTER — Other Ambulatory Visit: Payer: Self-pay | Admitting: Psychiatry

## 2021-11-18 DIAGNOSIS — F4001 Agoraphobia with panic disorder: Secondary | ICD-10-CM

## 2021-11-23 ENCOUNTER — Ambulatory Visit: Payer: Medicare Other | Admitting: Psychiatry

## 2022-01-18 ENCOUNTER — Other Ambulatory Visit: Payer: Self-pay | Admitting: Psychiatry

## 2022-01-18 ENCOUNTER — Ambulatory Visit: Payer: Medicare Other | Admitting: Psychiatry

## 2022-01-18 DIAGNOSIS — F4001 Agoraphobia with panic disorder: Secondary | ICD-10-CM

## 2022-03-17 ENCOUNTER — Encounter: Payer: Self-pay | Admitting: Psychiatry

## 2022-03-17 ENCOUNTER — Ambulatory Visit (INDEPENDENT_AMBULATORY_CARE_PROVIDER_SITE_OTHER): Payer: 59 | Admitting: Psychiatry

## 2022-03-17 VITALS — BP 101/67 | HR 78

## 2022-03-17 DIAGNOSIS — F3160 Bipolar disorder, current episode mixed, unspecified: Secondary | ICD-10-CM | POA: Diagnosis not present

## 2022-03-17 DIAGNOSIS — T50905A Adverse effect of unspecified drugs, medicaments and biological substances, initial encounter: Secondary | ICD-10-CM

## 2022-03-17 DIAGNOSIS — F411 Generalized anxiety disorder: Secondary | ICD-10-CM | POA: Diagnosis not present

## 2022-03-17 DIAGNOSIS — F5105 Insomnia due to other mental disorder: Secondary | ICD-10-CM

## 2022-03-17 DIAGNOSIS — F4001 Agoraphobia with panic disorder: Secondary | ICD-10-CM | POA: Diagnosis not present

## 2022-03-17 DIAGNOSIS — F902 Attention-deficit hyperactivity disorder, combined type: Secondary | ICD-10-CM

## 2022-03-17 DIAGNOSIS — Z79899 Other long term (current) drug therapy: Secondary | ICD-10-CM

## 2022-03-17 DIAGNOSIS — G2571 Drug induced akathisia: Secondary | ICD-10-CM

## 2022-03-17 MED ORDER — PROPRANOLOL HCL 20 MG PO TABS
ORAL_TABLET | ORAL | 2 refills | Status: DC
Start: 2022-03-17 — End: 2022-06-16

## 2022-03-17 NOTE — Progress Notes (Signed)
Anita Black [id number] [birth year] 41 y.o.   Subjective:   Patient ID:  Anita Black is a 41 y.o. (DOB 1980/10/19) female.  Chief Complaint:  Chief Complaint  Patient presents with   Follow-up    Bipolar I disorder, most recent episode mixed (HCC)   Anxiety   ADHD    Anxiety Symptoms include nervous/anxious behavior. Patient reports no confusion, decreased concentration, nausea or suicidal ideas.    Depression        Associated symptoms include fatigue.  Associated symptoms include no decreased concentration and no suicidal ideas.  Past medical history includes anxiety.     Anita Black presents to the office today for follow-up of chronically unstable bipolar disorder and anxiety.  In March 26 , 2020.  She had been switched to [name] and had a good response.   When seen November 29, 2018 which she had reduced [name] on her own to 500 mg daily because of hair loss.  She was cautioned that this would probably not be enough medication to maintain mood stability but she did not want to make any medicine changes or increase the dose back up she was encouraged to take vitamins which often help with [name] related hair loss eventually.  visit December 12, 2018.  She had stopped [name] on her own due to hair loss and refused to consider taking vitamins to counteract this hair loss despite the fact that the [name] had been effective and she has had a very treatment resistant course with multiple med failures.  She asked about taking [name].  [name] was started at 5 mg a day for 1 week to increase then to 10 mg daily as a mood stabilizer.  She went up quicker.    It's been good.  Less irritable.  No SE.  Not manic.  Not much depression.  No change in chronic anxiety.  Sleep is the same and is OK.  Pretty level.  No worsenging mood sx with switch to [name] 10.  She's aware it is a low bipolar dose but satisfied.    seen January 10, 2019.  She had seen improvement with  [name] 10 mg and so no meds were changed. seen May 07, 2019.  She had stopped [name] 3-4 weeks later dT stomach pain which resolved off the meds.  Still taking [name] 120 daily, lamtrogine 200, lithium 900, [name] 60 daily.  We increased lamotrigine to 300 mg daily to try to help with ongoing mood instability.  07/04/19 appt with the followiing noted: No difference with increased lamotrigine to 300 mg.  Emotional outbursts easily triggered.  I got to do something about the outbursts.   Overall ok with a little moodiness.  Asks about increasing lamotrigine back to 200 BID.   Within last week hair loss stopped.   Anxiety still terrible but I still don't want to add anything else bc I'm on so much. Mostly worry vs physical sx. Ex convinced herself that F would get Covid and had to prepare for it.  No pattern to worry thoughts. No obsessions.  No panic attacks.  Sleep is okay.  Not profoundly depressed.  No hx drug abuse or alcohol abuse..    She reduced the lithium to 900 mg daily bc GI SE.  Somethings causing nausea again.  Higher dosage of lithium with less diarrhea manageable for right now.   Sleep OK. No manic SX.  Didn't realize how unstable she was until feeling stable. Plan: Reduce lamotrigine Back to  200 mg daily.   She agrees to increase the lithium back to 1200 mg daily.  Wait 1 week then get lithium level BMP and TSH   07/10/19 TC with the following noted:  CO having to wean off [name] again DT cost. MD response:We have had to do this before and historically weaning her off [name] has led to more depression.  I would encourage her to investigate exactly when she meets deductible to ensure that that she wants to take this risk of worsening depression and mood swings. If she feels it necessary to wean off the [name] then cut the dosage in half to 60 mg for 2 weeks and then stop it.  If she has the opportunity to do 60 mg for 2 weeks and then 12:45 120 mg tablet for 2 additional weeks  that would be ideal. If her symptoms worsen we could consider loxapine now that it is available again but it is typically not as effective for depression as is [name].  01/08/20 appt with the following noted: Stopped [name] after the samples.  BC high ded policy   Has been fine off the [name].  Patient reports stable mood and denies depressed moods.  Brief periods of sadness over difficulties with MS.  MS is fairly stable. Still some irritability but usually manageable.  Goes away on her own if feels she'll explode which she does at least 4 times weekly.  Same as on [name].  Patient with chronic manageable anxiety.  Patient denies difficulty with sleep initiation or maintenance. Denies appetite disturbance.  Patient reports that energy and motivation have been good.  Patient denies any difficulty with concentration.  Patient denies any suicidal ideation. Tolerating lithium without GI problems she had in the past. Plan: no med changes. She agrees to continue the lithium back to 1200 mg daily.  Labs good as noted lithium 0.8 on 1200.  04/02/2020 patient called for an urgent appointment today: She called on 03/20/2020 asking for something for anxiety.  Because she was on stimulant we gave her hydroxyzine 10 to 20 mg 3 times daily as needed. Says she doubted bipolar dx and now believes it. "I'm so manic".  Can't stop it.  I'm so mean and I can't stop it.  Tense inside and racing inside. Hydroxyzine didn't help. Recognized mania last week.  Felt high and euphoric.  Not crazy thoughts but impulsive and increased spending of [place]200 in [place] yesterday.  Is sleeping bc is exhausted.  Chronic MS fatigue.  Hyperactive.  More research online.  Hypomanic is her self dx.  Increased grandiosity of seeing things more clearly.  Impulsive and irritable alternating with euphoria. Can't stop herself. Been consistent with lithium.  Ran out of lamotrigine several weeks ago and forgot to pick it up.  Close friend noticed  the changes and saying she was talking fast. H doesn't believe in mental health problems. Plan: Disc dx mania which she had previously denied but now clearly recognizes.  She has a history of taking [name] with benefit but stopped it due to cost.  She believes her insurance will cover it now because she has a different policy.  We will start with 60 mg but increase to 120 mg very quickly because she is likely to need the higher dose to manage the mania. She agrees to continue the lithium back to 1200 mg daily.  Labs good as noted lithium 0.8 on 1200. For mania, start [name] 120 mg which she's taken before. Klonopin 1 mg  TID until mania resolves  04/11/2020 appointment with the following noted: Seen urgently to follow-up recent mania.  Seen as a work in appointment. I feel much better. Mania more controlled but still feels it.  Arm had been numb from anxiety and it's better but not gone.  Not racing as much.  Mind is clearer.  Still a little distractible but less.  Still feels a little too upbeat. Extreme diarrhea and lost 8# in the week.  Only change was [name].  The first 4-5 nights was sedated but not last night. Doesn't like clonazepam bc very drowsy with it and only takes it when arm goes numb.  Taking only 1 every 2 days. She agrees to continue the lithium back to 1200 mg daily.  Labs good as noted lithium 0.8 on 1200. For mania, continue  [name] 120 mg which she's taken before.  It's helping. Start immodium prn  For diarrhea. Expect diarrhea will resolve as patient gets more used to the [name].  She started right out at a high dose which is unusual. Klonopin 1 mg TID prn  until mania resolves.  Good that she does not use much bc of [name].   04/16/20 TC's. Pt called and said that she is in the middle of a maniac state. She said that she is taking latuda and klonopin. However the klonopin is making her heart race and she is unable to rest. She has tried cutting it in half and still the same.  She would like something else called into her pharmacy so she can rest She recently switched back to [name] 120 mg which is so far not managing the mania.  She continues on lithium. The clonazepam is not making her heart race.  That is caused by the mania.  However I know she does not like the clonazepam so I will switch it to lorazepam 1 mg every 6 hours as needed agitation or insomnia. To better control the mania we will temporarily add haloperidol 5 mg nightly.  She is always concerned about weight gain and haloperidol does not have a significant risk of headache weight gain and is a good antimanic med.  Once the mania is controlled for a week or 2 then she can stop the haloperidol.   07/02/20 appt noted: Took Haldol 5 mg for a week and it calmed down and stopped it. It caused drowsiness. Manic sx resolved but still real irritable.  Insurance covering [place] so far.  Threw clonazepam away bc didn't seem to help.  Took lorazepam 1 mg and it seemed to have better calming effect.  Irritability in sudden bursts of 30 min and then resolves.  Cannot control it well.  Kind of weepy too.   EFA.  Sleep 7 hours.  Good function. On lithium 1200, [name] 120, [name] 60. Off lamotrigine since October.  GI rx colestipol and diarrhea stopped.  Took Ritalin 10 days without [name] and irritability without change. Tolerating medications  Plan: Add Luvox 50 mg HS for anxiety and irritability.  09/17/20 appt noted: Fluvoxamine for 3 weeks caused worsening runny nose and stopped.  Irritability got better and hasn't returned.  Mood has been good.  Anxiety is fine with nothing off the charts.  Sleep is good. SE only runny nose.  Diarrhea managed with colestipol. Plan: She agrees to continue the lithium back to 1200 mg daily.  08/13/20 lithium 1200= level 1.2 Diarrhea resolved with colestipol. Continue [name] 120  01/19/2021 appointment with the following noted: Doing good.  Pretty stable since here.   CO weight  gain eating at night to take it.  Too sleepy if takes with dinner. Not had Covid. CO lorazepam 1 mg prn anxiety not being effective.  Using it 3 times weekly. Plan: OK change BZ to infrequent use of Xanax 0.5-1 mg prn anxiety about 3 times per week. DC lorazepam BC ineffective.  05/19/2021 appt noted: Wants to change [name] bc nose running and no sex drive. Mood good without mania nor depression. No increased SE lithium and no tremors.   Not irritability nor agitation. Patient reports stable mood and denies depressed or irritable moods.  Patient denies any recent difficulty with anxiety.  Patient denies difficulty with sleep initiation or maintenance. Denies appetite disturbance.  Patient reports that energy and motivation have been good.  Patient denies any difficulty with concentration.  Patient denies any suicidal ideation. Plan: CO runny nose and SE [name] 120 mg daily.  Did ok without it briefly in past  Trial reduction [name] to 60 mg daily. If starts having mood sx start Vrayalar 1.5 mg and increase to 3 mg daily.  07/22/2021 appointment with the following noted: Less runny nose with less [name] at 60 mg daily and feels her personality.   Asks about reducing lithium. No diarrhea on Monjoru but switched to low dose Ozempic. Mood stable .  Patient reports stable mood and denies depressed or irritable moods.  Patient denies any recent difficulty with anxiety.  Patient denies difficulty with sleep initiation or maintenance. Denies appetite disturbance.  Patient reports that energy and motivation have been good.  Patient denies any difficulty with concentration.  Patient denies any suicidal ideation. Plan: CO runny nose and SE [name] 120 mg daily.  Did ok without it briefly in past  Ok with reduction [name] to 60 mg daily. If starts having mood sx start Vrayalar 1.5 mg and increase to 3 mg daily.  08/28/2021 phone call with mixed manic symptoms.  With some thoughts of death yes thank you with  no suicidal plan. MD response: The most reliably effective strategy would be to increase the [name] back to 80 or more milligrams because we know that has worked before.  However she has complained of a runny nose and wanted the dose reduced.  Obviously the lower dose of [name] is not adequate.  As we discussed at the last appointment we can try Vraylar but there is no guarantee that it will work.  If she wants to try something unproven in order to try to avoid the runny nose side effect of [name], then she can pick up some samples of Vraylar 1.5 mg each morning for 1 week and then increase to 3 mg daily and continue the current dose of [name] for now.  09/07/21 appt noted:   Did not start Vraylar yet bc had sx. Calmed some now.  Was cycling from fine to cloudy and confused with racing thoughts.  Somewhat exhausted from the hypomania to normal ultra rapid cycling. Asked about dosing andn SE Vraylar and wanted to know before starting it. Burning bridges left and right last week. Plan: We will wean [name] over 10 days or so and start Vraylar 1.5 mg for 1 week and then increase to 3 mg daily.  09/28/2021 appointment with the following noted: LOving the Vraylar.  Clearer mind. Mood seems stable and out of the swing she was in. Irritability and depression resolved. SE none so far. Runny nose lessened but didn't resolve. Needs Xanax to go  to sleep bc anxious thoughts at night.  Then awakens 3 and restless thereafter. Goes to bed 930 and enough sleep. Energy chronically low with MS. Better interests with Vraylar bc dulled with [name] Plan: Trial clonidine off label for anxiety and agitation 0.1 mg  Continue Vraylar 3 mg daily New alprazolam 0.5 mg 3 times daily as needed anxiety, Continue [name] 60 mg daily for MS fatigue Continue lithium 1200 mg daily  10/27/2021 appointment with the following noted: Still loves the Vraylar and is much better with it.  Irritability is so much better H  notices. Still some anxiety but not extreme, usually. Last week had episode of driving anxiety and had to pull over but it's not always that bad. Takes Xanax in evening and not day bc gets groggy with it. Overall feels the anxiety is manageable though it is worse in the evening and she has to take Xanax in the evening.  However because the mood symptoms are so much more stable she feels like the anxiety is more manageable as well. SE mild restlessness You should put everybody on Vraylar.  More of her personality. Plan: no med changes  03/17/22 appt noted: I still think it is a miracle drug.  Some weeks thinks she should go up a dose bc can tell mild cycling but doesn't feel bad.  I can just tell.  But could n't tolerate morte restlessness which is persistent about the same but some days not noticeable but usually only 2-3 hours in the AM and then fine.  Much less irritable with Vraylar. Otherwise SE ok. Thinks may need something more for anxiety.  L arm goes numb with anxiety and did so in her 67s with anxiety and it makes her real scared.  Happens daily in the evening. Takes Xanax in PM when it happens and it lessens the sx. But needs to take 1 mg Xanax to help this. Would like something more to prevent this if possible.   past psych meds:  [name] 1500 good resp but hair loss,, lithium 1200, CBZ, Equetro helped but GI side effects, Trileptal,  lamotrigine, [name] retrial 10 GI, Rexulti, Vraylar  [name] 120 helped but SE runny nose and low libido Seroquel low dosages with weight gain, Haldol 5 drowsy  Zoloft 150, Trintellix, , mirtazapine, Lexapro, Duloxetine side effects, long history of sertraline, Lexapro headaches, Trintellix,  doxazosin.   trazodone, mirtazapine,   Clonazepam less effective than lorazepam not as good as Xanax Remote high dose Xanax 2 mg.  Clonidine 0.1 mg NR and no SE  Adderall for MS fatigue, [name] for MS fatigue, armodafinil NR, Ritalin NR 20 BID without  [name]  Review of Systems:  Review of Systems  Constitutional:  Positive for fatigue. Negative for unexpected weight change.  HENT:         Runny nose  Gastrointestinal:  Negative for diarrhea, nausea and vomiting.  Skin:        Hair loss  Neurological:  Negative for tremors.       Balance problems  Psychiatric/Behavioral:  Negative for agitation, behavioral problems, confusion, decreased concentration, dysphoric mood, hallucinations, self-injury, sleep disturbance and suicidal ideas. The patient is nervous/anxious. The patient is not hyperactive.   Depression resolved  Medications: I have reviewed the patient's current medications.  Current Outpatient Medications  Medication Sig Dispense Refill   ALPRAZolam (XANAX) 0.5 MG tablet TAKE 1-2 TABLET UP TO 3 TIMES WEEKLY FOR ANXIETY/PANIC 30 tablet 1   atorvastatin (LIPITOR) 20 MG tablet Take 20  mg by mouth daily.     cariprazine (VRAYLAR) 3 MG capsule Take 1 capsule (3 mg total) by mouth daily. 90 capsule 1   Cholecalciferol (VITAMIN D3) 10000 units TABS Take by mouth.     colestipol (COLESTID) 1 g tablet Take 2 g by mouth daily.     lisdexamfetamine ([name]) 60 MG capsule Take 1 capsule (60 mg total) by mouth every morning. 30 capsule 0   lithium carbonate 300 MG capsule TAKE 4 CAPSULES BY MOUTH AT BEDTIME. 360 capsule 0   ocrelizumab (OCREVUS) 300 MG/10ML injection Inject into the vein.     propranolol (INDERAL) 20 MG tablet 1-2 tablets twice daily as needed for restlessness or anxiety 120 tablet 2   rizatriptan (MAXALT-MLT) 10 MG disintegrating tablet Take 1 tablet (10 mg total) by mouth as needed for migraine. May repeat in 2 hours if needed 9 tablet 11   SPRINTEC 28 0.25-35 MG-MCG tablet      valACYclovir (VALTREX) 1000 MG tablet 2000 mg every 12 hours for 2 doses. 8 tablet 3   No current facility-administered medications for this visit.    Medication Side Effects: .diarrhea resolved.  [name] runny nose improved with dose  reduction  Allergies: No Known Allergies  Past Medical History:  Diagnosis Date   Anxiety    Depression    Diabetes type 2, controlled (HCC)    Elevated hemoglobin A1c    Hyperlipidemia    Migraine    Multiple sclerosis (HCC)    receives Tysabri infusion q 28 days    Family History  Problem Relation Age of Onset   Diabetes Mother    Hypertension Mother    Dementia Mother    Bipolar disorder Mother    Heart failure Father    Hypertension Father    Heart disease Father    Ovarian cancer Maternal Grandmother    Alzheimer's disease Paternal Grandmother    Skin cancer Maternal Grandfather     Social History   Socioeconomic History   Marital status: Married    Spouse name: Rollen Sox   Number of children: 1   Years of education: MA   Highest education level: Not on file  Occupational History    Employer: [employer]  Tobacco Use   Smoking status: Never   Smokeless tobacco: Never  Vaping Use   Vaping Use: Never used  Substance and Sexual Activity   Alcohol use: Yes    Comment: socially; once a week   Drug use: No   Sexual activity: Yes    Birth control/protection: Pill  Other Topics Concern   Not on file  Social History Narrative   Patient lives at home with family.   Caffeine Use: 1-2 cups daily   No tobacco, recreational drugs. Social drinking only.   Wears her seatbelt, has smoke alarm in the home.   Feels safe in her relationships.   Social Determinants of Health   Financial Resource Strain: Not on file  Food Insecurity: Not on file  Transportation Needs: Not on file  Physical Activity: Not on file  Stress: Not on file  Social Connections: Not on file  Intimate Partner Violence: Not on file    Past Medical History, Surgical history, Social history, and Family history were reviewed and updated as appropriate.   Please see review of systems for further details on the patient's review from today.   Objective:   Physical Exam:  BP 101/67    Pulse 78   Physical Exam Constitutional:  General: She is not in acute distress. Musculoskeletal:        General: No deformity.  Neurological:     Mental Status: She is alert and oriented to person, place, and time.     Cranial Nerves: No dysarthria.     Coordination: Coordination normal.  Psychiatric:        Attention and Perception: Attention and perception normal. She does not perceive auditory or visual hallucinations.        Mood and Affect: Mood is anxious. Mood is not depressed. Affect is not labile, angry or tearful.        Speech: Speech is not rapid and pressured.        Behavior: Behavior is not agitated, aggressive or hyperactive. Behavior is cooperative.        Thought Content: Thought content normal. Thought content is not paranoid or delusional. Thought content does not include homicidal or suicidal ideation. Thought content does not include suicidal plan.        Cognition and Memory: Cognition and memory normal.        Judgment: Judgment normal.     Comments: Insight intact Affective much more positive      Lab Review:     Component Value Date/Time   NA 135 08/22/2019 0752   NA 137 04/15/2014 1554   K 4.9 08/22/2019 0752   CL 102 08/22/2019 0752   CO2 25 08/22/2019 0752   GLUCOSE 156 (H) 08/22/2019 0752   BUN 14 08/22/2019 0752   BUN 15 04/15/2014 1554   CREATININE 0.85 08/22/2019 0752   CALCIUM 9.7 08/22/2019 0752   PROT 6.2 (L) 10/22/2016 1009   PROT 6.9 12/29/2015 1400   ALBUMIN 3.3 (L) 10/22/2016 1009   ALBUMIN 4.5 12/29/2015 1400   AST 26 10/22/2016 1009   ALT 25 10/22/2016 1009   ALKPHOS 81 10/22/2016 1009   BILITOT 1.0 10/22/2016 1009   BILITOT 0.6 12/29/2015 1400   GFRNONAA >60 12/25/2016 1254   GFRAA >60 12/25/2016 1254       Component Value Date/Time   WBC 8.1 12/25/2016 1254   RBC 4.34 12/25/2016 1254   HGB 12.0 12/25/2016 1254   HGB 11.9 06/30/2016 1122   HCT 35.8 (L) 12/25/2016 1254   HCT 36.2 06/30/2016 1122   PLT 255  12/25/2016 1254   PLT 237 06/30/2016 1122   MCV 82.5 12/25/2016 1254   MCV 81 06/30/2016 1122   MCH 27.6 12/25/2016 1254   MCHC 33.5 12/25/2016 1254   RDW 14.5 12/25/2016 1254   RDW 14.6 06/30/2016 1122   LYMPHSABS 1.6 10/22/2016 1009   LYMPHSABS 3.6 (H) 06/30/2016 1122   MONOABS 0.3 10/22/2016 1009   EOSABS 0.1 10/22/2016 1009   EOSABS 0.1 06/30/2016 1122   BASOSABS 0.0 10/22/2016 1009   BASOSABS 0.0 06/30/2016 1122    Lithium Lvl  Date Value Ref Range Status  07/24/2021 1.4 (H) 0.6 - 1.2 mmol/L Final  08/13/20 lithium 1200= level 1.2 No results found for: "PHENYTOIN", "PHENOBARB", "VALPROATE", "CBMZ"   .res Assessment: Plan:     Anita Black was seen today for follow-up, anxiety and adhd.  Diagnoses and all orders for this visit:  Bipolar I disorder, most recent episode mixed (HCC) -     Lithium level  Panic disorder with agoraphobia -     propranolol (INDERAL) 20 MG tablet; 1-2 tablets twice daily as needed for restlessness or anxiety  Attention deficit hyperactivity disorder (ADHD), combined type  Generalized anxiety disorder -  propranolol (INDERAL) 20 MG tablet; 1-2 tablets twice daily as needed for restlessness or anxiety  Insomnia due to mental condition  Lithium use  Acute medication-induced akathisia -     propranolol (INDERAL) 20 MG tablet; 1-2 tablets twice daily as needed for restlessness or anxiety    Greater than 50% of 30-minute face to face time with patient was spent on counseling and coordination of care. We discussed her chronically unstable bipolar disorder with a history of abrupt worsening of depression with suicidal ideation.  Most recently she has been having mixed ultra rapid cycling. Has tended to be sensitive and have difficulty with various medications and hence has taken multiple medications as noted.   Overall mood sx were stable on [name] 120 mg daily but she complained of rhinorrhea which was persistent and the dose was gradually  reduced to 60 mg daily at which point she began to have rapid mixed cycling.  She does not want to go back up in the [place] and wants to try something else.  She has not been on Vraylar before.  We discussed the side effects in detail.  Much better with Vraylar 3 mg daily.  Based on her history she may have to go higher than that but we need to be cautious because of her history of intolerance of various medications. She has mild restlessness that is tolerable  Of note, the hx irritability is not due to [name] because she switched to Ritalin and the irritability was unchanged. [name] being taken from neuro for MS fatigue.  MS stable.  She agrees to continue the lithium 1200 mg daily.  Lithium level slightly elevated at 1.4.  Previous level 1.2.  TSH slightly elevated at 5.48 with previous level 5.1.   Diarrhea resolved with colestipol.  She stopped this bc of other reasons but tolerating the lithium. Counseled patient regarding potential benefits, risks, and side effects of lithium to include potential risk of lithium affecting thyroid and renal function.  Discussed need for periodic lab monitoring to determine drug level and to assess for potential adverse effects.  Counseled patient regarding signs and symptoms of lithium toxicity and advised that they notify office immediately or seek urgent medical attention if experiencing these signs and symptoms.  Patient advised to contact office with any questions or concerns. Repeat lithium level and TSH bc borderline  Xanax 0.5-1 mg prn anxiety   Propranolol 20-40 mg BID prn restlessness and anxiety  Her insomnia is managed fairly well.  Follow-up2-3 mos  Meredith Staggers MD, DFAPA   Please see After Visit Summary for patient specific instructions.  No future appointments.    Orders Placed This Encounter  Procedures   Lithium level       -------------------------------

## 2022-03-26 ENCOUNTER — Other Ambulatory Visit: Payer: Self-pay | Admitting: Psychiatry

## 2022-03-26 DIAGNOSIS — F4001 Agoraphobia with panic disorder: Secondary | ICD-10-CM

## 2022-03-26 NOTE — Telephone Encounter (Signed)
Filled 10/9 appt 2/13

## 2022-04-12 ENCOUNTER — Other Ambulatory Visit: Payer: Self-pay | Admitting: Psychiatry

## 2022-04-12 DIAGNOSIS — F3160 Bipolar disorder, current episode mixed, unspecified: Secondary | ICD-10-CM

## 2022-04-21 ENCOUNTER — Other Ambulatory Visit: Payer: Self-pay | Admitting: Psychiatry

## 2022-04-21 DIAGNOSIS — F3112 Bipolar disorder, current episode manic without psychotic features, moderate: Secondary | ICD-10-CM

## 2022-05-30 ENCOUNTER — Other Ambulatory Visit: Payer: Self-pay | Admitting: Psychiatry

## 2022-05-30 DIAGNOSIS — F4001 Agoraphobia with panic disorder: Secondary | ICD-10-CM

## 2022-05-31 NOTE — Telephone Encounter (Signed)
Filled 12/22 appt 2/13

## 2022-06-10 ENCOUNTER — Telehealth: Payer: Self-pay | Admitting: Psychiatry

## 2022-06-10 NOTE — Telephone Encounter (Signed)
For mania and increase the Vraylar to 6 mg doubling up on the current 3 mg capsules and call us back next week and let us know how she is doing.  For sleep there is not a lot that I can give her that is compatible with the Xanax.  She can take Xanax up to 2 mg at night if it will work. Make sure she is taking 4 capsules of lithium every day and please get the lithium level as soon as possible.

## 2022-06-10 NOTE — Telephone Encounter (Signed)
Patient notified of recommendations. Made her aware of lab order at [facility] for lithium level and told her to get level drawn as close to 12 hours after last dose. She expressed understanding. Will let us know next week how she is doing.

## 2022-06-10 NOTE — Telephone Encounter (Signed)
Pt is experiencing increased mania. Wants to increase her medication dose.

## 2022-06-16 ENCOUNTER — Other Ambulatory Visit: Payer: Self-pay | Admitting: Psychiatry

## 2022-06-16 DIAGNOSIS — F4001 Agoraphobia with panic disorder: Secondary | ICD-10-CM

## 2022-06-16 DIAGNOSIS — F411 Generalized anxiety disorder: Secondary | ICD-10-CM

## 2022-06-16 DIAGNOSIS — T50905A Adverse effect of unspecified drugs, medicaments and biological substances, initial encounter: Secondary | ICD-10-CM

## 2022-06-21 ENCOUNTER — Telehealth: Payer: Self-pay | Admitting: Psychiatry

## 2022-06-21 NOTE — Telephone Encounter (Signed)
Pt LVM @ 1:26p.  When I advised her of Bridget's note, she said she thought so too.  I advised pt to call pharmacy and check with them to see what they gave her in Dec.  She will call back if still a problem.  Next appt 4/2

## 2022-06-21 NOTE — Telephone Encounter (Signed)
Rx for 90-day supply sent 12/5. Patient should not be running out.

## 2022-06-21 NOTE — Telephone Encounter (Signed)
Next visit is 08/10/22. Requesting refill on Vraylar. She has enough for tomorrow. Pharmacy is:  [id number]- [place], [place] - [address]   Phone: [phone] Fax: [phone]   Per pt its in stock.

## 2022-06-22 ENCOUNTER — Ambulatory Visit: Payer: Medicare Other | Admitting: Psychiatry

## 2022-06-23 ENCOUNTER — Ambulatory Visit: Payer: Medicare Other | Admitting: Psychiatry

## 2022-06-25 NOTE — Telephone Encounter (Signed)
Pt called back today at 1pm.  She said she forgot to tell me that Dr Clovis Pu increased the dose to 32m.  That is why she needs refill now.

## 2022-06-28 ENCOUNTER — Other Ambulatory Visit: Payer: Self-pay | Admitting: Psychiatry

## 2022-06-28 DIAGNOSIS — F3160 Bipolar disorder, current episode mixed, unspecified: Secondary | ICD-10-CM

## 2022-06-28 NOTE — Telephone Encounter (Signed)
Pt is taking 6 mg so please send in 6 mg of the vraylar

## 2022-06-30 ENCOUNTER — Other Ambulatory Visit: Payer: Self-pay | Admitting: Psychiatry

## 2022-06-30 ENCOUNTER — Other Ambulatory Visit: Payer: Self-pay

## 2022-06-30 DIAGNOSIS — F4001 Agoraphobia with panic disorder: Secondary | ICD-10-CM

## 2022-06-30 MED ORDER — VRAYLAR 6 MG PO CAPS: 1.0000 | ORAL_CAPSULE | Freq: Every day | ORAL | 0 refills | Status: DC

## 2022-07-15 ENCOUNTER — Telehealth: Payer: Self-pay | Admitting: Psychiatry

## 2022-07-15 NOTE — Telephone Encounter (Signed)
PT has new insurance 2024, [employer], and will need a new PA for [employer]. Her new insurnce is [employer]  ID # [id number], Bin# [id number], RX # [id number]. Can we start a new PA?

## 2022-07-16 ENCOUNTER — Telehealth: Payer: Self-pay

## 2022-07-16 NOTE — Telephone Encounter (Signed)
Prior Approval effective 07/16/2022-05/09/2098 for Vraylar 6 mg with Christella Scheuermann

## 2022-07-16 NOTE — Telephone Encounter (Signed)
Prior Approval received for Vraylar 6 mg effective 07/16/2022-05/09/2098 [id number] with [employer]

## 2022-07-16 NOTE — Telephone Encounter (Signed)
Noted thanks °

## 2022-07-17 ENCOUNTER — Other Ambulatory Visit: Payer: Self-pay | Admitting: Psychiatry

## 2022-07-17 DIAGNOSIS — F3112 Bipolar disorder, current episode manic without psychotic features, moderate: Secondary | ICD-10-CM

## 2022-07-22 ENCOUNTER — Ambulatory Visit (INDEPENDENT_AMBULATORY_CARE_PROVIDER_SITE_OTHER): Payer: 59 | Admitting: Psychiatry

## 2022-07-22 ENCOUNTER — Encounter: Payer: Self-pay | Admitting: Psychiatry

## 2022-07-22 DIAGNOSIS — F4001 Agoraphobia with panic disorder: Secondary | ICD-10-CM

## 2022-07-22 DIAGNOSIS — F5105 Insomnia due to other mental disorder: Secondary | ICD-10-CM

## 2022-07-22 DIAGNOSIS — F3160 Bipolar disorder, current episode mixed, unspecified: Secondary | ICD-10-CM | POA: Diagnosis not present

## 2022-07-22 DIAGNOSIS — F902 Attention-deficit hyperactivity disorder, combined type: Secondary | ICD-10-CM

## 2022-07-22 DIAGNOSIS — Z79899 Other long term (current) drug therapy: Secondary | ICD-10-CM

## 2022-07-22 DIAGNOSIS — F411 Generalized anxiety disorder: Secondary | ICD-10-CM | POA: Diagnosis not present

## 2022-07-22 MED ORDER — VRAYLAR 6 MG PO CAPS: 1.0000 | ORAL_CAPSULE | Freq: Every day | ORAL | 0 refills | Status: DC

## 2022-07-22 MED ORDER — ALPRAZOLAM 0.5 MG PO TABS: ORAL_TABLET | ORAL | 2 refills | Status: DC

## 2022-07-22 NOTE — Progress Notes (Signed)
Anita Black CO:[id number] May 16, 1980 42 y.o.   Subjective:   Patient ID:  Anita Black is a 42 y.o. (DOB Aug 25, 1980) female.  Chief Complaint:  Chief Complaint  Patient presents with   Follow-up   Manic Behavior   Sleeping Problem   Anxiety    Anxiety Symptoms include nervous/anxious behavior. Patient reports no confusion, decreased concentration, nausea or suicidal ideas.    Depression        Associated symptoms include fatigue.  Associated symptoms include no decreased concentration and no suicidal ideas.  Past medical history includes anxiety.     Anita Black presents to the office today for follow-up of chronically unstable bipolar disorder and anxiety.  In March 26 , 2020.  She had been switched to [name] and had a good response.   When seen November 29, 2018 which she had reduced [name] on her own to 500 mg daily because of hair loss.  She was cautioned that this would probably not be enough medication to maintain mood stability but she did not want to make any medicine changes or increase the dose back up she was encouraged to take vitamins which often help with [name] related hair loss eventually.  visit December 12, 2018.  She had stopped [name] on her own due to hair loss and refused to consider taking vitamins to counteract this hair loss despite the fact that the [name] had been effective and she has had a very treatment resistant course with multiple med failures.  She asked about taking [name].  [name] was started at 5 mg a day for 1 week to increase then to 10 mg daily as a mood stabilizer.  She went up quicker.    It's been good.  Less irritable.  No SE.  Not manic.  Not much depression.  No change in chronic anxiety.  Sleep is the same and is OK.  Pretty level.  No worsenging mood sx with switch to [name] 10.  She's aware it is a low bipolar dose but satisfied.    seen January 10, 2019.  She had seen improvement with [name] 10 mg and so no meds  were changed. seen May 07, 2019.  She had stopped [name] 3-4 weeks later dT stomach pain which resolved off the meds.  Still taking Anita Black 120 daily, lamtrogine 200, Anita Black 900, [name] 60 daily.  We increased lamotrigine to 300 mg daily to try to help with ongoing mood instability.  07/04/19 appt with the followiing noted: No difference with increased lamotrigine to 300 mg.  Emotional outbursts easily triggered.  I got to do something about the outbursts.   Overall ok with a little moodiness.  Asks about increasing lamotrigine back to 200 BID.   Within last week hair loss stopped.   Anxiety still terrible but I still don't want to add anything else bc I'm on so much. Mostly worry vs physical sx. Ex convinced herself that F would get Covid and had to prepare for it.  No pattern to worry thoughts. No obsessions.  No panic attacks.  Sleep is okay.  Not profoundly depressed.  No hx drug abuse or alcohol abuse..    She reduced the Anita Black to 900 mg daily bc GI SE.  Somethings causing nausea again.  Higher dosage of Anita Black with less diarrhea manageable for right now.   Sleep OK. No manic SX.  Didn't realize how unstable she was until feeling stable. Plan: Reduce lamotrigine Back to 200 mg daily.   She  agrees to increase the Anita Black back to 1200 mg daily.  Wait 1 week then get Anita Black level BMP and TSH   07/10/19 TC with the following noted:  CO having to wean off Anita Black again DT cost. MD response:We have had to do this before and historically weaning her off Anita Black has led to more depression.  I would encourage her to investigate exactly when she meets deductible to ensure that that she wants to take this risk of worsening depression and mood swings. If she feels it necessary to wean off the Anita Black then cut the dosage in half to 60 mg for 2 weeks and then stop it.  If she has the opportunity to do 60 mg for 2 weeks and then 12:45 120 mg tablet for 2 additional weeks that would be ideal. If her  symptoms worsen we could consider loxapine now that it is available again but it is typically not as effective for depression as is Anita Black.  01/08/20 appt with the following noted: Stopped Anita Black after the samples.  BC high ded policy   Has been fine off the Anita Black.  Patient reports stable mood and denies depressed moods.  Brief periods of sadness over difficulties with MS.  MS is fairly stable. Still some irritability but usually manageable.  Goes away on her own if feels she'll explode which she does at least 4 times weekly.  Same as on Anita Black.  Patient with chronic manageable anxiety.  Patient denies difficulty with sleep initiation or maintenance. Denies appetite disturbance.  Patient reports that energy and motivation have been good.  Patient denies any difficulty with concentration.  Patient denies any suicidal ideation. Tolerating Anita Black without GI problems she had in the past. Plan: no med changes. She agrees to continue the Anita Black back to 1200 mg daily.  Labs good as noted Anita Black 0.8 on 1200.  04/02/2020 patient called for an urgent appointment today: She called on 03/20/2020 asking for something for anxiety.  Because she was on stimulant we gave her hydroxyzine 10 to 20 mg 3 times daily as needed. Says she doubted bipolar dx and now believes it. "I'm so manic".  Can't stop it.  I'm so mean and I can't stop it.  Tense inside and racing inside. Hydroxyzine didn't help. Recognized mania last week.  Felt high and euphoric.  Not crazy thoughts but impulsive and increased spending of [place]200 in [place] yesterday.  Is sleeping bc is exhausted.  Chronic MS fatigue.  Hyperactive.  More research online.  Hypomanic is her self dx.  Increased grandiosity of seeing things more clearly.  Impulsive and irritable alternating with euphoria. Can't stop herself. Been consistent with Anita Black.  Ran out of lamotrigine several weeks ago and forgot to pick it up.  Close friend noticed the changes and saying she was  talking fast. H doesn't believe in mental health problems. Plan: Disc dx mania which she had previously denied but now clearly recognizes.  She has a history of taking Anita Black with benefit but stopped it due to cost.  She believes her insurance will cover it now because she has a different policy.  We will start with 60 mg but increase to 120 mg very quickly because she is likely to need the higher dose to manage the mania. She agrees to continue the Anita Black back to 1200 mg daily.  Labs good as noted Anita Black 0.8 on 1200. For mania, start Anita Black 120 mg which she's taken before. Klonopin 1 mg TID until mania resolves  04/11/2020  appointment with the following noted: Seen urgently to follow-up recent mania.  Seen as a work in appointment. I feel much better. Mania more controlled but still feels it.  Arm had been numb from anxiety and it's better but not gone.  Not racing as much.  Mind is clearer.  Still a little distractible but less.  Still feels a little too upbeat. Extreme diarrhea and lost 8# in the week.  Only change was Anita Black.  The first 4-5 nights was sedated but not last night. Doesn't like clonazepam bc very drowsy with it and only takes it when arm goes numb.  Taking only 1 every 2 days. She agrees to continue the Anita Black back to 1200 mg daily.  Labs good as noted Anita Black 0.8 on 1200. For mania, continue  Anita Black 120 mg which she's taken before.  It's helping. Start immodium prn  For diarrhea. Expect diarrhea will resolve as patient gets more used to the Anita Black.  She started right out at a high dose which is unusual. Klonopin 1 mg TID prn  until mania resolves.  Good that she does not use much bc of [name].   04/16/20 TC's. Pt called and said that she is in the middle of a maniac state. She said that she is taking Anita Black and klonopin. However the klonopin is making her heart race and she is unable to rest. She has tried cutting it in half and still the same. She would like something else  called into her pharmacy so she can rest She recently switched back to Anita Black 120 mg which is so far not managing the mania.  She continues on Anita Black. The clonazepam is not making her heart race.  That is caused by the mania.  However I know she does not like the clonazepam so I will switch it to lorazepam 1 mg every 6 hours as needed agitation or insomnia. To better control the mania we will temporarily add haloperidol 5 mg nightly.  She is always concerned about weight gain and haloperidol does not have a significant risk of headache weight gain and is a good antimanic med.  Once the mania is controlled for a week or 2 then she can stop the haloperidol.   07/02/20 appt noted: Took Haldol 5 mg for a week and it calmed down and stopped it. It caused drowsiness. Manic sx resolved but still real irritable.  Insurance covering [place] so far.  Threw clonazepam away bc didn't seem to help.  Took lorazepam 1 mg and it seemed to have better calming effect.  Irritability in sudden bursts of 30 min and then resolves.  Cannot control it well.  Kind of weepy too.   EFA.  Sleep 7 hours.  Good function. On Anita Black 1200, Anita Black 120, [name] 60. Off lamotrigine since October.  GI rx colestipol and diarrhea stopped.  Took Ritalin 10 days without [name] and irritability without change. Tolerating medications  Plan: Add Luvox 50 mg HS for anxiety and irritability.  09/17/20 appt noted: Fluvoxamine for 3 weeks caused worsening runny nose and stopped.  Irritability got better and hasn't returned.  Mood has been good.  Anxiety is fine with nothing off the charts.  Sleep is good. SE only runny nose.  Diarrhea managed with colestipol. Plan: She agrees to continue the Anita Black back to 1200 mg daily.  08/13/20 Anita Black 1200= level 1.2 Diarrhea resolved with colestipol. Continue Anita Black 120  01/19/2021 appointment with the following noted: Doing good.  Pretty stable since here.  CO weight gain eating at night to take  it.  Too sleepy if takes with dinner. Not had Covid. CO lorazepam 1 mg prn anxiety not being effective.  Using it 3 times weekly. Plan: OK change BZ to infrequent use of Xanax 0.5-1 mg prn anxiety about 3 times per week. DC lorazepam BC ineffective.  05/19/2021 appt noted: Wants to change Anita Black bc nose running and no sex drive. Mood good without mania nor depression. No increased SE Anita Black and no tremors.   Not irritability nor agitation. Patient reports stable mood and denies depressed or irritable moods.  Patient denies any recent difficulty with anxiety.  Patient denies difficulty with sleep initiation or maintenance. Denies appetite disturbance.  Patient reports that energy and motivation have been good.  Patient denies any difficulty with concentration.  Patient denies any suicidal ideation. Plan: CO runny nose and SE Anita Black 120 mg daily.  Did ok without it briefly in past  Trial reduction Anita Black to 60 mg daily. If starts having mood sx start Vrayalar 1.5 mg and increase to 3 mg daily.  07/22/2021 appointment with the following noted: Less runny nose with less Anita Black at 60 mg daily and feels her personality.   Asks about reducing Anita Black. No diarrhea on [place] but switched to low dose Ozempic. Mood stable .  Patient reports stable mood and denies depressed or irritable moods.  Patient denies any recent difficulty with anxiety.  Patient denies difficulty with sleep initiation or maintenance. Denies appetite disturbance.  Patient reports that energy and motivation have been good.  Patient denies any difficulty with concentration.  Patient denies any suicidal ideation. Plan: CO runny nose and SE Anita Black 120 mg daily.  Did ok without it briefly in past  Ok with reduction Anita Black to 60 mg daily. If starts having mood sx start Vrayalar 1.5 mg and increase to 3 mg daily.  08/28/2021 phone call with mixed manic symptoms.  With some thoughts of death yes thank you with no suicidal plan. MD  response: The most reliably effective strategy would be to increase the Anita Black back to 80 or more milligrams because we know that has worked before.  However she has complained of a runny nose and wanted the dose reduced.  Obviously the lower dose of Anita Black is not adequate.  As we discussed at the last appointment we can try Vraylar but there is no guarantee that it will work.  If she wants to try something unproven in order to try to avoid the runny nose side effect of Anita Black, then she can pick up some samples of Vraylar 1.5 mg each morning for 1 week and then increase to 3 mg daily and continue the current dose of Anita Black for now.  09/07/21 appt noted:   Did not start Vraylar yet bc had sx. Calmed some now.  Was cycling from fine to cloudy and confused with racing thoughts.  Somewhat exhausted from the hypomania to normal ultra rapid cycling. Asked about dosing andn SE Vraylar and wanted to know before starting it. Burning bridges left and right last week. Plan: We will wean Anita Black over 10 days or so and start Vraylar 1.5 mg for 1 week and then increase to 3 mg daily.  09/28/2021 appointment with the following noted: LOving the Vraylar.  Clearer mind. Mood seems stable and out of the swing she was in. Irritability and depression resolved. SE none so far. Runny nose lessened but didn't resolve. Needs Xanax to go to sleep bc anxious thoughts at  night.  Then awakens 3 and restless thereafter. Goes to bed 930 and enough sleep. Energy chronically low with MS. Better interests with Vraylar bc dulled with Anita Black Plan: Trial clonidine off label for anxiety and agitation 0.1 mg  Continue Vraylar 3 mg daily New alprazolam 0.5 mg 3 times daily as needed anxiety, Continue [name] 60 mg daily for MS fatigue Continue Anita Black 1200 mg daily  10/27/2021 appointment with the following noted: Still loves the Vraylar and is much better with it.  Irritability is so much better H notices. Still some anxiety but  not extreme, usually. Last week had episode of driving anxiety and had to pull over but it's not always that bad. Takes Xanax in evening and not day bc gets groggy with it. Overall feels the anxiety is manageable though it is worse in the evening and she has to take Xanax in the evening.  However because the mood symptoms are so much more stable she feels like the anxiety is more manageable as well. SE mild restlessness You should put everybody on Vraylar.  More of her personality. Plan: no med changes  03/17/22 appt noted: I still think it is a miracle drug.  Some weeks thinks she should go up a dose bc can tell mild cycling but doesn't feel bad.  I can just tell.  But could n't tolerate morte restlessness which is persistent about the same but some days not noticeable but usually only 2-3 hours in the AM and then fine.  Much less irritable with Vraylar. Otherwise SE ok. Thinks may need something more for anxiety.  L arm goes numb with anxiety and did so in her 36s with anxiety and it makes her real scared.  Happens daily in the evening. Takes Xanax in PM when it happens and it lessens the sx. But needs to take 1 mg Xanax to help this. Would like something more to prevent this if possible. Plan: no med changes  06/10/22 TC: Patient said she is manic. She is only getting 1-2 hours of sleep a night, unable to get to sleep or stay asleep. Feels like she is losing her mind, risky behaviors, acting out of character, feels like she is crawling out of her skin.  Denies irritability, yelling. Denies depression, "happy as a clam." Anxiety is 8/10. No SI.  Meds:  Alprazolam - 1-2 TID, needs that much currently, doesn't need it daily  Propranolol - stopped it because she didn't feel it was helpful  Anita Black 300 mg, 4 capsules at bedtime  Vraylar 3 mg qd - said it was a miracle drug until it wasn't.  Is requesting something for sleep in addition to needing adjustment of meds.  MD response:  For mania and  increase the Vraylar to 6 mg doubling up on the current 3 mg capsules and call us back next week and let us know how she is doing.  For sleep there is not a lot that I can give her that is compatible with the Xanax.  She can take Xanax up to 2 mg at night if it will work. Make sure she is taking 4 capsules of Anita Black every day and please get the Anita Black level as soon as possible.     07/22/22 appt noted: Taking Xanax 0.5 mg HS, Vraylar 6, Anita Black 1200 mg .  No propranolol bc didn't do anything.  I think I'm normal .  Is sleeping again 9-6..  With mania was worst thing Had compulsive gambling $40K online and  didn't care.  Never did it before.  Wasn't eating while manic and then started eating more.   Seems like a blur.   Also spending too much on things she didn't need.  Didn't realize she was manic until 2 weeks in.  Couldn't sleep.  When increased Vraylar gradually better for 3 weeks to baseline.  Best friend said she thought something was off.  Only lately told H she was bipolar.   F has dementia and multiple myeloma and living with them at 42 yo.   No SE.   Mood is still good without depression and mania.   Having a lot of anxiety but not panic.  Worry over wrecks or people dying.  Last few years anxiety over driving or being late.     past psych meds:  [name] 1500 good resp but hair loss,, Anita Black 1200, CBZ, Equetro helped but GI side effects, Trileptal,  lamotrigine, [name] retrial 10 GI, Rexulti, Vraylar  Anita Black 120 helped but SE runny nose and low libido Seroquel low dosages with weight gain, Haldol 5 drowsy  Zoloft 150, Trintellix, , mirtazapine, Lexapro, Duloxetine side effects, long history of sertraline, Lexapro headaches, Trintellix,  doxazosin.   trazodone, mirtazapine,   Ambien too sedated Clonazepam less effective than lorazepam not as good as Xanax Remote high dose Xanax 2 mg.  Clonidine 0.1 mg NR and no SE  Adderall for MS fatigue, [name] for MS fatigue, armodafinil  NR, Ritalin NR 20 BID without [name]  Review of Systems:  Review of Systems  Constitutional:  Positive for fatigue.  HENT:         Runny nose  Gastrointestinal:  Negative for diarrhea, nausea and vomiting.  Skin:        Hair loss  Neurological:  Negative for tremors.       Balance problems  Psychiatric/Behavioral:  Negative for agitation, behavioral problems, confusion, decreased concentration, dysphoric mood, hallucinations, self-injury, sleep disturbance and suicidal ideas. The patient is nervous/anxious. The patient is not hyperactive.   Depression resolved  Medications: I have reviewed the patient's current medications.  Current Outpatient Medications  Medication Sig Dispense Refill   atorvastatin (LIPITOR) 20 MG tablet Take 20 mg by mouth daily.     Cholecalciferol (VITAMIN D3) 10000 units TABS Take by mouth.     colestipol (COLESTID) 1 g tablet Take 2 g by mouth daily.     lisdexamfetamine ([name]) 60 MG capsule Take 1 capsule (60 mg total) by mouth every morning. 30 capsule 0   Anita Black carbonate 300 MG capsule TAKE 4 CAPSULES BY MOUTH AT BEDTIME 120 capsule 0   ocrelizumab (OCREVUS) 300 MG/10ML injection Inject into the vein.     propranolol (INDERAL) 20 MG tablet TAKE 1-2 TABLETS TWICE DAILY AS NEEDED FOR RESTLESSNESS OR ANXIETY 360 tablet 0   rizatriptan (MAXALT-MLT) 10 MG disintegrating tablet Take 1 tablet (10 mg total) by mouth as needed for migraine. May repeat in 2 hours if needed 9 tablet 11   SPRINTEC 28 0.25-35 MG-MCG tablet      valACYclovir (VALTREX) 1000 MG tablet 2000 mg every 12 hours for 2 doses. 8 tablet 3   ALPRAZolam (XANAX) 0.5 MG tablet 1 at night and 1 tablet daily as needed for anxiety 45 tablet 2   Cariprazine HCl (VRAYLAR) 6 MG CAPS Take 1 capsule (6 mg total) by mouth daily. 90 capsule 0   No current facility-administered medications for this visit.    Medication Side Effects: .diarrhea resolved.  Anita Black runny nose improved  with dose  reduction  Allergies: No Known Allergies  Past Medical History:  Diagnosis Date   Anxiety    Depression    Diabetes type 2, controlled (HCC)    Elevated hemoglobin A1c    Hyperlipidemia    Migraine    Multiple sclerosis (HCC)    receives Tysabri infusion q 28 days    Family History  Problem Relation Age of Onset   Diabetes Mother    Hypertension Mother    Dementia Mother    Bipolar disorder Mother    Heart failure Father    Hypertension Father    Heart disease Father    Ovarian cancer Maternal Grandmother    Alzheimer's disease Paternal Grandmother    Skin cancer Maternal Grandfather     Social History   Socioeconomic History   Marital status: Married    Spouse name: Anita Black   Number of children: 1   Years of education: MA   Highest education level: Not on file  Occupational History    Employer: [employer]  Tobacco Use   Smoking status: Never   Smokeless tobacco: Never  Vaping Use   Vaping Use: Never used  Substance and Sexual Activity   Alcohol use: Yes    Comment: socially; once a week   Drug use: No   Sexual activity: Yes    Birth control/protection: Pill  Other Topics Concern   Not on file  Social History Narrative   Patient lives at home with family.   Caffeine Use: 1-2 cups daily   No tobacco, recreational drugs. Social drinking only.   Wears her seatbelt, has smoke alarm in the home.   Feels safe in her relationships.   Social Determinants of Health   Financial Resource Strain: Not on file  Food Insecurity: Not on file  Transportation Needs: Not on file  Physical Activity: Not on file  Stress: Not on file  Social Connections: Not on file  Intimate Partner Violence: Not on file    Past Medical History, Surgical history, Social history, and Family history were reviewed and updated as appropriate.   Please see review of systems for further details on the patient's review from today.   Objective:   Physical Exam:  There were no  vitals taken for this visit.  Physical Exam Constitutional:      General: She is not in acute distress. Musculoskeletal:        General: No deformity.  Neurological:     Mental Status: She is alert and oriented to person, place, and time.     Cranial Nerves: No dysarthria.     Coordination: Coordination normal.  Psychiatric:        Attention and Perception: Attention and perception normal. She does not perceive auditory or visual hallucinations.        Mood and Affect: Mood is anxious. Mood is not depressed. Affect is not labile, angry or tearful.        Speech: Speech is not rapid and pressured.        Behavior: Behavior is not agitated, aggressive or hyperactive. Behavior is cooperative.        Thought Content: Thought content normal. Thought content is not paranoid or delusional. Thought content does not include homicidal or suicidal ideation. Thought content does not include suicidal plan.        Cognition and Memory: Cognition and memory normal.        Judgment: Judgment normal.     Comments: Insight intact Mania resolved  and not depressed     Lab Review:     Component Value Date/Time   NA 135 08/22/2019 0752   NA 137 04/15/2014 1554   K 4.9 08/22/2019 0752   CL 102 08/22/2019 0752   CO2 25 08/22/2019 0752   GLUCOSE 156 (H) 08/22/2019 0752   BUN 14 08/22/2019 0752   BUN 15 04/15/2014 1554   CREATININE 0.85 08/22/2019 0752   CALCIUM 9.7 08/22/2019 0752   PROT 6.2 (L) 10/22/2016 1009   PROT 6.9 12/29/2015 1400   ALBUMIN 3.3 (L) 10/22/2016 1009   ALBUMIN 4.5 12/29/2015 1400   AST 26 10/22/2016 1009   ALT 25 10/22/2016 1009   ALKPHOS 81 10/22/2016 1009   BILITOT 1.0 10/22/2016 1009   BILITOT 0.6 12/29/2015 1400   GFRNONAA >60 12/25/2016 1254   GFRAA >60 12/25/2016 1254       Component Value Date/Time   WBC 8.1 12/25/2016 1254   RBC 4.34 12/25/2016 1254   HGB 12.0 12/25/2016 1254   HGB 11.9 06/30/2016 1122   HCT 35.8 (L) 12/25/2016 1254   HCT 36.2 06/30/2016  1122   PLT 255 12/25/2016 1254   PLT 237 06/30/2016 1122   MCV 82.5 12/25/2016 1254   MCV 81 06/30/2016 1122   MCH 27.6 12/25/2016 1254   MCHC 33.5 12/25/2016 1254   RDW 14.5 12/25/2016 1254   RDW 14.6 06/30/2016 1122   LYMPHSABS 1.6 10/22/2016 1009   LYMPHSABS 3.6 (H) 06/30/2016 1122   MONOABS 0.3 10/22/2016 1009   EOSABS 0.1 10/22/2016 1009   EOSABS 0.1 06/30/2016 1122   BASOSABS 0.0 10/22/2016 1009   BASOSABS 0.0 06/30/2016 1122    Anita Black Lvl  Date Value Ref Range Status  07/24/2021 1.4 (H) 0.6 - 1.2 mmol/L Final  08/13/20 Anita Black 1200= level 1.2 No results found for: "PHENYTOIN", "PHENOBARB", "VALPROATE", "CBMZ"   .res Assessment: Plan:     Anita Black was seen today for follow-up, manic behavior, sleeping problem and anxiety.  Diagnoses and all orders for this visit:  Bipolar I disorder, most recent episode mixed ([place]) -     Cariprazine HCl (VRAYLAR) 6 MG CAPS; Take 1 capsule (6 mg total) by mouth daily.  Panic disorder with agoraphobia -     ALPRAZolam (XANAX) 0.5 MG tablet; 1 at night and 1 tablet daily as needed for anxiety  Attention deficit hyperactivity disorder (ADHD), combined type  Generalized anxiety disorder  Insomnia due to mental condition  Anita Black use    Greater than 50% of 30-minute face to face time with patient was spent on counseling and coordination of care. We discussed her chronically unstable bipolar disorder with a history of abrupt worsening of depression with suicidal ideation.  Most recently she has been manic. Has tended to be sensitive and have difficulty with various medications and hence has taken multiple medications as noted.   Overall mood sx were stable on Anita Black 120 mg daily but she complained of rhinorrhea which was persistent and the dose was gradually reduced to 60 mg daily at which point she began to have rapid mixed cycling.  She does not want to go back up in the [place] and wants to try something else.  She has not been on  Vraylar before.  We discussed the side effects in detail.  Mania resolved on Vraylar 6 mg daily.  Based on her history she may have to go higher than that but we need to be cautious because of her history of intolerance of various medications.  Of note, the hx irritability is not due to [name] because she switched to Ritalin and the irritability was unchanged. [name] being taken from neuro for MS fatigue.  MS stable.  She agrees to continue the Anita Black 1200 mg daily.  Anita Black level slightly elevated at 1.4.  Previous level 1.2.  TSH slightly elevated at 5.48 with previous level 5.1.   Diarrhea resolved with colestipol.  She stopped this bc of other reasons but tolerating the Anita Black. Counseled patient regarding potential benefits, risks, and side effects of Anita Black to include potential risk of Anita Black affecting thyroid and renal function.  Discussed need for periodic lab monitoring to determine drug level and to assess for potential adverse effects.  Counseled patient regarding signs and symptoms of Anita Black toxicity and advised that they notify office immediately or seek urgent medical attention if experiencing these signs and symptoms.  Patient advised to contact office with any questions or concerns. Repeat Anita Black level and TSH bc borderline on biotin and now normal.  Xanax 0.5-1 mg prn anxiety or sleep.  Propranolol 20-40 mg BID prn restlessness and anxiety  Her insomnia is managed fairly well.  Follow-up 2-3 mos  Lynder Parents MD, DFAPA   Please see After Visit Summary for patient specific instructions.  Future Appointments  Date Time Provider [place]  08/10/2022 10:30 AM Cottle, Billey Co., MD [facility] None      No orders of the defined types were placed in this encounter.      -------------------------------

## 2022-08-10 ENCOUNTER — Ambulatory Visit (INDEPENDENT_AMBULATORY_CARE_PROVIDER_SITE_OTHER): Payer: Self-pay | Admitting: Psychiatry

## 2022-08-10 DIAGNOSIS — Z91199 Patient's noncompliance with other medical treatment and regimen due to unspecified reason: Secondary | ICD-10-CM

## 2022-08-10 NOTE — Progress Notes (Signed)
No show

## 2022-08-10 NOTE — Progress Notes (Signed)
Patient referred by Orpah Melter, MD for dyspnea on exertion  Subjective:   Anita Black, female    DOB: 01-09-1981, 42 y.o.   MRN: [id number]  *** No chief complaint on file.   *** HPI  42 y.o. Caucasian female with type 2 diabetes mellitus, hyperlipidemia, multiple sclerosis, anxiety, depression, referred for evaluation of dyspnea on exertion  *** Past Medical History:  Diagnosis Date   Anxiety    Depression    Diabetes type 2, controlled ([facility])    Elevated hemoglobin A1c    Hyperlipidemia    Migraine    Multiple sclerosis ([facility])    receives Tysabri infusion q 28 days    *** Past Surgical History:  Procedure Laterality Date   ABDOMINOPLASTY     CESAREAN SECTION     CHOLECYSTECTOMY  2001    *** Social History   Tobacco Use  Smoking Status Never  Smokeless Tobacco Never    Social History   Substance and Sexual Activity  Alcohol Use Yes   Comment: socially; once a week    *** Family History  Problem Relation Age of Onset   Diabetes Mother    Hypertension Mother    Dementia Mother    Bipolar disorder Mother    Heart failure Father    Hypertension Father    Heart disease Father    Ovarian cancer Maternal Grandmother    Alzheimer's disease Paternal Grandmother    Skin cancer Maternal Grandfather     ***  Current Outpatient Medications:    ALPRAZolam (XANAX) 0.5 MG tablet, 1 at night and 1 tablet daily as needed for anxiety, Disp: 45 tablet, Rfl: 2   atorvastatin (LIPITOR) 20 MG tablet, Take 20 mg by mouth daily., Disp: , Rfl:    Cariprazine HCl (VRAYLAR) 6 MG CAPS, Take 1 capsule (6 mg total) by mouth daily., Disp: 90 capsule, Rfl: 0   Cholecalciferol (VITAMIN D3) 10000 units TABS, Take by mouth., Disp: , Rfl:    colestipol (COLESTID) 1 g tablet, Take 2 g by mouth daily., Disp: , Rfl:    lisdexamfetamine ([name]) 60 MG capsule, Take 1 capsule (60 mg total) by mouth every morning., Disp: 30 capsule, Rfl: 0   lithium carbonate 300 MG  capsule, TAKE 4 CAPSULES BY MOUTH AT BEDTIME, Disp: 120 capsule, Rfl: 0   ocrelizumab (OCREVUS) 300 MG/10ML injection, Inject into the vein., Disp: , Rfl:    propranolol (INDERAL) 20 MG tablet, TAKE 1-2 TABLETS TWICE DAILY AS NEEDED FOR RESTLESSNESS OR ANXIETY, Disp: 360 tablet, Rfl: 0   rizatriptan (MAXALT-MLT) 10 MG disintegrating tablet, Take 1 tablet (10 mg total) by mouth as needed for migraine. May repeat in 2 hours if needed, Disp: 9 tablet, Rfl: 11   SPRINTEC 28 0.25-35 MG-MCG tablet, , Disp: , Rfl:    valACYclovir (VALTREX) 1000 MG tablet, 2000 mg every 12 hours for 2 doses., Disp: 8 tablet, Rfl: 3   Cardiovascular and other pertinent studies:  Reviewed external labs and tests, independently interpreted  *** EKG ***/***/202***: ***  ***  *** Recent labs: 07/30/2022: Glucose 162, BUN/Cr 21/0.77. EGFR 99. Na/K 136/4.3. Rest of the CMP normal H/H 13.2/40.2. MCV 86.2. Platelets 376 HbA1C 8.0%  04/12/2022: Chol 194, TG 95, HDL 95, LDL 83 ***TSH ***normal   *** ROS      *** There were no vitals filed for this visit.   There is no height or weight on file to calculate BMI. There were no vitals filed for this visit.  ***  Objective:   Physical Exam    ***     Visit diagnoses: No diagnosis found.   No orders of the defined types were placed in this encounter.    Medication changes this visit: There are no discontinued medications.  No orders of the defined types were placed in this encounter.    Assessment & Recommendations:   ***  ***  Thank you for referring the patient to [place]. Please feel free to contact with any questions.   Nigel Mormon, MD Pager: [phone] Office: [phone]

## 2022-08-11 ENCOUNTER — Ambulatory Visit: Payer: Managed Care, Other (non HMO) | Admitting: Cardiology

## 2022-08-11 DIAGNOSIS — R0609 Other forms of dyspnea: Secondary | ICD-10-CM | POA: Insufficient documentation

## 2022-08-13 ENCOUNTER — Other Ambulatory Visit: Payer: Self-pay | Admitting: Psychiatry

## 2022-08-13 DIAGNOSIS — F3112 Bipolar disorder, current episode manic without psychotic features, moderate: Secondary | ICD-10-CM

## 2022-08-31 ENCOUNTER — Other Ambulatory Visit: Payer: Self-pay | Admitting: Family

## 2022-08-31 DIAGNOSIS — N6323 Unspecified lump in the left breast, lower outer quadrant: Secondary | ICD-10-CM

## 2022-09-01 ENCOUNTER — Ambulatory Visit
Admission: RE | Admit: 2022-09-01 | Discharge: 2022-09-01 | Disposition: A | Discharge status: Home / Self Care | Payer: Self-pay | Source: Ambulatory Visit | Attending: Family | Admitting: Family

## 2022-09-01 ENCOUNTER — Ambulatory Visit
Admission: RE | Admit: 2022-09-01 | Discharge: 2022-09-01 | Disposition: A | Discharge status: Home / Self Care | Payer: Managed Care, Other (non HMO) | Source: Ambulatory Visit | Attending: Family | Admitting: Family

## 2022-09-01 DIAGNOSIS — N6323 Unspecified lump in the left breast, lower outer quadrant: Secondary | ICD-10-CM

## 2022-09-13 ENCOUNTER — Other Ambulatory Visit: Payer: Self-pay

## 2022-09-29 ENCOUNTER — Ambulatory Visit (INDEPENDENT_AMBULATORY_CARE_PROVIDER_SITE_OTHER): Payer: 59 | Admitting: Psychiatry

## 2022-09-29 ENCOUNTER — Encounter: Payer: Self-pay | Admitting: Psychiatry

## 2022-09-29 DIAGNOSIS — F902 Attention-deficit hyperactivity disorder, combined type: Secondary | ICD-10-CM

## 2022-09-29 DIAGNOSIS — F3112 Bipolar disorder, current episode manic without psychotic features, moderate: Secondary | ICD-10-CM

## 2022-09-29 DIAGNOSIS — F5105 Insomnia due to other mental disorder: Secondary | ICD-10-CM

## 2022-09-29 DIAGNOSIS — F4001 Agoraphobia with panic disorder: Secondary | ICD-10-CM

## 2022-09-29 DIAGNOSIS — F411 Generalized anxiety disorder: Secondary | ICD-10-CM

## 2022-09-29 DIAGNOSIS — F3160 Bipolar disorder, current episode mixed, unspecified: Secondary | ICD-10-CM

## 2022-09-29 DIAGNOSIS — Z79899 Other long term (current) drug therapy: Secondary | ICD-10-CM

## 2022-09-29 MED ORDER — LITHIUM CARBONATE 300 MG PO CAPS: ORAL_CAPSULE | ORAL | 0 refills | Status: DC

## 2022-09-29 MED ORDER — ALPRAZOLAM 0.5 MG PO TABS: ORAL_TABLET | ORAL | 3 refills | Status: DC

## 2022-09-29 MED ORDER — VRAYLAR 6 MG PO CAPS
1.0000 | ORAL_CAPSULE | Freq: Every day | ORAL | 0 refills | Status: DC
Start: 2022-09-29 — End: 2022-12-30

## 2022-09-29 NOTE — Progress Notes (Signed)
Anita Black [id number] 1981-04-13 42 y.o.   Subjective:   Patient ID:  Anita Black is a 42 y.o. (DOB 30-Mar-1981) female.  Chief Complaint:  Chief Complaint  Patient presents with   Follow-up   Depression   Anxiety   Manic Behavior    Anxiety Symptoms include nervous/anxious behavior. Patient reports no confusion, decreased concentration, nausea or suicidal ideas.    Depression        Associated symptoms include fatigue.  Associated symptoms include no decreased concentration and no suicidal ideas.  Past medical history includes anxiety.     Anita Black presents to the office today for follow-up of chronically unstable bipolar disorder and anxiety.  In March 26 , 2020.  She had been switched to [name] and had a good response.   When seen November 29, 2018 which she had reduced [name] on her own to 500 mg daily because of hair loss.  She was cautioned that this would probably not be enough medication to maintain mood stability but she did not want to make any medicine changes or increase the dose back up she was encouraged to take vitamins which often help with [name] related hair loss eventually.  visit December 12, 2018.  She had stopped [name] on her own due to hair loss and refused to consider taking vitamins to counteract this hair loss despite the fact that the [name] had been effective and she has had a very treatment resistant course with multiple med failures.  She asked about taking [name].  [name] was started at 5 mg a day for 1 week to increase then to 10 mg daily as a mood stabilizer.  She went up quicker.    It's been good.  Less irritable.  No SE.  Not manic.  Not much depression.  No change in chronic anxiety.  Sleep is the same and is OK.  Pretty level.  No worsenging mood sx with switch to [name] 10.  She's aware it is a low bipolar dose but satisfied.    seen January 10, 2019.  She had seen improvement with [name] 10 mg and so no meds were  changed. seen May 07, 2019.  She had stopped [name] 3-4 weeks later dT stomach pain which resolved off the meds.  Still taking [name] 120 daily, lamtrogine 200, lithium 900, [name] 60 daily.  We increased lamotrigine to 300 mg daily to try to help with ongoing mood instability.  07/04/19 appt with the followiing noted: No difference with increased lamotrigine to 300 mg.  Emotional outbursts easily triggered.  I got to do something about the outbursts.   Overall ok with a little moodiness.  Asks about increasing lamotrigine back to 200 BID.   Within last week hair loss stopped.   Anxiety still terrible but I still don't want to add anything else bc I'm on so much. Mostly worry vs physical sx. Ex convinced herself that F would get Covid and had to prepare for it.  No pattern to worry thoughts. No obsessions.  No panic attacks.  Sleep is okay.  Not profoundly depressed.  No hx drug abuse or alcohol abuse..    She reduced the lithium to 900 mg daily bc GI SE.  Somethings causing nausea again.  Higher dosage of lithium with less diarrhea manageable for right now.   Sleep OK. No manic SX.  Didn't realize how unstable she was until feeling stable. Plan: Reduce lamotrigine Back to 200 mg daily.   She agrees  to increase the lithium back to 1200 mg daily.  Wait 1 week then get lithium level BMP and TSH   07/10/19 TC with the following noted:  CO having to wean off [name] again DT cost. MD response:We have had to do this before and historically weaning her off [name] has led to more depression.  I would encourage her to investigate exactly when she meets deductible to ensure that that she wants to take this risk of worsening depression and mood swings. If she feels it necessary to wean off the [name] then cut the dosage in half to 60 mg for 2 weeks and then stop it.  If she has the opportunity to do 60 mg for 2 weeks and then 12:45 120 mg tablet for 2 additional weeks that would be ideal. If her symptoms  worsen we could consider loxapine now that it is available again but it is typically not as effective for depression as is [name].  01/08/20 appt with the following noted: Stopped [name] after the samples.  BC high ded policy   Has been fine off the [name].  Patient reports stable mood and denies depressed moods.  Brief periods of sadness over difficulties with MS.  MS is fairly stable. Still some irritability but usually manageable.  Goes away on her own if feels she'll explode which she does at least 4 times weekly.  Same as on [name].  Patient with chronic manageable anxiety.  Patient denies difficulty with sleep initiation or maintenance. Denies appetite disturbance.  Patient reports that energy and motivation have been good.  Patient denies any difficulty with concentration.  Patient denies any suicidal ideation. Tolerating lithium without GI problems she had in the past. Plan: no med changes. She agrees to continue the lithium back to 1200 mg daily.  Labs good as noted lithium 0.8 on 1200.  04/02/2020 patient called for an urgent appointment today: She called on 03/20/2020 asking for something for anxiety.  Because she was on stimulant we gave her hydroxyzine 10 to 20 mg 3 times daily as needed. Says she doubted bipolar dx and now believes it. "I'm so manic".  Can't stop it.  I'm so mean and I can't stop it.  Tense inside and racing inside. Hydroxyzine didn't help. Recognized mania last week.  Felt high and euphoric.  Not crazy thoughts but impulsive and increased spending of $200 in arc[place] yesterday.  Is sleeping bc is exhausted.  Chronic MS fatigue.  Hyperactive.  More research online.  Hypomanic is her self dx.  Increased grandiosity of seeing things more clearly.  Impulsive and irritable alternating with euphoria. Can't stop herself. Been consistent with lithium.  Ran out of lamotrigine several weeks ago and forgot to pick it up.  Close friend noticed the changes and saying she was talking  fast. H doesn't believe in mental health problems. Plan: Disc dx mania which she had previously denied but now clearly recognizes.  She has a history of taking [name] with benefit but stopped it due to cost.  She believes her insurance will cover it now because she has a different policy.  We will start with 60 mg but increase to 120 mg very quickly because she is likely to need the higher dose to manage the mania. She agrees to continue the lithium back to 1200 mg daily.  Labs good as noted lithium 0.8 on 1200. For mania, start [name] 120 mg which she's taken before. Klonopin 1 mg TID until mania resolves  04/11/2020 appointment  with the following noted: Seen urgently to follow-up recent mania.  Seen as a work in appointment. I feel much better. Mania more controlled but still feels it.  Arm had been numb from anxiety and it's better but not gone.  Not racing as much.  Mind is clearer.  Still a little distractible but less.  Still feels a little too upbeat. Extreme diarrhea and lost 8# in the week.  Only change was [name].  The first 4-5 nights was sedated but not last night. Doesn't like clonazepam bc very drowsy with it and only takes it when arm goes numb.  Taking only 1 every 2 days. She agrees to continue the lithium back to 1200 mg daily.  Labs good as noted lithium 0.8 on 1200. For mania, continue  [name] 120 mg which she's taken before.  It's helping. Start immodium prn  For diarrhea. Expect diarrhea will resolve as patient gets more used to the [name].  She started right out at a high dose which is unusual. Klonopin 1 mg TID prn  until mania resolves.  Good that she does not use much bc of [name].   04/16/20 TC's. Pt called and said that she is in the middle of a maniac state. She said that she is taking latuda and klonopin. However the klonopin is making her heart race and she is unable to rest. She has tried cutting it in half and still the same. She would like something else called  into her pharmacy so she can rest She recently switched back to [name] 120 mg which is so far not managing the mania.  She continues on lithium. The clonazepam is not making her heart race.  That is caused by the mania.  However I know she does not like the clonazepam so I will switch it to lorazepam 1 mg every 6 hours as needed agitation or insomnia. To better control the mania we will temporarily add haloperidol 5 mg nightly.  She is always concerned about weight gain and haloperidol does not have a significant risk of headache weight gain and is a good antimanic med.  Once the mania is controlled for a week or 2 then she can stop the haloperidol.   07/02/20 appt noted: Took Haldol 5 mg for a week and it calmed down and stopped it. It caused drowsiness. Manic sx resolved but still real irritable.  Insurance covering [place] so far.  Threw clonazepam away bc didn't seem to help.  Took lorazepam 1 mg and it seemed to have better calming effect.  Irritability in sudden bursts of 30 min and then resolves.  Cannot control it well.  Kind of weepy too.   EFA.  Sleep 7 hours.  Good function. On lithium 1200, [name] 120, [name] 60. Off lamotrigine since October.  GI rx colestipol and diarrhea stopped.  Took Ritalin 10 days without [name] and irritability without change. Tolerating medications  Plan: Add Luvox 50 mg HS for anxiety and irritability.  09/17/20 appt noted: Fluvoxamine for 3 weeks caused worsening runny nose and stopped.  Irritability got better and hasn't returned.  Mood has been good.  Anxiety is fine with nothing off the charts.  Sleep is good. SE only runny nose.  Diarrhea managed with colestipol. Plan: She agrees to continue the lithium back to 1200 mg daily.  08/13/20 lithium 1200= level 1.2 Diarrhea resolved with colestipol. Continue [name] 120  01/19/2021 appointment with the following noted: Doing good.  Pretty stable since here.   CO  weight gain eating at night to take it.  Too  sleepy if takes with dinner. Not had Covid. CO lorazepam 1 mg prn anxiety not being effective.  Using it 3 times weekly. Plan: OK change BZ to infrequent use of Xanax 0.5-1 mg prn anxiety about 3 times per week. DC lorazepam BC ineffective.  05/19/2021 appt noted: Wants to change [name] bc nose running and no sex drive. Mood good without mania nor depression. No increased SE lithium and no tremors.   Not irritability nor agitation. Patient reports stable mood and denies depressed or irritable moods.  Patient denies any recent difficulty with anxiety.  Patient denies difficulty with sleep initiation or maintenance. Denies appetite disturbance.  Patient reports that energy and motivation have been good.  Patient denies any difficulty with concentration.  Patient denies any suicidal ideation. Plan: CO runny nose and SE [name] 120 mg daily.  Did ok without it briefly in past  Trial reduction [name] to 60 mg daily. If starts having mood sx start Vrayalar 1.5 mg and increase to 3 mg daily.  07/22/2021 appointment with the following noted: Less runny nose with less [name] at 60 mg daily and feels her personality.   Asks about reducing lithium. No diarrhea on Monjoru but switched to low dose Ozempic. Mood stable .  Patient reports stable mood and denies depressed or irritable moods.  Patient denies any recent difficulty with anxiety.  Patient denies difficulty with sleep initiation or maintenance. Denies appetite disturbance.  Patient reports that energy and motivation have been good.  Patient denies any difficulty with concentration.  Patient denies any suicidal ideation. Plan: CO runny nose and SE [name] 120 mg daily.  Did ok without it briefly in past  Ok with reduction [name] to 60 mg daily. If starts having mood sx start Vrayalar 1.5 mg and increase to 3 mg daily.  08/28/2021 phone call with mixed manic symptoms.  With some thoughts of death yes thank you with no suicidal plan. MD response: The  most reliably effective strategy would be to increase the [name] back to 80 or more milligrams because we know that has worked before.  However she has complained of a runny nose and wanted the dose reduced.  Obviously the lower dose of [name] is not adequate.  As we discussed at the last appointment we can try Vraylar but there is no guarantee that it will work.  If she wants to try something unproven in order to try to avoid the runny nose side effect of [name], then she can pick up some samples of Vraylar 1.5 mg each morning for 1 week and then increase to 3 mg daily and continue the current dose of [name] for now.  09/07/21 appt noted:   Did not start Vraylar yet bc had sx. Calmed some now.  Was cycling from fine to cloudy and confused with racing thoughts.  Somewhat exhausted from the hypomania to normal ultra rapid cycling. Asked about dosing andn SE Vraylar and wanted to know before starting it. Burning bridges left and right last week. Plan: We will wean [name] over 10 days or so and start Vraylar 1.5 mg for 1 week and then increase to 3 mg daily.  09/28/2021 appointment with the following noted: LOving the Vraylar.  Clearer mind. Mood seems stable and out of the swing she was in. Irritability and depression resolved. SE none so far. Runny nose lessened but didn't resolve. Needs Xanax to go to sleep bc anxious thoughts at night.  Then awakens 3 and restless thereafter. Goes to bed 930 and enough sleep. Energy chronically low with MS. Better interests with Vraylar bc dulled with [name] Plan: Trial clonidine off label for anxiety and agitation 0.1 mg  Continue Vraylar 3 mg daily New alprazolam 0.5 mg 3 times daily as needed anxiety, Continue [name] 60 mg daily for MS fatigue Continue lithium 1200 mg daily  10/27/2021 appointment with the following noted: Still loves the Vraylar and is much better with it.  Irritability is so much better H notices. Still some anxiety but not extreme,  usually. Last week had episode of driving anxiety and had to pull over but it's not always that bad. Takes Xanax in evening and not day bc gets groggy with it. Overall feels the anxiety is manageable though it is worse in the evening and she has to take Xanax in the evening.  However because the mood symptoms are so much more stable she feels like the anxiety is more manageable as well. SE mild restlessness You should put everybody on Vraylar.  More of her personality. Plan: no med changes  03/17/22 appt noted: I still think it is a miracle drug.  Some weeks thinks she should go up a dose bc can tell mild cycling but doesn't feel bad.  I can just tell.  But could n't tolerate morte restlessness which is persistent about the same but some days not noticeable but usually only 2-3 hours in the AM and then fine.  Much less irritable with Vraylar. Otherwise SE ok. Thinks may need something more for anxiety.  L arm goes numb with anxiety and did so in her 32s with anxiety and it makes her real scared.  Happens daily in the evening. Takes Xanax in PM when it happens and it lessens the sx. But needs to take 1 mg Xanax to help this. Would like something more to prevent this if possible. Plan: no med changes  06/10/22 TC: Patient said she is manic. She is only getting 1-2 hours of sleep a night, unable to get to sleep or stay asleep. Feels like she is losing her mind, risky behaviors, acting out of character, feels like she is crawling out of her skin.  Denies irritability, yelling. Denies depression, "happy as a clam." Anxiety is 8/10. No SI.  Meds:  Alprazolam - 1-2 TID, needs that much currently, doesn't need it daily  Propranolol - stopped it because she didn't feel it was helpful  Lithium 300 mg, 4 capsules at bedtime  Vraylar 3 mg qd - said it was a miracle drug until it wasn't.  Is requesting something for sleep in addition to needing adjustment of meds.  MD response:  For mania and increase the  Vraylar to 6 mg doubling up on the current 3 mg capsules and call us back next week and let us know how she is doing.  For sleep there is not a lot that I can give her that is compatible with the Xanax.  She can take Xanax up to 2 mg at night if it will work. Make sure she is taking 4 capsules of lithium every day and please get the lithium level as soon as possible.     07/22/22 appt noted: Taking Xanax 0.5 mg HS, Vraylar 6, lithium 1200 mg .  No propranolol bc didn't do anything.  I think I'm normal .  Is sleeping again 9-6..  With mania was worst thing Had compulsive gambling $40K online and didn't care.  Never did it before.  Wasn't eating while manic and then started eating more.   Seems like a blur.   Also spending too much on things she didn't need.  Didn't realize she was manic until 2 weeks in.  Couldn't sleep.  When increased Vraylar gradually better for 3 weeks to baseline.  Best friend said she thought something was off.  Only lately told H she was bipolar.   F has dementia and multiple myeloma and living with them at 42 yo.   No SE.   Mood is still good without depression and mania.   Having a lot of anxiety but not panic.  Worry over wrecks or people dying.  Last few years anxiety over driving or being late.   Plan: no changes  09/29/22 appt noted: Less irritable and very good since increasing the Vraylar. No sig mania.  Depression under control. F passed away with normal grief.  No SE Meds as above.   Sleep good with Xanax.  Some awakening. Health is ok.  Started Ozempic which is constipating.   past psych meds:  [name] 1500 good resp but hair loss,, lithium 1200, CBZ, Equetro helped but GI side effects, Trileptal,  lamotrigine, [name] retrial 10 GI, Rexulti, Vraylar  [name] 120 helped but SE runny nose and low libido Seroquel low dosages with weight gain, Haldol 5 drowsy  Zoloft 150, Trintellix, , mirtazapine, Lexapro, Duloxetine side effects, long history of  sertraline, Lexapro headaches, Trintellix,  doxazosin.   trazodone, mirtazapine,   Ambien too sedated Clonazepam less effective than lorazepam not as good as Xanax Remote high dose Xanax 2 mg.  Clonidine 0.1 mg NR and no SE  Adderall for MS fatigue, [name] for MS fatigue, armodafinil NR, Ritalin NR 20 BID without [name]  Review of Systems:  Review of Systems  Constitutional:  Positive for fatigue.  HENT:         Runny nose  Gastrointestinal:  Negative for diarrhea, nausea and vomiting.  Skin:        Hair loss  Neurological:  Negative for tremors.       Balance problems  Psychiatric/Behavioral:  Negative for agitation, behavioral problems, confusion, decreased concentration, dysphoric mood, hallucinations, self-injury, sleep disturbance and suicidal ideas. The patient is nervous/anxious. The patient is not hyperactive.   Depression resolved  Medications: I have reviewed the patient's current medications.  Current Outpatient Medications  Medication Sig Dispense Refill   atorvastatin (LIPITOR) 20 MG tablet Take 20 mg by mouth daily.     Cholecalciferol (VITAMIN D3) 10000 units TABS Take by mouth.     colestipol (COLESTID) 1 g tablet Take 2 g by mouth daily.     lisdexamfetamine ([name]) 60 MG capsule Take 1 capsule (60 mg total) by mouth every morning. 30 capsule 0   ocrelizumab (OCREVUS) 300 MG/10ML injection Inject into the vein.     rizatriptan (MAXALT-MLT) 10 MG disintegrating tablet Take 1 tablet (10 mg total) by mouth as needed for migraine. May repeat in 2 hours if needed 9 tablet 11   SPRINTEC 28 0.25-35 MG-MCG tablet      valACYclovir (VALTREX) 1000 MG tablet 2000 mg every 12 hours for 2 doses. 8 tablet 3   ALPRAZolam (XANAX) 0.5 MG tablet 1 at night and 1 tablet daily as needed for anxiety 45 tablet 3   Cariprazine HCl (VRAYLAR) 6 MG CAPS Take 1 capsule (6 mg total) by mouth daily. 90 capsule 0   lithium carbonate 300 MG capsule TAKE 4 CAPSULES  BY MOUTH AT BEDTIME 360  capsule 0   propranolol (INDERAL) 20 MG tablet TAKE 1-2 TABLETS TWICE DAILY AS NEEDED FOR RESTLESSNESS OR ANXIETY (Patient not taking: Reported on 09/29/2022) 360 tablet 0   No current facility-administered medications for this visit.    Medication Side Effects: .diarrhea resolved.  [name] runny nose improved with dose reduction  Allergies: No Known Allergies  Past Medical History:  Diagnosis Date   Anxiety    Depression    Diabetes type 2, controlled (HCC)    Elevated hemoglobin A1c    Hyperlipidemia    Migraine    Multiple sclerosis (HCC)    receives Tysabri infusion q 28 days    Family History  Problem Relation Age of Onset   Diabetes Mother    Hypertension Mother    Dementia Mother    Bipolar disorder Mother    Heart failure Father    Hypertension Father    Heart disease Father    Ovarian cancer Maternal Grandmother    Alzheimer's disease Paternal Grandmother    Skin cancer Maternal Grandfather     Social History   Socioeconomic History   Marital status: Married    Spouse name: Rollen Sox   Number of children: 1   Years of education: MA   Highest education level: Not on file  Occupational History    Employer: [employer]  Tobacco Use   Smoking status: Never   Smokeless tobacco: Never  Vaping Use   Vaping Use: Never used  Substance and Sexual Activity   Alcohol use: Yes    Comment: socially; once a week   Drug use: No   Sexual activity: Yes    Birth control/protection: Pill  Other Topics Concern   Not on file  Social History Narrative   Patient lives at home with family.   Caffeine Use: 1-2 cups daily   No tobacco, recreational drugs. Social drinking only.   Wears her seatbelt, has smoke alarm in the home.   Feels safe in her relationships.   Social Determinants of Health   Financial Resource Strain: Not on file  Food Insecurity: Not on file  Transportation Needs: Not on file  Physical Activity: Not on file  Stress: Not on file  Social  Connections: Not on file  Intimate Partner Violence: Not on file    Past Medical History, Surgical history, Social history, and Family history were reviewed and updated as appropriate.   Please see review of systems for further details on the patient's review from today.   Objective:   Physical Exam:  There were no vitals taken for this visit.  Physical Exam Constitutional:      General: She is not in acute distress. Musculoskeletal:        General: No deformity.  Neurological:     Mental Status: She is alert and oriented to person, place, and time.     Cranial Nerves: No dysarthria.     Coordination: Coordination normal.  Psychiatric:        Attention and Perception: Attention and perception normal. She does not perceive auditory or visual hallucinations.        Mood and Affect: Mood is anxious. Mood is not depressed. Affect is not angry or tearful.        Speech: Speech is not rapid and pressured.        Behavior: Behavior is not agitated, aggressive or hyperactive. Behavior is cooperative.        Thought Content: Thought content normal. Thought  content is not paranoid or delusional. Thought content does not include homicidal or suicidal ideation. Thought content does not include suicidal plan.        Cognition and Memory: Cognition and memory normal.        Judgment: Judgment normal.     Comments: Insight intact Mania resolved and not depressed     Lab Review:     Component Value Date/Time   NA 135 08/22/2019 0752   NA 137 04/15/2014 1554   K 4.9 08/22/2019 0752   CL 102 08/22/2019 0752   CO2 25 08/22/2019 0752   GLUCOSE 156 (H) 08/22/2019 0752   BUN 14 08/22/2019 0752   BUN 15 04/15/2014 1554   CREATININE 0.85 08/22/2019 0752   CALCIUM 9.7 08/22/2019 0752   PROT 6.2 (L) 10/22/2016 1009   PROT 6.9 12/29/2015 1400   ALBUMIN 3.3 (L) 10/22/2016 1009   ALBUMIN 4.5 12/29/2015 1400   AST 26 10/22/2016 1009   ALT 25 10/22/2016 1009   ALKPHOS 81 10/22/2016 1009    BILITOT 1.0 10/22/2016 1009   BILITOT 0.6 12/29/2015 1400   GFRNONAA >60 12/25/2016 1254   GFRAA >60 12/25/2016 1254       Component Value Date/Time   WBC 8.1 12/25/2016 1254   RBC 4.34 12/25/2016 1254   HGB 12.0 12/25/2016 1254   HGB 11.9 06/30/2016 1122   HCT 35.8 (L) 12/25/2016 1254   HCT 36.2 06/30/2016 1122   PLT 255 12/25/2016 1254   PLT 237 06/30/2016 1122   MCV 82.5 12/25/2016 1254   MCV 81 06/30/2016 1122   MCH 27.6 12/25/2016 1254   MCHC 33.5 12/25/2016 1254   RDW 14.5 12/25/2016 1254   RDW 14.6 06/30/2016 1122   LYMPHSABS 1.6 10/22/2016 1009   LYMPHSABS 3.6 (H) 06/30/2016 1122   MONOABS 0.3 10/22/2016 1009   EOSABS 0.1 10/22/2016 1009   EOSABS 0.1 06/30/2016 1122   BASOSABS 0.0 10/22/2016 1009   BASOSABS 0.0 06/30/2016 1122    Lithium Lvl  Date Value Ref Range Status  07/24/2021 1.4 (H) 0.6 - 1.2 mmol/L Final  08/13/20 lithium 1200= level 1.2 No results found for: "PHENYTOIN", "PHENOBARB", "VALPROATE", "CBMZ"   .res Assessment: Plan:     Doreather was seen today for follow-up, depression, anxiety and manic behavior.  Diagnoses and all orders for this visit:  Bipolar I disorder, most recent episode mixed (HCC) -     Lithium level -     Basic metabolic panel -     TSH -     Cariprazine HCl (VRAYLAR) 6 MG CAPS; Take 1 capsule (6 mg total) by mouth daily.  Panic disorder with agoraphobia -     ALPRAZolam (XANAX) 0.5 MG tablet; 1 at night and 1 tablet daily as needed for anxiety  Attention deficit hyperactivity disorder (ADHD), combined type  Generalized anxiety disorder  Insomnia due to mental condition  Lithium use -     Lithium level -     Basic metabolic panel -     TSH  Bipolar 1 disorder, manic, moderate (HCC) -     lithium carbonate 300 MG capsule; TAKE 4 CAPSULES BY MOUTH AT BEDTIME    Greater than 50% of 30-minute face to face time with patient was spent on counseling and coordination of care. We discussed her chronically unstable bipolar  disorder with a history of abrupt worsening of depression with suicidal ideation.  Most recently she has been manic. Has tended to be sensitive and have difficulty with various  medications and hence has taken multiple medications as noted.   Overall mood sx were stable on [name] 120 mg daily but she complained of rhinorrhea which was persistent and the dose was gradually reduced to 60 mg daily at which point she began to have rapid mixed cycling.  She does not want to go back up in the [place] and wants to try something else.  She has not been on Vraylar before.  We discussed the side effects in detail.  Mania resolved on Vraylar 6 mg daily.  Based on her history she may have to go higher than that but we need to be cautious because of her history of intolerance of various medications.  Of note, the hx irritability is not due to [name] because she switched to Ritalin and the irritability was unchanged. [name] being taken from neuro for MS fatigue.  MS stable.  She agrees to continue the lithium 1200 mg daily.  Needs labs.  Diarrhea resolved with colestipol.  She stopped this bc of other reasons but tolerating the lithium. Counseled patient regarding potential benefits, risks, and side effects of lithium to include potential risk of lithium affecting thyroid and renal function.  Discussed need for periodic lab monitoring to determine drug level and to assess for potential adverse effects.  Counseled patient regarding signs and symptoms of lithium toxicity and advised that they notify office immediately or seek urgent medical attention if experiencing these signs and symptoms.  Patient advised to contact office with any questions or concerns. Repeat lithium level and TSH bc borderline   Xanax 0.5-1 mg prn anxiety or sleep.  No med changes.  Her insomnia is managed fairly well.  Follow-up 3 mos  Meredith Staggers MD, DFAPA   Please see After Visit Summary for patient specific instructions.  No  future appointments.     Orders Placed This Encounter  Procedures   Lithium level   Basic metabolic panel   TSH       -------------------------------

## 2022-09-30 ENCOUNTER — Telehealth: Payer: Self-pay | Admitting: Psychiatry

## 2022-09-30 LAB — BASIC METABOLIC PANEL
BUN: 19 mg/dL (ref 7–25)
CO2: 21 mmol/L (ref 20–32)
Calcium: 9.8 mg/dL (ref 8.6–10.2)
Chloride: 105 mmol/L (ref 98–110)
Creat: 0.81 mg/dL (ref 0.50–0.99)
Glucose, Bld: 167 mg/dL — ABNORMAL HIGH (ref 65–139)
Potassium: 4.5 mmol/L (ref 3.5–5.3)
Sodium: 135 mmol/L (ref 135–146)

## 2022-09-30 LAB — TSH: TSH: 3.12 mIU/L

## 2022-09-30 LAB — LITHIUM LEVEL: Lithium Lvl: 0.8 mmol/L (ref 0.6–1.2)

## 2022-09-30 NOTE — Telephone Encounter (Signed)
CMM sent PA Request for Anita Black # [id number]

## 2022-10-16 NOTE — Telephone Encounter (Signed)
Vraylar 6 mg already approved March 2024. PA for Vraylar 3 mg initiated but pt not on that dose, possibly a renewal from last year.

## 2022-12-22 ENCOUNTER — Ambulatory Visit: Payer: Managed Care, Other (non HMO) | Admitting: Cardiology

## 2022-12-30 ENCOUNTER — Ambulatory Visit (INDEPENDENT_AMBULATORY_CARE_PROVIDER_SITE_OTHER): Payer: 59 | Admitting: Psychiatry

## 2022-12-30 ENCOUNTER — Encounter: Payer: Self-pay | Admitting: Psychiatry

## 2022-12-30 DIAGNOSIS — F411 Generalized anxiety disorder: Secondary | ICD-10-CM

## 2022-12-30 DIAGNOSIS — Z79899 Other long term (current) drug therapy: Secondary | ICD-10-CM

## 2022-12-30 DIAGNOSIS — F3112 Bipolar disorder, current episode manic without psychotic features, moderate: Secondary | ICD-10-CM

## 2022-12-30 DIAGNOSIS — F5105 Insomnia due to other mental disorder: Secondary | ICD-10-CM | POA: Diagnosis not present

## 2022-12-30 DIAGNOSIS — F4001 Agoraphobia with panic disorder: Secondary | ICD-10-CM | POA: Diagnosis not present

## 2022-12-30 DIAGNOSIS — F3160 Bipolar disorder, current episode mixed, unspecified: Secondary | ICD-10-CM | POA: Diagnosis not present

## 2022-12-30 MED ORDER — ALPRAZOLAM 0.5 MG PO TABS: ORAL_TABLET | ORAL | 3 refills | Status: DC

## 2022-12-30 MED ORDER — VRAYLAR 6 MG PO CAPS
1.0000 | ORAL_CAPSULE | Freq: Every day | ORAL | 0 refills | Status: DC
Start: 2022-12-30 — End: 2023-02-22

## 2022-12-30 MED ORDER — LITHIUM CARBONATE 300 MG PO CAPS: ORAL_CAPSULE | ORAL | 0 refills | Status: DC

## 2022-12-30 NOTE — Progress Notes (Signed)
Anita Black [id number] 10/08/1980 42 y.o.   Subjective:   Patient ID:  Anita Black is a 42 y.o. (DOB 11/28/1980) female.  Chief Complaint:  Chief Complaint  Patient presents with   Follow-up   Depression   Anxiety    Anxiety Symptoms include nervous/anxious behavior. Patient reports no confusion, decreased concentration, nausea or suicidal ideas.    Depression        Associated symptoms include fatigue.  Associated symptoms include no decreased concentration and no suicidal ideas.  Past medical history includes anxiety.     Anita Black presents to the office today for follow-up of chronically unstable bipolar disorder and anxiety.  In March 26 , 2020.  She had been switched to [name] and had a good response.   When seen November 29, 2018 which she had reduced [name] on her own to 500 mg daily because of hair loss.  She was cautioned that this would probably not be enough medication to maintain mood stability but she did not want to make any medicine changes or increase the dose back up she was encouraged to take vitamins which often help with [name] related hair loss eventually.  visit December 12, 2018.  She had stopped [name] on her own due to hair loss and refused to consider taking vitamins to counteract this hair loss despite the fact that the [name] had been effective and she has had a very treatment resistant course with multiple med failures.  She asked about taking [name].  [name] was started at 5 mg a day for 1 week to increase then to 10 mg daily as a mood stabilizer.  She went up quicker.    It's been good.  Less irritable.  No SE.  Not manic.  Not much depression.  No change in chronic anxiety.  Sleep is the same and is OK.  Pretty level.  No worsenging mood sx with switch to [name] 10.  She's aware it is a low bipolar dose but satisfied.    seen January 10, 2019.  She had seen improvement with [name] 10 mg and so no meds were changed. seen  May 07, 2019.  She had stopped [name] 3-4 weeks later dT stomach pain which resolved off the meds.  Still taking [name] 120 daily, lamtrogine 200, lithium 900, [name] 60 daily.  We increased lamotrigine to 300 mg daily to try to help with ongoing mood instability.  07/04/19 appt with the followiing noted: No difference with increased lamotrigine to 300 mg.  Emotional outbursts easily triggered.  I got to do something about the outbursts.   Overall ok with a little moodiness.  Asks about increasing lamotrigine back to 200 BID.   Within last week hair loss stopped.   Anxiety still terrible but I still don't want to add anything else bc I'm on so much. Mostly worry vs physical sx. Ex convinced herself that F would get Covid and had to prepare for it.  No pattern to worry thoughts. No obsessions.  No panic attacks.  Sleep is okay.  Not profoundly depressed.  No hx drug abuse or alcohol abuse..    She reduced the lithium to 900 mg daily bc GI SE.  Somethings causing nausea again.  Higher dosage of lithium with less diarrhea manageable for right now.   Sleep OK. No manic SX.  Didn't realize how unstable she was until feeling stable. Plan: Reduce lamotrigine Back to 200 mg daily.   She agrees to increase the lithium  back to 1200 mg daily.  Wait 1 week then get lithium level BMP and TSH   07/10/19 TC with the following noted:  CO having to wean off [name] again DT cost. MD response:We have had to do this before and historically weaning her off [name] has led to more depression.  I would encourage her to investigate exactly when she meets deductible to ensure that that she wants to take this risk of worsening depression and mood swings. If she feels it necessary to wean off the [name] then cut the dosage in half to 60 mg for 2 weeks and then stop it.  If she has the opportunity to do 60 mg for 2 weeks and then 12:45 120 mg tablet for 2 additional weeks that would be ideal. If her symptoms worsen we  could consider loxapine now that it is available again but it is typically not as effective for depression as is [name].  01/08/20 appt with the following noted: Stopped [name] after the samples.  BC high ded policy   Has been fine off the [name].  Patient reports stable mood and denies depressed moods.  Brief periods of sadness over difficulties with MS.  MS is fairly stable. Still some irritability but usually manageable.  Goes away on her own if feels she'll explode which she does at least 4 times weekly.  Same as on [name].  Patient with chronic manageable anxiety.  Patient denies difficulty with sleep initiation or maintenance. Denies appetite disturbance.  Patient reports that energy and motivation have been good.  Patient denies any difficulty with concentration.  Patient denies any suicidal ideation. Tolerating lithium without GI problems she had in the past. Plan: no med changes. She agrees to continue the lithium back to 1200 mg daily.  Labs good as noted lithium 0.8 on 1200.  04/02/2020 patient called for an urgent appointment today: She called on 03/20/2020 asking for something for anxiety.  Because she was on stimulant we gave her hydroxyzine 10 to 20 mg 3 times daily as needed. Says she doubted bipolar dx and now believes it. "I'm so manic".  Can't stop it.  I'm so mean and I can't stop it.  Tense inside and racing inside. Hydroxyzine didn't help. Recognized mania last week.  Felt high and euphoric.  Not crazy thoughts but impulsive and increased spending of $200 in arc[place] yesterday.  Is sleeping bc is exhausted.  Chronic MS fatigue.  Hyperactive.  More research online.  Hypomanic is her self dx.  Increased grandiosity of seeing things more clearly.  Impulsive and irritable alternating with euphoria. Can't stop herself. Been consistent with lithium.  Ran out of lamotrigine several weeks ago and forgot to pick it up.  Close friend noticed the changes and saying she was talking fast. H  doesn't believe in mental health problems. Plan: Disc dx mania which she had previously denied but now clearly recognizes.  She has a history of taking [name] with benefit but stopped it due to cost.  She believes her insurance will cover it now because she has a different policy.  We will start with 60 mg but increase to 120 mg very quickly because she is likely to need the higher dose to manage the mania. She agrees to continue the lithium back to 1200 mg daily.  Labs good as noted lithium 0.8 on 1200. For mania, start [name] 120 mg which she's taken before. Klonopin 1 mg TID until mania resolves  04/11/2020 appointment with the following noted:  Seen urgently to follow-up recent mania.  Seen as a work in appointment. I feel much better. Mania more controlled but still feels it.  Arm had been numb from anxiety and it's better but not gone.  Not racing as much.  Mind is clearer.  Still a little distractible but less.  Still feels a little too upbeat. Extreme diarrhea and lost 8# in the week.  Only change was [name].  The first 4-5 nights was sedated but not last night. Doesn't like clonazepam bc very drowsy with it and only takes it when arm goes numb.  Taking only 1 every 2 days. She agrees to continue the lithium back to 1200 mg daily.  Labs good as noted lithium 0.8 on 1200. For mania, continue  [name] 120 mg which she's taken before.  It's helping. Start immodium prn  For diarrhea. Expect diarrhea will resolve as patient gets more used to the [name].  She started right out at a high dose which is unusual. Klonopin 1 mg TID prn  until mania resolves.  Good that she does not use much bc of [name].   04/16/20 TC's. Pt called and said that she is in the middle of a maniac state. She said that she is taking latuda and klonopin. However the klonopin is making her heart race and she is unable to rest. She has tried cutting it in half and still the same. She would like something else called into her  pharmacy so she can rest She recently switched back to [name] 120 mg which is so far not managing the mania.  She continues on lithium. The clonazepam is not making her heart race.  That is caused by the mania.  However I know she does not like the clonazepam so I will switch it to lorazepam 1 mg every 6 hours as needed agitation or insomnia. To better control the mania we will temporarily add haloperidol 5 mg nightly.  She is always concerned about weight gain and haloperidol does not have a significant risk of headache weight gain and is a good antimanic med.  Once the mania is controlled for a week or 2 then she can stop the haloperidol.   07/02/20 appt noted: Took Haldol 5 mg for a week and it calmed down and stopped it. It caused drowsiness. Manic sx resolved but still real irritable.  Insurance covering [place] so far.  Threw clonazepam away bc didn't seem to help.  Took lorazepam 1 mg and it seemed to have better calming effect.  Irritability in sudden bursts of 30 min and then resolves.  Cannot control it well.  Kind of weepy too.   EFA.  Sleep 7 hours.  Good function. On lithium 1200, [name] 120, [name] 60. Off lamotrigine since October.  GI rx colestipol and diarrhea stopped.  Took Ritalin 10 days without [name] and irritability without change. Tolerating medications  Plan: Add Luvox 50 mg HS for anxiety and irritability.  09/17/20 appt noted: Fluvoxamine for 3 weeks caused worsening runny nose and stopped.  Irritability got better and hasn't returned.  Mood has been good.  Anxiety is fine with nothing off the charts.  Sleep is good. SE only runny nose.  Diarrhea managed with colestipol. Plan: She agrees to continue the lithium back to 1200 mg daily.  08/13/20 lithium 1200= level 1.2 Diarrhea resolved with colestipol. Continue [name] 120  01/19/2021 appointment with the following noted: Doing good.  Pretty stable since here.   CO weight gain eating at  night to take it.  Too sleepy  if takes with dinner. Not had Covid. CO lorazepam 1 mg prn anxiety not being effective.  Using it 3 times weekly. Plan: OK change BZ to infrequent use of Xanax 0.5-1 mg prn anxiety about 3 times per week. DC lorazepam BC ineffective.  05/19/2021 appt noted: Wants to change [name] bc nose running and no sex drive. Mood good without mania nor depression. No increased SE lithium and no tremors.   Not irritability nor agitation. Patient reports stable mood and denies depressed or irritable moods.  Patient denies any recent difficulty with anxiety.  Patient denies difficulty with sleep initiation or maintenance. Denies appetite disturbance.  Patient reports that energy and motivation have been good.  Patient denies any difficulty with concentration.  Patient denies any suicidal ideation. Plan: CO runny nose and SE [name] 120 mg daily.  Did ok without it briefly in past  Trial reduction [name] to 60 mg daily. If starts having mood sx start Vrayalar 1.5 mg and increase to 3 mg daily.  07/22/2021 appointment with the following noted: Less runny nose with less [name] at 60 mg daily and feels her personality.   Asks about reducing lithium. No diarrhea on [place] but switched to low dose Ozempic. Mood stable .  Patient reports stable mood and denies depressed or irritable moods.  Patient denies any recent difficulty with anxiety.  Patient denies difficulty with sleep initiation or maintenance. Denies appetite disturbance.  Patient reports that energy and motivation have been good.  Patient denies any difficulty with concentration.  Patient denies any suicidal ideation. Plan: CO runny nose and SE [name] 120 mg daily.  Did ok without it briefly in past  Ok with reduction [name] to 60 mg daily. If starts having mood sx start Vrayalar 1.5 mg and increase to 3 mg daily.  08/28/2021 phone call with mixed manic symptoms.  With some thoughts of death yes thank you with no suicidal plan. MD response: The most  reliably effective strategy would be to increase the [name] back to 80 or more milligrams because we know that has worked before.  However she has complained of a runny nose and wanted the dose reduced.  Obviously the lower dose of [name] is not adequate.  As we discussed at the last appointment we can try Vraylar but there is no guarantee that it will work.  If she wants to try something unproven in order to try to avoid the runny nose side effect of [name], then she can pick up some samples of Vraylar 1.5 mg each morning for 1 week and then increase to 3 mg daily and continue the current dose of [name] for now.  09/07/21 appt noted:   Did not start Vraylar yet bc had sx. Calmed some now.  Was cycling from fine to cloudy and confused with racing thoughts.  Somewhat exhausted from the hypomania to normal ultra rapid cycling. Asked about dosing andn SE Vraylar and wanted to know before starting it. Burning bridges left and right last week. Plan: We will wean [name] over 10 days or so and start Vraylar 1.5 mg for 1 week and then increase to 3 mg daily.  09/28/2021 appointment with the following noted: LOving the Vraylar.  Clearer mind. Mood seems stable and out of the swing she was in. Irritability and depression resolved. SE none so far. Runny nose lessened but didn't resolve. Needs Xanax to go to sleep bc anxious thoughts at night.  Then awakens 3  and restless thereafter. Goes to bed 930 and enough sleep. Energy chronically low with MS. Better interests with Vraylar bc dulled with [name] Plan: Trial clonidine off label for anxiety and agitation 0.1 mg  Continue Vraylar 3 mg daily New alprazolam 0.5 mg 3 times daily as needed anxiety, Continue [name] 60 mg daily for MS fatigue Continue lithium 1200 mg daily  10/27/2021 appointment with the following noted: Still loves the Vraylar and is much better with it.  Irritability is so much better H notices. Still some anxiety but not extreme,  usually. Last week had episode of driving anxiety and had to pull over but it's not always that bad. Takes Xanax in evening and not day bc gets groggy with it. Overall feels the anxiety is manageable though it is worse in the evening and she has to take Xanax in the evening.  However because the mood symptoms are so much more stable she feels like the anxiety is more manageable as well. SE mild restlessness You should put everybody on Vraylar.  More of her personality. Plan: no med changes  03/17/22 appt noted: I still think it is a miracle drug.  Some weeks thinks she should go up a dose bc can tell mild cycling but doesn't feel bad.  I can just tell.  But could n't tolerate morte restlessness which is persistent about the same but some days not noticeable but usually only 2-3 hours in the AM and then fine.  Much less irritable with Vraylar. Otherwise SE ok. Thinks may need something more for anxiety.  L arm goes numb with anxiety and did so in her 78s with anxiety and it makes her real scared.  Happens daily in the evening. Takes Xanax in PM when it happens and it lessens the sx. But needs to take 1 mg Xanax to help this. Would like something more to prevent this if possible. Plan: no med changes  06/10/22 TC: Patient said she is manic. She is only getting 1-2 hours of sleep a night, unable to get to sleep or stay asleep. Feels like she is losing her mind, risky behaviors, acting out of character, feels like she is crawling out of her skin.  Denies irritability, yelling. Denies depression, "happy as a clam." Anxiety is 8/10. No SI.  Meds:  Alprazolam - 1-2 TID, needs that much currently, doesn't need it daily  Propranolol - stopped it because she didn't feel it was helpful  Lithium 300 mg, 4 capsules at bedtime  Vraylar 3 mg qd - said it was a miracle drug until it wasn't.  Is requesting something for sleep in addition to needing adjustment of meds.  MD response:  For mania and increase the  Vraylar to 6 mg doubling up on the current 3 mg capsules and call us back next week and let us know how she is doing.  For sleep there is not a lot that I can give her that is compatible with the Xanax.  She can take Xanax up to 2 mg at night if it will work. Make sure she is taking 4 capsules of lithium every day and please get the lithium level as soon as possible.     07/22/22 appt noted: Taking Xanax 0.5 mg HS, Vraylar 6, lithium 1200 mg .  No propranolol bc didn't do anything.  I think I'm normal .  Is sleeping again 9-6..  With mania was worst thing Had compulsive gambling $40K online and didn't care.  Never did  it before.  Wasn't eating while manic and then started eating more.   Seems like a blur.   Also spending too much on things she didn't need.  Didn't realize she was manic until 2 weeks in.  Couldn't sleep.  When increased Vraylar gradually better for 3 weeks to baseline.  Best friend said she thought something was off.  Only lately told H she was bipolar.   F has dementia and multiple myeloma and living with them at 42 yo.   No SE.   Mood is still good without depression and mania.   Having a lot of anxiety but not panic.  Worry over wrecks or people dying.  Last few years anxiety over driving or being late.   Plan: no changes  09/29/22 appt noted: Less irritable and very good since increasing the Vraylar. No sig mania.  Depression under control. F passed away with normal grief.  No SE Meds as above.   Sleep good with Xanax.  Some awakening. Health is ok.  Started Ozempic which is constipating. No med changes  12/30/2022 appointment noted: Psych meds: Alprazolam 0.5 mg daily as needed anxiety, Vraylar 6 mg daily, [name] 60 mg a.m., lithium 1200 mg nightly, Have tremor but not taking propranolol. Still doing well on Vraylar "miracle drug".  No mania and no depression. On Ozempic 2 and lost 15 #.   No other sig SE Still some anxiety worse at night.  Using CBT at night to  help. Sleep is good taking Xanax without SE   past psych meds:  [name] 1500 good resp but hair loss,, lithium 1200, CBZ, Equetro helped but GI side effects, Trileptal,  lamotrigine, [name] retrial 10 GI, Rexulti, Vraylar  [name] 120 helped but SE runny nose and low libido Seroquel low dosages with weight gain, Haldol 5 drowsy  Zoloft 150, Trintellix, , mirtazapine, Lexapro, Duloxetine side effects, long history of sertraline, Lexapro headaches, Trintellix,  doxazosin.   trazodone, mirtazapine,   Ambien too sedated Clonazepam less effective than lorazepam not as good as Xanax Remote high dose Xanax 2 mg.  Clonidine 0.1 mg NR and no SE  Adderall for MS fatigue, [name] for MS fatigue, armodafinil NR, Ritalin NR 20 BID without [name]  Review of Systems:  Review of Systems  Constitutional:  Positive for fatigue.  HENT:         Runny nose  Gastrointestinal:  Negative for diarrhea, nausea and vomiting.  Skin:        Hair loss  Neurological:  Negative for tremors and weakness.       Balance problems  Psychiatric/Behavioral:  Negative for agitation, behavioral problems, confusion, decreased concentration, dysphoric mood, hallucinations, self-injury, sleep disturbance and suicidal ideas. The patient is nervous/anxious. The patient is not hyperactive.   Depression resolved  Medications: I have reviewed the patient's current medications.  Current Outpatient Medications  Medication Sig Dispense Refill   atorvastatin (LIPITOR) 20 MG tablet Take 20 mg by mouth daily.     Cholecalciferol (VITAMIN D3) 10000 units TABS Take by mouth.     colestipol (COLESTID) 1 g tablet Take 2 g by mouth daily.     lisdexamfetamine ([name]) 60 MG capsule Take 1 capsule (60 mg total) by mouth every morning. 30 capsule 0   ocrelizumab (OCREVUS) 300 MG/10ML injection Inject into the vein.     OZEMPIC, 2 MG/DOSE, 8 MG/3ML SOPN Inject 2 mg into the skin once a week.     rizatriptan (MAXALT-MLT) 10 MG  disintegrating tablet Take  1 tablet (10 mg total) by mouth as needed for migraine. May repeat in 2 hours if needed 9 tablet 11   SPRINTEC 28 0.25-35 MG-MCG tablet      valACYclovir (VALTREX) 1000 MG tablet 2000 mg every 12 hours for 2 doses. 8 tablet 3   ALPRAZolam (XANAX) 0.5 MG tablet 1 at night and 1 tablet daily as needed for anxiety 45 tablet 3   Cariprazine HCl (VRAYLAR) 6 MG CAPS Take 1 capsule (6 mg total) by mouth daily. 90 capsule 0   lithium carbonate 300 MG capsule TAKE 4 CAPSULES BY MOUTH AT BEDTIME 360 capsule 0   propranolol (INDERAL) 20 MG tablet TAKE 1-2 TABLETS TWICE DAILY AS NEEDED FOR RESTLESSNESS OR ANXIETY (Patient not taking: Reported on 09/29/2022) 360 tablet 0   No current facility-administered medications for this visit.    Medication Side Effects: .diarrhea resolved.  [name] runny nose improved with dose reduction  Allergies: No Known Allergies  Past Medical History:  Diagnosis Date   Anxiety    Depression    Diabetes type 2, controlled (HCC)    Elevated hemoglobin A1c    Hyperlipidemia    Migraine    Multiple sclerosis (HCC)    receives Tysabri infusion q 28 days    Family History  Problem Relation Age of Onset   Diabetes Mother    Hypertension Mother    Dementia Mother    Bipolar disorder Mother    Heart failure Father    Hypertension Father    Heart disease Father    Ovarian cancer Maternal Grandmother    Alzheimer's disease Paternal Grandmother    Skin cancer Maternal Grandfather     Social History   Socioeconomic History   Marital status: Married    Spouse name: Rollen Sox   Number of children: 1   Years of education: MA   Highest education level: Not on file  Occupational History    Employer: [employer]  Tobacco Use   Smoking status: Never   Smokeless tobacco: Never  Vaping Use   Vaping status: Never Used  Substance and Sexual Activity   Alcohol use: Yes    Comment: socially; once a week   Drug use: No   Sexual activity:  Yes    Birth control/protection: Pill  Other Topics Concern   Not on file  Social History Narrative   Patient lives at home with family.   Caffeine Use: 1-2 cups daily   No tobacco, recreational drugs. Social drinking only.   Wears her seatbelt, has smoke alarm in the home.   Feels safe in her relationships.   Social Determinants of Health   Financial Resource Strain: Not on file  Food Insecurity: No Food Insecurity (06/09/2021)   Received from [facility], [facility]   Hunger Vital Sign    Worried About Running Out of Food in the Last Year: Never true    Ran Out of Food in the Last Year: Never true  Transportation Needs: Not on file  Physical Activity: Not on file  Stress: Not on file  Social Connections: Unknown (09/06/2021)   Received from [facility], [facility]   Social Network    Social Network: Not on file  Intimate Partner Violence: Unknown (08/12/2021)   Received from [facility], [facility]   HITS    Physically Hurt: Not on file    Insult or Talk Down To: Not on file    Threaten Physical Harm: Not on file    Scream or  Curse: Not on file    Past Medical History, Surgical history, Social history, and Family history were reviewed and updated as appropriate.   Please see review of systems for further details on the patient's review from today.   Objective:   Physical Exam:  There were no vitals taken for this visit.  Physical Exam Constitutional:      General: She is not in acute distress. Musculoskeletal:        General: No deformity.  Neurological:     Mental Status: She is alert and oriented to person, place, and time.     Cranial Nerves: No dysarthria.     Coordination: Coordination normal.  Psychiatric:        Attention and Perception: Attention and perception normal. She does not perceive auditory or visual hallucinations.        Mood and Affect: Mood is anxious. Mood is not depressed. Affect is not angry or tearful.        Speech: Speech  is not rapid and pressured.        Behavior: Behavior is not agitated, aggressive or hyperactive. Behavior is cooperative.        Thought Content: Thought content normal. Thought content is not paranoid or delusional. Thought content does not include homicidal or suicidal ideation. Thought content does not include suicidal plan.        Cognition and Memory: Cognition and memory normal.        Judgment: Judgment normal.     Comments: Insight intact No mania      Lab Review:     Component Value Date/Time   NA 135 09/29/2022 1014   NA 137 04/15/2014 1554   K 4.5 09/29/2022 1014   CL 105 09/29/2022 1014   CO2 21 09/29/2022 1014   GLUCOSE 167 (H) 09/29/2022 1014   BUN 19 09/29/2022 1014   BUN 15 04/15/2014 1554   CREATININE 0.81 09/29/2022 1014   CALCIUM 9.8 09/29/2022 1014   PROT 6.2 (L) 10/22/2016 1009   PROT 6.9 12/29/2015 1400   ALBUMIN 3.3 (L) 10/22/2016 1009   ALBUMIN 4.5 12/29/2015 1400   AST 26 10/22/2016 1009   ALT 25 10/22/2016 1009   ALKPHOS 81 10/22/2016 1009   BILITOT 1.0 10/22/2016 1009   BILITOT 0.6 12/29/2015 1400   GFRNONAA >60 12/25/2016 1254   GFRAA >60 12/25/2016 1254       Component Value Date/Time   WBC 8.1 12/25/2016 1254   RBC 4.34 12/25/2016 1254   HGB 12.0 12/25/2016 1254   HGB 11.9 06/30/2016 1122   HCT 35.8 (L) 12/25/2016 1254   HCT 36.2 06/30/2016 1122   PLT 255 12/25/2016 1254   PLT 237 06/30/2016 1122   MCV 82.5 12/25/2016 1254   MCV 81 06/30/2016 1122   MCH 27.6 12/25/2016 1254   MCHC 33.5 12/25/2016 1254   RDW 14.5 12/25/2016 1254   RDW 14.6 06/30/2016 1122   LYMPHSABS 1.6 10/22/2016 1009   LYMPHSABS 3.6 (H) 06/30/2016 1122   MONOABS 0.3 10/22/2016 1009   EOSABS 0.1 10/22/2016 1009   EOSABS 0.1 06/30/2016 1122   BASOSABS 0.0 10/22/2016 1009   BASOSABS 0.0 06/30/2016 1122    Lithium Lvl  Date Value Ref Range Status  09/29/2022 0.8 0.6 - 1.2 mmol/L Final  09/29/22 lithium 0.8 on 1200 08/13/20 lithium 1200= level 1.2 No results  found for: "PHENYTOIN", "PHENOBARB", "VALPROATE", "CBMZ"   .res Assessment: Plan:     Jan was seen today for follow-up, depression and anxiety.  Diagnoses and all orders for this visit:  Bipolar I disorder, most recent episode mixed (HCC) -     Cariprazine HCl (VRAYLAR) 6 MG CAPS; Take 1 capsule (6 mg total) by mouth daily.  Panic disorder with agoraphobia -     ALPRAZolam (XANAX) 0.5 MG tablet; 1 at night and 1 tablet daily as needed for anxiety  Generalized anxiety disorder  Insomnia due to mental condition  Lithium use  Bipolar 1 disorder, manic, moderate (HCC) -     lithium carbonate 300 MG capsule; TAKE 4 CAPSULES BY MOUTH AT BEDTIME   30-minute face to face time with patient was spent on counseling and coordination of care. We discussed her chronically unstable bipolar disorder with a history of abrupt worsening of depression with suicidal ideation.  Most recently she has been manic. Has tended to be sensitive and have difficulty with various medications and hence has taken multiple medications as noted.   Overall mood sx were stable on [name] 120 mg daily but she complained of rhinorrhea which was persistent and the dose was gradually reduced to 60 mg daily at which point she began to have rapid mixed cycling.  She does not want to go back up in the [place] and wants to try something else.  She has not been on Vraylar before.  We discussed the side effects in detail.  Mania resolved on Vraylar 6 mg daily.  Based on her history she may have to go higher than that but we need to be cautious because of her history of intolerance of various medications.  Of note, the hx irritability is not due to [name] because she switched to Ritalin and the irritability was unchanged. [name] being taken from neuro for MS fatigue.  MS stable.  She agrees to continue the lithium 1200 mg daily.  5/24 =0.8 level Repeat sooner than 6 mos if loses 15# or more with ozempic.   Diarrhea  resolved with colestipol.  She stopped this bc of other reasons but tolerating the lithium. Counseled patient regarding potential benefits, risks, and side effects of lithium to include potential risk of lithium affecting thyroid and renal function.  Discussed need for periodic lab monitoring to determine drug level and to assess for potential adverse effects.  Counseled patient regarding signs and symptoms of lithium toxicity and advised that they notify office immediately or seek urgent medical attention if experiencing these signs and symptoms.  Patient advised to contact office with any questions or concerns.  Xanax 0.5-1 mg prn anxiety or sleep.  No med changes.  Continue : Alprazolam 0.5 mg daily as needed anxiety, Vraylar 6 mg daily, [name] 60 mg a.m., lithium 1200 mg nightly,  Her insomnia is managed fairly well.  Follow-up 3 mos  Meredith Staggers MD, DFAPA   Please see After Visit Summary for patient specific instructions.  Future Appointments  Date Time Provider Department Center  01/20/2023  9:30 AM Yates Decamp, MD [facility] None       No orders of the defined types were placed in this encounter.      -------------------------------

## 2023-01-20 ENCOUNTER — Ambulatory Visit: Payer: Managed Care, Other (non HMO) | Admitting: Cardiology

## 2023-01-20 ENCOUNTER — Encounter: Payer: Self-pay | Admitting: Cardiology

## 2023-01-20 VITALS — BP 116/81 | HR 91 | Resp 16 | Ht 71.0 in | Wt 228.0 lb

## 2023-01-20 DIAGNOSIS — E1165 Type 2 diabetes mellitus with hyperglycemia: Secondary | ICD-10-CM

## 2023-01-20 DIAGNOSIS — R9431 Abnormal electrocardiogram [ECG] [EKG]: Secondary | ICD-10-CM

## 2023-01-20 DIAGNOSIS — R0609 Other forms of dyspnea: Secondary | ICD-10-CM

## 2023-01-20 DIAGNOSIS — Z8249 Family history of ischemic heart disease and other diseases of the circulatory system: Secondary | ICD-10-CM

## 2023-01-20 DIAGNOSIS — E7800 Pure hypercholesterolemia, unspecified: Secondary | ICD-10-CM

## 2023-01-20 MED ORDER — ATORVASTATIN CALCIUM 40 MG PO TABS
40.0000 mg | ORAL_TABLET | Freq: Every day | ORAL | 1 refills | Status: DC
Start: 2023-01-20 — End: 2023-05-12

## 2023-01-20 MED ORDER — METOPROLOL TARTRATE 50 MG PO TABS
50.0000 mg | ORAL_TABLET | Freq: Three times a day (TID) | ORAL | 0 refills | Status: DC
Start: 2023-01-20 — End: 2023-03-31

## 2023-01-20 MED ORDER — ASPIRIN 81 MG PO CHEW
81.0000 mg | CHEWABLE_TABLET | Freq: Every day | ORAL | Status: AC
Start: 2023-01-20 — End: ?

## 2023-01-20 NOTE — Progress Notes (Signed)
Primary Physician/Referring:  Sheliah Hatch, PA-C  Patient ID: Anita Black, female    DOB: [birth year], 42 y.o.   MRN: [id number]  Chief Complaint  Patient presents with   DOE   New Patient (Initial Visit)   HPI:    Anita Black  is a 41 y.o. Caucasian female patient with multiple sclerosis and presently being treated with Tysarbi, chronic migraine headaches, bipolar disorder, diabetes mellitus, hypercholesterolemia presents for evaluation of new onset dyspnea on exertion that started about 3 months ago.  Patient is clearly noticed marked dyspnea doing even minimal activities to a point where she is now afraid to do activities without having to stop as she feels very uncomfortable.  Denies chest pain, chest discomfort.  No other associated symptoms.  States that she was under stress as she took care of her dad who recently passed on hospice with multiple coronary events, hence initially thought that her dyspnea may have been related to stress but even after he passed, she has not been able to do any activities without having to feel markedly dyspneic and had to stop her activities as she feels very uncomfortable.  No PND orthopnea, no palpitations, dizziness or syncope.  Past Medical History:  Diagnosis Date   Anxiety    Depression    Diabetes type 2, controlled (HCC)    Elevated hemoglobin A1c    Hyperlipidemia    Migraine    Multiple sclerosis (HCC)    receives Tysabri infusion q 28 days   Past Surgical History:  Procedure Laterality Date   ABDOMINOPLASTY     CESAREAN SECTION     CHOLECYSTECTOMY  2001   Family History  Problem Relation Age of Onset   Diabetes Mother    Hypertension Mother    Dementia Mother    Bipolar disorder Mother    Heart attack Father 35   Heart failure Father    Hypertension Father    Heart disease Father    Stroke Father     Social History   Tobacco Use   Smoking status: Former    Current packs/day: 0.50    Average packs/day:  0.5 packs/day for 17.7 years (8.9 ttl pk-yrs)    Types: Cigarettes    Start date: 2007   Smokeless tobacco: Never  Substance Use Topics   Alcohol use: Yes    Comment: socially; once a week   Marital Status: Married  ROS  Review of Systems  Cardiovascular:  Positive for dyspnea on exertion. Negative for chest pain and leg swelling.   Objective      01/20/2023    9:01 AM 03/17/2022    9:23 AM 09/07/2021    9:44 AM  Vitals with BMI  Height 5\' 11"     Weight 228 lbs    BMI 31.81    Systolic 116    Diastolic 81    Pulse 91       Information is confidential and restricted. Go to Review Flowsheets to unlock data.   Blood pressure 116/81, pulse 91, resp. rate 16, height 5\' 11"  (1.803 m), weight 228 lb (103.4 kg), SpO2 98%.  Physical Exam Neck:     Vascular: No carotid bruit or JVD.  Cardiovascular:     Rate and Rhythm: Normal rate and regular rhythm.     Pulses: Intact distal pulses.     Heart sounds: Normal heart sounds. No murmur heard.    No gallop.  Pulmonary:     Effort: Pulmonary effort is normal.  Breath sounds: Normal breath sounds.  Abdominal:     General: Bowel sounds are normal.     Palpations: Abdomen is soft.  Musculoskeletal:     Right lower leg: No edema.     Left lower leg: No edema.    Laboratory examination:   Recent Labs    09/29/22 1014  NA 135  K 4.5  CL 105  CO2 21  GLUCOSE 167*  BUN 19  CREATININE 0.81  CALCIUM 9.8    Lab Results  Component Value Date   GLUCOSE 167 (H) 09/29/2022   NA 135 09/29/2022   K 4.5 09/29/2022   CL 105 09/29/2022   CO2 21 09/29/2022   BUN 19 09/29/2022   CREATININE 0.81 09/29/2022   GFRNONAA >60 12/25/2016   CALCIUM 9.8 09/29/2022   PROT 6.2 (L) 10/22/2016   ALBUMIN 3.3 (L) 10/22/2016   LABGLOB 2.7 04/15/2014   AGRATIO 1.6 04/15/2014   BILITOT 1.0 10/22/2016   ALKPHOS 81 10/22/2016   AST 26 10/22/2016   ALT 25 10/22/2016   ANIONGAP 13 12/25/2016      Lab Results  Component Value Date   ALT  25 10/22/2016   AST 26 10/22/2016   ALKPHOS 81 10/22/2016   BILITOT 1.0 10/22/2016       Latest Ref Rng & Units 12/25/2016   12:54 PM 10/22/2016   10:09 AM 09/17/2016    1:53 PM  CBC  WBC 4.0 - 10.5 K/uL 8.1  7.6  8.4   Hemoglobin 12.0 - 15.0 g/dL 42.5  95.6  38.7   Hematocrit 36.0 - 46.0 % 35.8  30.7  39.0   Platelets 150 - 400 K/uL 255  231  251        Latest Ref Rng & Units 10/22/2016   10:09 AM 09/17/2016    1:53 PM 01/30/2016    3:55 PM  Hepatic Function  Total Protein 6.5 - 8.1 g/dL 6.2  7.6  6.8   Albumin 3.5 - 5.0 g/dL 3.3  4.2  4.2   AST 15 - 41 U/L 26  17  13    ALT 14 - 54 U/L 25  16  18    Alk Phosphatase 38 - 126 U/L 81  101  94   Total Bilirubin 0.3 - 1.2 mg/dL 1.0  0.9  0.6     Lipid Panel No results for input(s): "CHOL", "TRIG", "LDLCALC", "VLDL", "HDL", "CHOLHDL", "LDLDIRECT" in the last 8760 hours.  HEMOGLOBIN A1C Lab Results  Component Value Date   HGBA1C 6.5 09/30/2016   TSH Recent Labs    09/29/22 1014  TSH 3.12    External labs:   Cholesterol, total 194.000 m 04/12/2022 HDL 95.000 mg 04/12/2022 LDL 92.000 mg 11/16/2022 Triglycerides 95.000 mg 04/12/2022  A1C 7.100 % 11/16/2022 TSH 3.120 09/29/2022  Hemoglobin 13.200 g/d 07/30/2022  Creatinine, Serum 0.890 mg/ 11/16/2022 Potassium 4.500 mm 11/16/2022 ALT (SGPT) 36.000 U/L 11/16/2022   Labs 01/11/2023:  LFTs normal.  Serum creatinine 0.86, EGFR 86 mL.  Hb 13.3/HCT 38.8, platelets 311.  Radiology:    Cardiac Studies:   NA  EKG:   EKG 01/20/2023: Normal sinus rhythm at rate of 87 bpm, LAE, normal axis, incomplete right bundle branch block, diffuse nonspecific T wave abnormality.    Medications and allergies  No Known Allergies  Medication list   Current Outpatient Medications:    ALPRAZolam (XANAX) 0.5 MG tablet, 1 at night and 1 tablet daily as needed for anxiety, Disp: 45 tablet, Rfl: 3  aspirin (ASPIRIN CHILDRENS) 81 MG chewable tablet, Chew 1 tablet (81 mg total) by mouth daily.,  Disp: , Rfl:    Cariprazine HCl (VRAYLAR) 6 MG CAPS, Take 1 capsule (6 mg total) by mouth daily., Disp: 90 capsule, Rfl: 0   Cholecalciferol (VITAMIN D3) 10000 units TABS, Take by mouth., Disp: , Rfl:    lisdexamfetamine ([name]) 60 MG capsule, Take 1 capsule (60 mg total) by mouth every morning., Disp: 30 capsule, Rfl: 0   lithium carbonate 300 MG capsule, TAKE 4 CAPSULES BY MOUTH AT BEDTIME, Disp: 360 capsule, Rfl: 0   metoprolol tartrate (LOPRESSOR) 50 MG tablet, Take 1 tablet (50 mg total) by mouth in the morning, at noon, and at bedtime. Take 1 day before your CT scan and on the day of your CT scan, Disp: 6 tablet, Rfl: 0   ocrelizumab (OCREVUS) 300 MG/10ML injection, Inject into the vein., Disp: , Rfl:    OZEMPIC, 2 MG/DOSE, 8 MG/3ML SOPN, Inject 2 mg into the skin once a week., Disp: , Rfl:    rizatriptan (MAXALT-MLT) 10 MG disintegrating tablet, Take 1 tablet (10 mg total) by mouth as needed for migraine. May repeat in 2 hours if needed, Disp: 9 tablet, Rfl: 11   SPRINTEC 28 0.25-35 MG-MCG tablet, , Disp: , Rfl:    valACYclovir (VALTREX) 1000 MG tablet, 2000 mg every 12 hours for 2 doses., Disp: 8 tablet, Rfl: 3   atorvastatin (LIPITOR) 40 MG tablet, Take 1 tablet (40 mg total) by mouth daily., Disp: 90 tablet, Rfl: 1   Assessment     ICD-10-CM   1. DOE (dyspnea on exertion)  R06.09 EKG 12-Lead    metoprolol tartrate (LOPRESSOR) 50 MG tablet    CT CORONARY MORPH W/CTA COR W/SCORE W/CA W/CM &/OR WO/CM    aspirin (ASPIRIN CHILDRENS) 81 MG chewable tablet    ECHOCARDIOGRAM COMPLETE    2. Abnormal electrocardiogram  R94.31 CT CORONARY MORPH W/CTA COR W/SCORE W/CA W/CM &/OR WO/CM    aspirin (ASPIRIN CHILDRENS) 81 MG chewable tablet    ECHOCARDIOGRAM COMPLETE    3. Pure hypercholesterolemia  E78.00 Lipid Panel With LDL/HDL Ratio    atorvastatin (LIPITOR) 40 MG tablet    4. Type 2 diabetes mellitus with hyperglycemia, without long-term current use of insulin (HCC)  E11.65 COMPLETE  METABOLIC PANEL WITH GFR    5. Family history of premature CAD: Father with CAD & MI at age 39 Y  Z55.49 CT CORONARY MORPH W/CTA COR W/SCORE W/CA W/CM &/OR WO/CM    aspirin (ASPIRIN CHILDRENS) 81 MG chewable tablet       Orders Placed This Encounter  Procedures   CT CORONARY MORPH W/CTA COR W/SCORE W/CA W/CM &/OR WO/CM    Standing Status:   Future    Standing Expiration Date:   04/21/2023    Order Specific Question:   If indicated for the ordered procedure, I authorize the administration of contrast media per Radiology protocol    Answer:   Yes    Order Specific Question:   Is patient pregnant?    Answer:   No    Order Specific Question:   Preferred Imaging Location?    Answer:   [facility]   Lipid Panel With LDL/HDL Ratio   COMPLETE METABOLIC PANEL WITH GFR   EKG [id number]   ECHOCARDIOGRAM COMPLETE    Standing Status:   Future    Standing Expiration Date:   01/20/2024    Order Specific Question:   Where should this test  be performed    Answer:   [facility])    Order Specific Question:   Does the patient weigh less than or greater than 250 lbs?    Answer:   Patient weighs less than 250 lbs    Order Specific Question:   Perflutren DEFINITY (image enhancing agent) should be administered unless hypersensitivity or allergy exist    Answer:   Administer Perflutren    Order Specific Question:   Reason for exam-Echo    Answer:   Dyspnea  R06.00    Order Specific Question:   Reason for exam-Echo    Answer:   Abnormal ECG  R94.31    Meds ordered this encounter  Medications   metoprolol tartrate (LOPRESSOR) 50 MG tablet    Sig: Take 1 tablet (50 mg total) by mouth in the morning, at noon, and at bedtime. Take 1 day before your CT scan and on the day of your CT scan    Dispense:  6 tablet    Refill:  0   aspirin (ASPIRIN CHILDRENS) 81 MG chewable tablet    Sig: Chew 1 tablet (81 mg total) by mouth daily.   atorvastatin (LIPITOR) 40 MG tablet    Sig:  Take 1 tablet (40 mg total) by mouth daily.    Dispense:  90 tablet    Refill:  1    Medications Discontinued During This Encounter  Medication Reason   colestipol (COLESTID) 1 g tablet    propranolol (INDERAL) 20 MG tablet    atorvastatin (LIPITOR) 20 MG tablet Reorder     Recommendations:   Anita Black is a 42 y.o.  Caucasian female patient with multiple sclerosis and presently being treated with Tysarbi, chronic migraine headaches, bipolar disorder, diabetes mellitus, hypercholesterolemia presents for evaluation of new onset dyspnea on exertion that started about 3 months ago.  1. DOE (dyspnea on exertion) Patient is clearly noticed marked dyspnea doing even minimal activities to a point where she is now afraid to do activities without having to stop as she feels very uncomfortable.  Her symptoms are very concerning for anginal equivalence.  She has significant cardiovascular risk factors that includes hypercholesterolemia, diabetes mellitus and also a very strong family history of premature coronary artery disease in her father.  I have scheduled her for coronary CT angiogram.  Will start her on aspirin 81 mg daily, her blood pressure is very soft to add a beta-blocker therapy however beta-blocker was prescribed prior to her CT scan to induce bradycardia.  Advised her to take it easy over the next few weeks until evaluation is complete.  - EKG 12-Lead - metoprolol tartrate (LOPRESSOR) 50 MG tablet; Take 1 tablet (50 mg total) by mouth in the morning, at noon, and at bedtime. Take 1 day before your CT scan and on the day of your CT scan  Dispense: 6 tablet; Refill: 0 - CT CORONARY MORPH W/CTA COR W/SCORE W/CA W/CM &/OR WO/CM; Future - aspirin (ASPIRIN CHILDRENS) 81 MG chewable tablet; Chew 1 tablet (81 mg total) by mouth daily. - ECHOCARDIOGRAM COMPLETE; Future  2. Abnormal electrocardiogram She also has abnormal EKG and I cannot exclude anterior ischemia. - CT CORONARY MORPH  W/CTA COR W/SCORE W/CA W/CM &/OR WO/CM; Future - aspirin (ASPIRIN CHILDRENS) 81 MG chewable tablet; Chew 1 tablet (81 mg total) by mouth daily. - ECHOCARDIOGRAM COMPLETE; Future  3. Pure hypercholesterolemia Reviewed external labs, will go ahead and double up on atorvastatin from 20 mg to 40 mg  daily, goal LDL <54.  High risk disease status in view of diabetes mellitus with hyperglycemia. - Lipid Panel With LDL/HDL Ratio - atorvastatin (LIPITOR) 40 MG tablet; Take 1 tablet (40 mg total) by mouth daily.  Dispense: 90 tablet; Refill: 1  4. Type 2 diabetes mellitus with hyperglycemia, without long-term current use of insulin (HCC) She is now on Ozempic at 2 mg dose, continue the same.  Will also consider addition of Jardiance.  Her blood pressure is very soft and hence not on ACE inhibitor or ARB.  - COMPLETE METABOLIC PANEL WITH GFR  5. Family history of premature CAD: Father with CAD & MI at age 82 Y Her father was a nondiabetic and did not have smoking history but did have coronary artery disease at the age of 15 and MI.  Patient has 1 younger sibling and advised her to discuss with him regarding primary prevention as well.  I would like to see her back in 4 weeks for follow-up.  - CT CORONARY MORPH W/CTA COR W/SCORE W/CA W/CM &/OR WO/CM; Future - aspirin (ASPIRIN CHILDRENS) 81 MG chewable tablet; Chew 1 tablet (81 mg total) by mouth daily.     Yates Decamp, MD, [facility] 01/23/2023, 8:35 AM Office: [phone]

## 2023-01-21 ENCOUNTER — Encounter: Payer: Self-pay | Admitting: Cardiology

## 2023-01-23 ENCOUNTER — Encounter: Payer: Self-pay | Admitting: Cardiology

## 2023-01-24 ENCOUNTER — Encounter (HOSPITAL_BASED_OUTPATIENT_CLINIC_OR_DEPARTMENT_OTHER): Payer: Self-pay

## 2023-01-24 ENCOUNTER — Emergency Department (HOSPITAL_BASED_OUTPATIENT_CLINIC_OR_DEPARTMENT_OTHER): Payer: Managed Care, Other (non HMO)

## 2023-01-24 ENCOUNTER — Other Ambulatory Visit: Payer: Self-pay

## 2023-01-24 ENCOUNTER — Emergency Department (HOSPITAL_BASED_OUTPATIENT_CLINIC_OR_DEPARTMENT_OTHER)
Admission: EM | Admit: 2023-01-24 | Discharge: 2023-01-24 | Disposition: A | Discharge status: Home / Self Care | Payer: Managed Care, Other (non HMO) | Attending: Emergency Medicine | Admitting: Emergency Medicine

## 2023-01-24 DIAGNOSIS — R0789 Other chest pain: Secondary | ICD-10-CM | POA: Diagnosis present

## 2023-01-24 DIAGNOSIS — Z79899 Other long term (current) drug therapy: Secondary | ICD-10-CM | POA: Diagnosis not present

## 2023-01-24 DIAGNOSIS — R0602 Shortness of breath: Secondary | ICD-10-CM | POA: Insufficient documentation

## 2023-01-24 DIAGNOSIS — E119 Type 2 diabetes mellitus without complications: Secondary | ICD-10-CM | POA: Insufficient documentation

## 2023-01-24 DIAGNOSIS — Z7982 Long term (current) use of aspirin: Secondary | ICD-10-CM | POA: Diagnosis not present

## 2023-01-24 DIAGNOSIS — R079 Chest pain, unspecified: Secondary | ICD-10-CM

## 2023-01-24 LAB — BASIC METABOLIC PANEL
Anion gap: 10 (ref 5–15)
BUN: 12 mg/dL (ref 6–20)
CO2: 23 mmol/L (ref 22–32)
Calcium: 9 mg/dL (ref 8.9–10.3)
Chloride: 103 mmol/L (ref 98–111)
Creatinine, Ser: 0.74 mg/dL (ref 0.44–1.00)
GFR, Estimated: >60 mL/min (ref >60)
Glucose, Bld: 172 mg/dL — ABNORMAL HIGH (ref 70–99)
Potassium: 3.9 mmol/L (ref 3.5–5.1)
Sodium: 136 mmol/L (ref 135–145)

## 2023-01-24 LAB — CBC
HCT: 37.5 % (ref 36.0–46.0)
Hemoglobin: 12.8 g/dL (ref 12.0–15.0)
MCH: 29.6 pg (ref 26.0–34.0)
MCHC: 34.1 g/dL (ref 30.0–36.0)
MCV: 86.8 fL (ref 80.0–100.0)
Platelets: 323 10*3/uL (ref 150–400)
RBC: 4.32 MIL/uL (ref 3.87–5.11)
RDW: 12.7 % (ref 11.5–15.5)
WBC: 10 10*3/uL (ref 4.0–10.5)
nRBC: 0 % (ref 0.0–0.2)

## 2023-01-24 LAB — TROPONIN I (HIGH SENSITIVITY)
Troponin I (High Sensitivity): <2 ng/L (ref <18)
Troponin I (High Sensitivity): <2 ng/L (ref <18)

## 2023-01-24 LAB — PREGNANCY, URINE: Preg Test, Ur: NEGATIVE

## 2023-01-24 LAB — D-DIMER, QUANTITATIVE: D-Dimer, Quant: <0.27 ug{FEU}/mL (ref 0.00–0.50)

## 2023-01-24 NOTE — ED Triage Notes (Signed)
Pt presents with sudden onset of CP that started today while eating. Pt reports she has had ShOB for a "few months." Pt saw cardiology on 9/12 for the [facility] and was told she had an abnormal EKG and pt has a CT angio on 9/23. The CP today is described as stabbing and tightness that is located on the L side and radiates into neck.

## 2023-01-24 NOTE — Discharge Instructions (Signed)
Your markers of heart damage were negative making this unlikely to be an acute heart attack.  The test that did on you called a D-dimer was also negative making this unlikely to be a blood clot in the lung.  Please call your cardiologist in the morning and let them know how you are doing.  Please return for worsening symptoms especially if they occur while exercising.

## 2023-01-24 NOTE — ED Provider Notes (Signed)
[place] EMERGENCY DEPARTMENT AT [facility] Provider Note   CSN: [id number] Arrival date & time: 01/24/23  1902     History  Chief Complaint  Patient presents with   Chest Pain    Anita Black is a 42 y.o. female.  42 yo F with a chief complaints of chest pain.  This is left-sided and sharp and lasted less than an hour.  Still feels a little sore.  She was eating at the onset and was sitting at a table.  She felt some radiation to the left side of her neck.  Denies trauma to the area denies cough congestion or fever.  She has been having some shortness of breath on exertion.  This has been going on for about a month or so.  She has seen a cardiologist and they have plan to obtain a coronary CT.  Patient denies history of MI, denies hypertension or smoking.  Has a history of hyperlipidemia and diabetes.  Father had MIs in his early 72s.   Patient denies history of PE or DVT denies hemoptysis denies unilateral lower extremity edema denies recent surgery  hospitalization estrogen use or history of cancer.  Patient did travel to and from [place] in the past month.     Chest Pain      Home Medications Prior to Admission medications   Medication Sig Start Date End Date Taking? Authorizing Provider  ALPRAZolam Prudy Feeler) 0.5 MG tablet 1 at night and 1 tablet daily as needed for anxiety 12/30/22   Cottle, Steva Ready., MD  aspirin (ASPIRIN CHILDRENS) 81 MG chewable tablet Chew 1 tablet (81 mg total) by mouth daily. 01/20/23   Yates Decamp, MD  atorvastatin (LIPITOR) 40 MG tablet Take 1 tablet (40 mg total) by mouth daily. 01/20/23   Yates Decamp, MD  Cariprazine HCl (VRAYLAR) 6 MG CAPS Take 1 capsule (6 mg total) by mouth daily. 12/30/22   Cottle, Steva Ready., MD  Cholecalciferol (VITAMIN D3) 10000 units TABS Take by mouth.    [provider]  lisdexamfetamine ([name]) 60 MG capsule Take 1 capsule (60 mg total) by mouth every morning. 09/15/16   Sater, Pearletha Furl, MD   lithium carbonate 300 MG capsule TAKE 4 CAPSULES BY MOUTH AT BEDTIME 12/30/22   Cottle, Steva Ready., MD  metoprolol tartrate (LOPRESSOR) 50 MG tablet Take 1 tablet (50 mg total) by mouth in the morning, at noon, and at bedtime. Take 1 day before your CT scan and on the day of your CT scan 01/20/23 04/20/23  Yates Decamp, MD  ocrelizumab (OCREVUS) 300 MG/10ML injection Inject into the vein.    [provider]  OZEMPIC, 2 MG/DOSE, 8 MG/3ML SOPN Inject 2 mg into the skin once a week. 11/01/22   [provider]  rizatriptan (MAXALT-MLT) 10 MG disintegrating tablet Take 1 tablet (10 mg total) by mouth as needed for migraine. May repeat in 2 hours if needed 06/30/16   Asa Lente, MD  SPRINTEC 28 0.25-35 MG-MCG tablet  05/12/18   [provider]  valACYclovir (VALTREX) 1000 MG tablet 2000 mg every 12 hours for 2 doses. 06/17/16   Kuneff, Renee A, DO      Allergies    Patient has no known allergies.    Review of Systems   Review of Systems  Cardiovascular:  Positive for chest pain.    Physical Exam Updated Vital Signs BP 124/86   Pulse 76   Temp 98.5 F (36.9 C) (Oral)  Resp 20   Ht 5\' 11"  (1.803 m)   Wt 99.8 kg   LMP 01/05/2023   SpO2 99%   BMI 30.68 kg/m  Physical Exam Vitals and nursing note reviewed.  Constitutional:      General: She is not in acute distress.    Appearance: She is well-developed. She is not diaphoretic.  HENT:     Head: Normocephalic and atraumatic.  Eyes:     Pupils: Pupils are equal, round, and reactive to light.  Cardiovascular:     Rate and Rhythm: Normal rate and regular rhythm.     Heart sounds: No murmur heard.    No friction rub. No gallop.  Pulmonary:     Effort: Pulmonary effort is normal.     Breath sounds: No wheezing or rales.  Chest:     Chest wall: Tenderness present.     Comments: Mild soreness over the left anterior chest about the midclavicular line about ribs 2 and 3. Abdominal:     General: There is no  distension.     Palpations: Abdomen is soft.     Tenderness: There is no abdominal tenderness.  Musculoskeletal:        General: No tenderness.     Cervical back: Normal range of motion and neck supple.  Skin:    General: Skin is warm and dry.  Neurological:     Mental Status: She is alert and oriented to person, place, and time.  Psychiatric:        Behavior: Behavior normal.     ED Results / Procedures / Treatments   Labs (all labs ordered are listed, but only abnormal results are displayed) Labs Reviewed  BASIC METABOLIC PANEL - Abnormal; Notable for the following components:      Result Value   Glucose, Bld 172 (*)    All other components within normal limits  CBC  PREGNANCY, URINE  D-DIMER, QUANTITATIVE  TROPONIN I (HIGH SENSITIVITY)  TROPONIN I (HIGH SENSITIVITY)    EKG EKG Interpretation Date/Time:  Monday January 24 2023 19:13:16 EDT Ventricular Rate:  95 PR Interval:  168 QRS Duration:  80 QT Interval:  350 QTC Calculation: 439 R Axis:   29  Text Interpretation: Normal sinus rhythm Cannot rule out Anterior infarct , age undetermined Abnormal ECG No significant change since last tracing Confirmed by Melene Plan [phone]) on 01/24/2023 7:18:00 PM   EKG Interpretation Date/Time:  Monday January 24 2023 22:01:28 EDT Ventricular Rate:  80 PR Interval:  164 QRS Duration:  105 QT Interval:  399 QTC Calculation: 461 R Axis:   57  Text Interpretation: Sinus rhythm Since last tracing rate slower Otherwise no significant change Confirmed by Melene Plan [phone]) on 01/24/2023 10:52:47 PM        Radiology DG Chest Port 1 View  Result Date: 01/24/2023 CLINICAL DATA:  Chest pain.  Shortness of breath. EXAM: PORTABLE CHEST 1 VIEW COMPARISON:  12/25/2016 FINDINGS: The cardiomediastinal contours are normal. The lungs are clear. Pulmonary vasculature is normal. No consolidation, pleural effusion, or pneumothorax. No acute osseous abnormalities are seen. IMPRESSION: No  active disease. Electronically Signed   By: Narda Rutherford M.D.   On: 01/24/2023 20:53    Procedures Procedures    Medications Ordered in ED Medications - No data to display  ED Course/ Medical Decision Making/ A&P  Medical Decision Making Amount and/or Complexity of Data Reviewed Labs: ordered. Radiology: ordered. ECG/medicine tests: ordered.   42 yo F with a chief complaint of chest pain.  This started less than an hour ago and is almost completely resolved.  Atypical in nature pinpoint and partially reproduced on exam.  The patient unfortunately had a father having MI in his early 26s and is seeing a cardiologist.  They have a planned coronary CT coming up.  Will obtain a single troponin lab work chest x-ray.  Patient within the month traveled to and returned from [place].  Will obtain a D-dimer.  D-dimer is negative.  Troponin negative.  No anemia, no significant electrolyte abnormalities.  I discussed the case with Dr. Odis Hollingshead, cardiology.  Based on my history and patient's initial blood work thought to be less likely cardiac in nature.  He thought reasonable to discharge if second troponin was negative.  Repeat EKG with improved baseline, no obvious new ischemia.  10:53 PM:  I have discussed the diagnosis/risks/treatment options with the patient and family.  Evaluation and diagnostic testing in the emergency department does not suggest an emergent condition requiring admission or immediate intervention beyond what has been performed at this time.  They will follow up with Cards. We also discussed returning to the ED immediately if new or worsening sx occur. We discussed the sx which are most concerning (e.g., sudden worsening pain, fever, inability to tolerate by mouth) that necessitate immediate return. Medications administered to the patient during their visit and any new prescriptions provided to the patient are listed below.  Medications given  during this visit Medications - No data to display   The patient appears reasonably screen and/or stabilized for discharge and I doubt any other medical condition or other [facility] requiring further screening, evaluation, or treatment in the ED at this time prior to discharge.           Final Clinical Impression(s) / ED Diagnoses Final diagnoses:  Nonspecific chest pain    Rx / DC Orders ED Discharge Orders     None         Melene Plan, DO 01/24/23 2253

## 2023-01-25 NOTE — Telephone Encounter (Signed)
From patient.

## 2023-01-27 ENCOUNTER — Encounter (HOSPITAL_COMMUNITY): Payer: Self-pay

## 2023-01-31 ENCOUNTER — Ambulatory Visit (HOSPITAL_COMMUNITY)
Admission: RE | Admit: 2023-01-31 | Discharge: 2023-01-31 | Disposition: A | Discharge status: Home / Self Care | Payer: Managed Care, Other (non HMO) | Source: Ambulatory Visit | Attending: Cardiology | Admitting: Cardiology

## 2023-01-31 DIAGNOSIS — R9431 Abnormal electrocardiogram [ECG] [EKG]: Secondary | ICD-10-CM | POA: Insufficient documentation

## 2023-01-31 DIAGNOSIS — R0609 Other forms of dyspnea: Secondary | ICD-10-CM | POA: Insufficient documentation

## 2023-01-31 DIAGNOSIS — Z8249 Family history of ischemic heart disease and other diseases of the circulatory system: Secondary | ICD-10-CM | POA: Insufficient documentation

## 2023-01-31 MED ORDER — IOHEXOL 350 MG/ML SOLN
95.0000 mL | Freq: Once | INTRAVENOUS | Status: AC | PRN
  Administered 2023-01-31: 95 mL via INTRAVENOUS

## 2023-01-31 MED ORDER — NITROGLYCERIN 0.4 MG SL SUBL
SUBLINGUAL_TABLET | SUBLINGUAL | Status: AC
  Filled 2023-01-31: qty 2

## 2023-01-31 MED ORDER — NITROGLYCERIN 0.4 MG SL SUBL
0.8000 mg | SUBLINGUAL_TABLET | Freq: Once | SUBLINGUAL | Status: AC
  Administered 2023-01-31: 0.8 mg via SUBLINGUAL

## 2023-02-01 LAB — LIPID PANEL WITH LDL/HDL RATIO
Cholesterol, Total: 165 mg/dL (ref 100–199)
HDL: 77 mg/dL (ref >39)
LDL Chol Calc (NIH): 69 mg/dL (ref 0–99)
LDL/HDL Ratio: 0.9 ratio (ref 0.0–3.2)
Triglycerides: 107 mg/dL (ref 0–149)
VLDL Cholesterol Cal: 19 mg/dL (ref 5–40)

## 2023-02-02 NOTE — Progress Notes (Signed)
Coronary CT angiography 01/31/2023: Minimal mixed nonobstructive CAD.  Total Agatston coronary calcium score 50.3. MESA database percentile 99. RCA: Right dominant.  Normal. LM: Normal. LAD: Large vessel, wraps around the apex.  Proximal 1 to 24% mixed plaque stenosis.  Large hide D1 which bifurcates, normal. LCx: Small lateral LCx.  Normal.  Visualized ascending and descending aorta normal.. Extracardiac abnormalities: No significant extracardiac abnormality.

## 2023-02-09 ENCOUNTER — Ambulatory Visit (HOSPITAL_COMMUNITY): Payer: Managed Care, Other (non HMO) | Attending: Cardiology

## 2023-02-09 DIAGNOSIS — R9431 Abnormal electrocardiogram [ECG] [EKG]: Secondary | ICD-10-CM | POA: Diagnosis present

## 2023-02-09 DIAGNOSIS — R0609 Other forms of dyspnea: Secondary | ICD-10-CM | POA: Diagnosis present

## 2023-02-09 LAB — ECHOCARDIOGRAM COMPLETE
Area-P 1/2: 2.33 cm2
S' Lateral: 2.6 cm

## 2023-02-09 NOTE — Progress Notes (Signed)
Echocardiogram 02/09/2023: :    1. Left ventricular ejection fraction, by estimation, is 55 to 60%. Left ventricular ejection fraction by 3D volume is 55 %. The left ventricle has normal function. The left ventricle has no regional wall motion abnormalities. Left ventricular diastolic  parameters were normal. The average left ventricular global longitudinal strain is -18.6 %. The global longitudinal strain is normal.  2. Right ventricular systolic function is normal. The right ventricular size is normal. Tricuspid regurgitation signal is inadequate for assessing PA pressure.  3. The aortic valve is tricuspid. Aortic valve regurgitation is not visualized. No aortic stenosis is present.  4. Evidence of atrial level shunting detected by color flow Doppler (image 42 and 50) consider either a trival or small PFO. No subcostal images.  5. The mitral valve is normal in structure. No evidence of mitral valve regurgitation. No evidence of mitral stenosis.  6. Rhythm strip: NSR.

## 2023-02-11 ENCOUNTER — Ambulatory Visit: Payer: Medicare Other | Attending: Cardiology | Admitting: Cardiology

## 2023-02-14 ENCOUNTER — Encounter: Payer: Self-pay | Admitting: Cardiology

## 2023-02-18 ENCOUNTER — Telehealth: Payer: Self-pay | Admitting: Psychiatry

## 2023-02-18 ENCOUNTER — Other Ambulatory Visit (HOSPITAL_COMMUNITY): Payer: Managed Care, Other (non HMO)

## 2023-02-18 NOTE — Telephone Encounter (Signed)
It looks like patient was started on 6 mg Vraylar in February. I told her that Dr. Jennelle Human was out of the office today. She has enough 3 mg to get her to Wednesday. I told her that sometimes we have patients take medication every other day or every third day and I wanted to let Dr. Jennelle Human know and see if he wanted to dose that way or go back to 3 mg. Has FU 11/21.

## 2023-02-18 NOTE — Telephone Encounter (Signed)
Anita Black stating since being on Vraylar 6 mg she has trouble breathing. She had test and multiple scans taken. Since decreasing the Vraylar to 3 mg her breathing issues have stopped. A refill is requested for Vraylar 3 mg.  Appointment 03/31/23

## 2023-02-21 NOTE — Telephone Encounter (Signed)
Remind her she got manic on 3 mg Vraylar in Feb.   I'll send in the lower dose of 3 mg if she wants but it might be wise to try the 4.5 mg capsule.   The loss of benefit after reducing the Vraylar can easily take at least 3 mos.   Ask her if she wants to try the 4.5 mg or 3 mg dose.

## 2023-02-22 ENCOUNTER — Other Ambulatory Visit: Payer: Self-pay | Admitting: Psychiatry

## 2023-02-22 DIAGNOSIS — F4001 Agoraphobia with panic disorder: Secondary | ICD-10-CM

## 2023-02-22 MED ORDER — CARIPRAZINE HCL 4.5 MG PO CAPS: 1.0000 | ORAL_CAPSULE | Freq: Every day | ORAL | 0 refills | Status: DC

## 2023-02-22 NOTE — Telephone Encounter (Signed)
LVM to RC 

## 2023-02-22 NOTE — Telephone Encounter (Signed)
Patient willing to go down to 4.5 mg. Will stay at this dose as long as SE subside. Wants the option to go back to 3 mg if SE continue. Rx sent to requested pharmacy for 4.5 mg.

## 2023-03-31 ENCOUNTER — Ambulatory Visit: Payer: 59 | Admitting: Psychiatry

## 2023-03-31 ENCOUNTER — Encounter: Payer: Self-pay | Admitting: Psychiatry

## 2023-03-31 DIAGNOSIS — F3160 Bipolar disorder, current episode mixed, unspecified: Secondary | ICD-10-CM | POA: Diagnosis not present

## 2023-03-31 DIAGNOSIS — F5105 Insomnia due to other mental disorder: Secondary | ICD-10-CM

## 2023-03-31 DIAGNOSIS — F411 Generalized anxiety disorder: Secondary | ICD-10-CM

## 2023-03-31 DIAGNOSIS — F3112 Bipolar disorder, current episode manic without psychotic features, moderate: Secondary | ICD-10-CM

## 2023-03-31 DIAGNOSIS — Z79899 Other long term (current) drug therapy: Secondary | ICD-10-CM

## 2023-03-31 DIAGNOSIS — F4001 Agoraphobia with panic disorder: Secondary | ICD-10-CM | POA: Diagnosis not present

## 2023-03-31 MED ORDER — LITHIUM CARBONATE 300 MG PO CAPS
ORAL_CAPSULE | ORAL | 0 refills | Status: DC
Start: 2023-03-31 — End: 2023-08-15

## 2023-03-31 MED ORDER — ALPRAZOLAM 0.5 MG PO TABS
ORAL_TABLET | ORAL | 2 refills | Status: DC
Start: 2023-03-31 — End: 2023-08-05

## 2023-03-31 MED ORDER — CARIPRAZINE HCL 4.5 MG PO CAPS: 1.0000 | ORAL_CAPSULE | Freq: Every day | ORAL | 0 refills | Status: DC

## 2023-03-31 NOTE — Progress Notes (Signed)
Anita Black [id number] May 05, 1981 42 y.o.   Subjective:   Patient ID:  Anita Black is a 42 y.o. (DOB 04-01-1981) female.  Chief Complaint:  Chief Complaint  Patient presents with   Follow-up   Depression   Manic Behavior   Anxiety   Medication Reaction    Anxiety Symptoms include nervous/anxious behavior and shortness of breath. Patient reports no confusion, decreased concentration, nausea or suicidal ideas.    Depression        Associated symptoms include fatigue.  Associated symptoms include no decreased concentration and no suicidal ideas.  Past medical history includes anxiety.     Anita Black presents to the office today for follow-up of chronically unstable bipolar disorder and anxiety.  In March 26 , 2020.  She had been switched to [name] and had a good response.   When seen November 29, 2018 which she had reduced [name] on her own to 500 mg daily because of hair loss.  She was cautioned that this would probably not be enough medication to maintain mood stability but she did not want to make any medicine changes or increase the dose back up she was encouraged to take vitamins which often help with [name] related hair loss eventually.  visit December 12, 2018.  She had stopped [name] on her own due to hair loss and refused to consider taking vitamins to counteract this hair loss despite the fact that the [name] had been effective and she has had a very treatment resistant course with multiple med failures.  She asked about taking [name].  [name] was started at 5 mg a day for 1 week to increase then to 10 mg daily as a mood stabilizer.  She went up quicker.    It's been good.  Less irritable.  No SE.  Not manic.  Not much depression.  No change in chronic anxiety.  Sleep is the same and is OK.  Pretty level.  No worsenging mood sx with switch to [name] 10.  She's aware it is a low bipolar dose but satisfied.    seen January 10, 2019.  She had seen  improvement with [name] 10 mg and so no meds were changed. seen May 07, 2019.  She had stopped [name] 3-4 weeks later dT stomach pain which resolved off the meds.  Still taking [name] 120 daily, lamtrogine 200, lithium 900, [name] 60 daily.  We increased lamotrigine to 300 mg daily to try to help with ongoing mood instability.  07/04/19 appt with the followiing noted: No difference with increased lamotrigine to 300 mg.  Emotional outbursts easily triggered.  I got to do something about the outbursts.   Overall ok with a little moodiness.  Asks about increasing lamotrigine back to 200 BID.   Within last week hair loss stopped.   Anxiety still terrible but I still don't want to add anything else bc I'm on so much. Mostly worry vs physical sx. Ex convinced herself that F would get Covid and had to prepare for it.  No pattern to worry thoughts. No obsessions.  No panic attacks.  Sleep is okay.  Not profoundly depressed.  No hx drug abuse or alcohol abuse..    She reduced the lithium to 900 mg daily bc GI SE.  Somethings causing nausea again.  Higher dosage of lithium with less diarrhea manageable for right now.   Sleep OK. No manic SX.  Didn't realize how unstable she was until feeling stable. Plan: Reduce lamotrigine Back  to 200 mg daily.   She agrees to increase the lithium back to 1200 mg daily.  Wait 1 week then get lithium level BMP and TSH   07/10/19 TC with the following noted:  CO having to wean off [name] again DT cost. MD response:We have had to do this before and historically weaning her off [name] has led to more depression.  I would encourage her to investigate exactly when she meets deductible to ensure that that she wants to take this risk of worsening depression and mood swings. If she feels it necessary to wean off the [name] then cut the dosage in half to 60 mg for 2 weeks and then stop it.  If she has the opportunity to do 60 mg for 2 weeks and then 12:45 120 mg tablet for 2  additional weeks that would be ideal. If her symptoms worsen we could consider loxapine now that it is available again but it is typically not as effective for depression as is [name].  01/08/20 appt with the following noted: Stopped [name] after the samples.  BC high ded policy   Has been fine off the [name].  Patient reports stable mood and denies depressed moods.  Brief periods of sadness over difficulties with MS.  MS is fairly stable. Still some irritability but usually manageable.  Goes away on her own if feels she'll explode which she does at least 4 times weekly.  Same as on [name].  Patient with chronic manageable anxiety.  Patient denies difficulty with sleep initiation or maintenance. Denies appetite disturbance.  Patient reports that energy and motivation have been good.  Patient denies any difficulty with concentration.  Patient denies any suicidal ideation. Tolerating lithium without GI problems she had in the past. Plan: no med changes. She agrees to continue the lithium back to 1200 mg daily.  Labs good as noted lithium 0.8 on 1200.  04/02/2020 patient called for an urgent appointment today: She called on 03/20/2020 asking for something for anxiety.  Because she was on stimulant we gave her hydroxyzine 10 to 20 mg 3 times daily as needed. Says she doubted bipolar dx and now believes it. "I'm so manic".  Can't stop it.  I'm so mean and I can't stop it.  Tense inside and racing inside. Hydroxyzine didn't help. Recognized mania last week.  Felt high and euphoric.  Not crazy thoughts but impulsive and increased spending of [place]200 in arc[place] yesterday.  Is sleeping bc is exhausted.  Chronic MS fatigue.  Hyperactive.  More research online.  Hypomanic is her self dx.  Increased grandiosity of seeing things more clearly.  Impulsive and irritable alternating with euphoria. Can't stop herself. Been consistent with lithium.  Ran out of lamotrigine several weeks ago and forgot to pick it up.  Close  friend noticed the changes and saying she was talking fast. H doesn't believe in mental health problems. Plan: Disc dx mania which she had previously denied but now clearly recognizes.  She has a history of taking [name] with benefit but stopped it due to cost.  She believes her insurance will cover it now because she has a different policy.  We will start with 60 mg but increase to 120 mg very quickly because she is likely to need the higher dose to manage the mania. She agrees to continue the lithium back to 1200 mg daily.  Labs good as noted lithium 0.8 on 1200. For mania, start [name] 120 mg which she's taken before. Klonopin 1  mg TID until mania resolves  04/11/2020 appointment with the following noted: Seen urgently to follow-up recent mania.  Seen as a work in appointment. I feel much better. Mania more controlled but still feels it.  Arm had been numb from anxiety and it's better but not gone.  Not racing as much.  Mind is clearer.  Still a little distractible but less.  Still feels a little too upbeat. Extreme diarrhea and lost 8# in the week.  Only change was [name].  The first 4-5 nights was sedated but not last night. Doesn't like clonazepam bc very drowsy with it and only takes it when arm goes numb.  Taking only 1 every 2 days. She agrees to continue the lithium back to 1200 mg daily.  Labs good as noted lithium 0.8 on 1200. For mania, continue  [name] 120 mg which she's taken before.  It's helping. Start immodium prn  For diarrhea. Expect diarrhea will resolve as patient gets more used to the [name].  She started right out at a high dose which is unusual. Klonopin 1 mg TID prn  until mania resolves.  Good that she does not use much bc of [name].   04/16/20 TC's. Pt called and said that she is in the middle of a maniac state. She said that she is taking latuda and klonopin. However the klonopin is making her heart race and she is unable to rest. She has tried cutting it in half and  still the same. She would like something else called into her pharmacy so she can rest She recently switched back to [name] 120 mg which is so far not managing the mania.  She continues on lithium. The clonazepam is not making her heart race.  That is caused by the mania.  However I know she does not like the clonazepam so I will switch it to lorazepam 1 mg every 6 hours as needed agitation or insomnia. To better control the mania we will temporarily add haloperidol 5 mg nightly.  She is always concerned about weight gain and haloperidol does not have a significant risk of headache weight gain and is a good antimanic med.  Once the mania is controlled for a week or 2 then she can stop the haloperidol.   07/02/20 appt noted: Took Haldol 5 mg for a week and it calmed down and stopped it. It caused drowsiness. Manic sx resolved but still real irritable.  Insurance covering [place] so far.  Threw clonazepam away bc didn't seem to help.  Took lorazepam 1 mg and it seemed to have better calming effect.  Irritability in sudden bursts of 30 min and then resolves.  Cannot control it well.  Kind of weepy too.   EFA.  Sleep 7 hours.  Good function. On lithium 1200, [name] 120, [name] 60. Off lamotrigine since October.  GI rx colestipol and diarrhea stopped.  Took Ritalin 10 days without [name] and irritability without change. Tolerating medications  Plan: Add Luvox 50 mg HS for anxiety and irritability.  09/17/20 appt noted: Fluvoxamine for 3 weeks caused worsening runny nose and stopped.  Irritability got better and hasn't returned.  Mood has been good.  Anxiety is fine with nothing off the charts.  Sleep is good. SE only runny nose.  Diarrhea managed with colestipol. Plan: She agrees to continue the lithium back to 1200 mg daily.  08/13/20 lithium 1200= level 1.2 Diarrhea resolved with colestipol. Continue [name] 120  01/19/2021 appointment with the following noted: Doing good.  Pretty stable since  here.   CO weight gain eating at night to take it.  Too sleepy if takes with dinner. Not had Covid. CO lorazepam 1 mg prn anxiety not being effective.  Using it 3 times weekly. Plan: OK change BZ to infrequent use of Xanax 0.5-1 mg prn anxiety about 3 times per week. DC lorazepam BC ineffective.  05/19/2021 appt noted: Wants to change [name] bc nose running and no sex drive. Mood good without mania nor depression. No increased SE lithium and no tremors.   Not irritability nor agitation. Patient reports stable mood and denies depressed or irritable moods.  Patient denies any recent difficulty with anxiety.  Patient denies difficulty with sleep initiation or maintenance. Denies appetite disturbance.  Patient reports that energy and motivation have been good.  Patient denies any difficulty with concentration.  Patient denies any suicidal ideation. Plan: CO runny nose and SE [name] 120 mg daily.  Did ok without it briefly in past  Trial reduction [name] to 60 mg daily. If starts having mood sx start Vrayalar 1.5 mg and increase to 3 mg daily.  07/22/2021 appointment with the following noted: Less runny nose with less [name] at 60 mg daily and feels her personality.   Asks about reducing lithium. No diarrhea on [place]u but switched to low dose Ozempic. Mood stable .  Patient reports stable mood and denies depressed or irritable moods.  Patient denies any recent difficulty with anxiety.  Patient denies difficulty with sleep initiation or maintenance. Denies appetite disturbance.  Patient reports that energy and motivation have been good.  Patient denies any difficulty with concentration.  Patient denies any suicidal ideation. Plan: CO runny nose and SE [name] 120 mg daily.  Did ok without it briefly in past  Ok with reduction [name] to 60 mg daily. If starts having mood sx start Vrayalar 1.5 mg and increase to 3 mg daily.  08/28/2021 phone call with mixed manic symptoms.  With some thoughts of death  yes thank you with no suicidal plan. MD response: The most reliably effective strategy would be to increase the [name] back to 80 or more milligrams because we know that has worked before.  However she has complained of a runny nose and wanted the dose reduced.  Obviously the lower dose of [name] is not adequate.  As we discussed at the last appointment we can try Vraylar but there is no guarantee that it will work.  If she wants to try something unproven in order to try to avoid the runny nose side effect of [name], then she can pick up some samples of Vraylar 1.5 mg each morning for 1 week and then increase to 3 mg daily and continue the current dose of [name] for now.  09/07/21 appt noted:   Did not start Vraylar yet bc had sx. Calmed some now.  Was cycling from fine to cloudy and confused with racing thoughts.  Somewhat exhausted from the hypomania to normal ultra rapid cycling. Asked about dosing andn SE Vraylar and wanted to know before starting it. Burning bridges left and right last week. Plan: We will wean [name] over 10 days or so and start Vraylar 1.5 mg for 1 week and then increase to 3 mg daily.  09/28/2021 appointment with the following noted: LOving the Vraylar.  Clearer mind. Mood seems stable and out of the swing she was in. Irritability and depression resolved. SE none so far. Runny nose lessened but didn't resolve. Needs Xanax to go  to sleep bc anxious thoughts at night.  Then awakens 3 and restless thereafter. Goes to bed 930 and enough sleep. Energy chronically low with MS. Better interests with Vraylar bc dulled with [name] Plan: Trial clonidine off label for anxiety and agitation 0.1 mg  Continue Vraylar 3 mg daily New alprazolam 0.5 mg 3 times daily as needed anxiety, Continue [name] 60 mg daily for MS fatigue Continue lithium 1200 mg daily  10/27/2021 appointment with the following noted: Still loves the Vraylar and is much better with it.  Irritability is so much  better H notices. Still some anxiety but not extreme, usually. Last week had episode of driving anxiety and had to pull over but it's not always that bad. Takes Xanax in evening and not day bc gets groggy with it. Overall feels the anxiety is manageable though it is worse in the evening and she has to take Xanax in the evening.  However because the mood symptoms are so much more stable she feels like the anxiety is more manageable as well. SE mild restlessness You should put everybody on Vraylar.  More of her personality. Plan: no med changes  03/17/22 appt noted: I still think it is a miracle drug.  Some weeks thinks she should go up a dose bc can tell mild cycling but doesn't feel bad.  I can just tell.  But could n't tolerate morte restlessness which is persistent about the same but some days not noticeable but usually only 2-3 hours in the AM and then fine.  Much less irritable with Vraylar. Otherwise SE ok. Thinks may need something more for anxiety.  L arm goes numb with anxiety and did so in her 53s with anxiety and it makes her real scared.  Happens daily in the evening. Takes Xanax in PM when it happens and it lessens the sx. But needs to take 1 mg Xanax to help this. Would like something more to prevent this if possible. Plan: no med changes  06/10/22 TC: Patient said she is manic. She is only getting 1-2 hours of sleep a night, unable to get to sleep or stay asleep. Feels like she is losing her mind, risky behaviors, acting out of character, feels like she is crawling out of her skin.  Denies irritability, yelling. Denies depression, "happy as a clam." Anxiety is 8/10. No SI.  Meds:  Alprazolam - 1-2 TID, needs that much currently, doesn't need it daily  Propranolol - stopped it because she didn't feel it was helpful  Lithium 300 mg, 4 capsules at bedtime  Vraylar 3 mg qd - said it was a miracle drug until it wasn't.  Is requesting something for sleep in addition to needing adjustment  of meds.  MD response:  For mania and increase the Vraylar to 6 mg doubling up on the current 3 mg capsules and call us back next week and let us know how she is doing.  For sleep there is not a lot that I can give her that is compatible with the Xanax.  She can take Xanax up to 2 mg at night if it will work. Make sure she is taking 4 capsules of lithium every day and please get the lithium level as soon as possible.     07/22/22 appt noted: Taking Xanax 0.5 mg HS, Vraylar 6, lithium 1200 mg .  No propranolol bc didn't do anything.  I think I'm normal .  Is sleeping again 9-6..  With mania was worst thing  Had compulsive gambling $40K online and didn't care.  Never did it before.  Wasn't eating while manic and then started eating more.   Seems like a blur.   Also spending too much on things she didn't need.  Didn't realize she was manic until 2 weeks in.  Couldn't sleep.  When increased Vraylar gradually better for 3 weeks to baseline.  Best friend said she thought something was off.  Only lately told H she was bipolar.   F has dementia and multiple myeloma and living with them at 42 yo.   No SE.   Mood is still good without depression and mania.   Having a lot of anxiety but not panic.  Worry over wrecks or people dying.  Last few years anxiety over driving or being late.   Plan: no changes  09/29/22 appt noted: Less irritable and very good since increasing the Vraylar. No sig mania.  Depression under control. F passed away with normal grief.  No SE Meds as above.   Sleep good with Xanax.  Some awakening. Health is ok.  Started Ozempic which is constipating. No med changes  12/30/2022 appointment noted: Psych meds: Alprazolam 0.5 mg daily as needed anxiety, Vraylar 6 mg daily, [name] 60 mg a.m., lithium 1200 mg nightly, Have tremor but not taking propranolol. Still doing well on Vraylar "miracle drug".  No mania and no depression. On Ozempic 2 and lost 15 #.   No other sig SE Still some  anxiety worse at night.  Using CBT at night to help. Sleep is good taking Xanax without SE  02/18/23 reduced Vraylar from 6 to 4.5 bc SOB with exertion.  No CP.  Had cardiac workup was negative.  SOB got better after a couple of weeks.   03/31/23 appt noted: Psych meds: Alprazolam 0.5 mg daily as needed anxiety, Vraylar 6 mg daily, [name] 60 mg a.m., lithium 1200 mg nightly, Good I feel clear, even tempered, calm.  Doesn't fly off the handle.  Better self control.   Xanax at night and goes to sleep.  Awakens an hour.  Then back to sleep.  Getting 7 hours of sleep.     past psych meds:  [name] 1500 good resp but hair loss,, lithium 1200, CBZ, Equetro helped but GI side effects, Trileptal,  lamotrigine, [name] retrial 10 GI, Rexulti, Vraylar  [name] 120 helped but SE runny nose and low libido Seroquel low dosages with weight gain, Haldol 5 drowsy  Zoloft 150, Trintellix, , mirtazapine, Lexapro, Duloxetine side effects, long history of sertraline, Lexapro headaches, Trintellix,  doxazosin.   trazodone, mirtazapine,   Ambien too sedated Clonazepam less effective than lorazepam not as good as Xanax Remote high dose Xanax 2 mg.  Clonidine 0.1 mg NR and no SE  Adderall for MS fatigue, [name] for MS fatigue, armodafinil NR, Ritalin NR 20 BID without [name]  Review of Systems:  Review of Systems  Constitutional:  Positive for fatigue.  HENT:         Runny nose  Respiratory:  Positive for shortness of breath.   Gastrointestinal:  Negative for diarrhea, nausea and vomiting.  Skin:        Hair loss  Neurological:  Negative for tremors and weakness.       Balance problems  Psychiatric/Behavioral:  Negative for agitation, behavioral problems, confusion, decreased concentration, dysphoric mood, hallucinations, self-injury, sleep disturbance and suicidal ideas. The patient is nervous/anxious. The patient is not hyperactive.   Depression resolved  Medications: I have  reviewed the  patient's current medications.  Current Outpatient Medications  Medication Sig Dispense Refill   aspirin (ASPIRIN CHILDRENS) 81 MG chewable tablet Chew 1 tablet (81 mg total) by mouth daily.     atorvastatin (LIPITOR) 40 MG tablet Take 1 tablet (40 mg total) by mouth daily. 90 tablet 1   Cholecalciferol (VITAMIN D3) 10000 units TABS Take by mouth.     lisdexamfetamine ([name]) 60 MG capsule Take 1 capsule (60 mg total) by mouth every morning. 30 capsule 0   ocrelizumab (OCREVUS) 300 MG/10ML injection Inject into the vein.     OZEMPIC, 2 MG/DOSE, 8 MG/3ML SOPN Inject 2 mg into the skin once a week.     rizatriptan (MAXALT-MLT) 10 MG disintegrating tablet Take 1 tablet (10 mg total) by mouth as needed for migraine. May repeat in 2 hours if needed 9 tablet 11   SPRINTEC 28 0.25-35 MG-MCG tablet      valACYclovir (VALTREX) 1000 MG tablet 2000 mg every 12 hours for 2 doses. 8 tablet 3   ALPRAZolam (XANAX) 0.5 MG tablet TAKE 1 TABLET AT NIGHT AND 1 TABLET DAILY AS NEEDED FOR ANXIETY 45 tablet 2   Cariprazine HCl 4.5 MG CAPS Take 1 capsule (4.5 mg total) by mouth daily. 90 capsule 0   lithium carbonate 300 MG capsule TAKE 4 CAPSULES BY MOUTH AT BEDTIME 360 capsule 0   No current facility-administered medications for this visit.    Medication Side Effects: .diarrhea resolved.  [name] runny nose improved with dose reduction  Allergies: No Known Allergies  Past Medical History:  Diagnosis Date   Anxiety    Depression    Diabetes type 2, controlled (HCC)    Hyperlipidemia    Migraine    Multiple sclerosis (HCC)    receives Tysabri infusion q 28 days    Family History  Problem Relation Age of Onset   Diabetes Mother    Hypertension Mother    Dementia Mother    Bipolar disorder Mother    Heart attack Father 35   Heart failure Father    Hypertension Father    Heart disease Father    Stroke Father     Social History   Socioeconomic History   Marital status: Married    Spouse  name: Anita Black   Number of children: 1   Years of education: MA   Highest education level: Not on file  Occupational History    Employer: [employer]  Tobacco Use   Smoking status: Former    Current packs/day: 0.50    Average packs/day: 0.5 packs/day for 17.9 years (8.9 ttl pk-yrs)    Types: Cigarettes    Start date: 2007   Smokeless tobacco: Never  Vaping Use   Vaping status: Never Used  Substance and Sexual Activity   Alcohol use: Yes    Comment: socially; once a week   Drug use: No   Sexual activity: Yes    Birth control/protection: Pill  Other Topics Concern   Not on file  Social History Narrative   Patient lives at home with family.   Caffeine Use: 1-2 cups daily   No tobacco, recreational drugs. Social drinking only.   Wears her seatbelt, has smoke alarm in the home.   Feels safe in her relationships.   Social Determinants of Health   Financial Resource Strain: Not on file  Food Insecurity: No Food Insecurity (06/09/2021)   Received from [facility], [facility]   Hunger Vital Sign    Worried  About Running Out of Food in the Last Year: Never true    Ran Out of Food in the Last Year: Never true  Transportation Needs: Not on file  Physical Activity: Not on file  Stress: Not on file  Social Connections: Unknown (09/06/2021)   Received from [facility], [facility]   Social Network    Social Network: Not on file  Intimate Partner Violence: Unknown (08/12/2021)   Received from [facility], [facility]   HITS    Physically Hurt: Not on file    Insult or Talk Down To: Not on file    Threaten Physical Harm: Not on file    Scream or Curse: Not on file    Past Medical History, Surgical history, Social history, and Family history were reviewed and updated as appropriate.   Please see review of systems for further details on the patient's review from today.   Objective:   Physical Exam:  There were no vitals taken for this visit.  Physical  Exam Constitutional:      General: She is not in acute distress. Musculoskeletal:        General: No deformity.  Neurological:     Mental Status: She is alert and oriented to person, place, and time.     Cranial Nerves: No dysarthria.     Coordination: Coordination normal.  Psychiatric:        Attention and Perception: Attention and perception normal. She does not perceive auditory or visual hallucinations.        Mood and Affect: Mood is anxious. Mood is not depressed. Affect is not angry or tearful.        Speech: Speech is not rapid and pressured.        Behavior: Behavior is not agitated, aggressive or hyperactive. Behavior is cooperative.        Thought Content: Thought content normal. Thought content is not paranoid or delusional. Thought content does not include homicidal or suicidal ideation. Thought content does not include suicidal plan.        Cognition and Memory: Cognition and memory normal.        Judgment: Judgment normal.     Comments: Insight intact No mania      Lab Review:     Component Value Date/Time   NA 136 01/24/2023 1952   NA 137 04/15/2014 1554   K 3.9 01/24/2023 1952   CL 103 01/24/2023 1952   CO2 23 01/24/2023 1952   GLUCOSE 172 (H) 01/24/2023 1952   BUN 12 01/24/2023 1952   BUN 15 04/15/2014 1554   CREATININE 0.74 01/24/2023 1952   CREATININE 0.81 09/29/2022 1014   CALCIUM 9.0 01/24/2023 1952   PROT 6.2 (L) 10/22/2016 1009   PROT 6.9 12/29/2015 1400   ALBUMIN 3.3 (L) 10/22/2016 1009   ALBUMIN 4.5 12/29/2015 1400   AST 26 10/22/2016 1009   ALT 25 10/22/2016 1009   ALKPHOS 81 10/22/2016 1009   BILITOT 1.0 10/22/2016 1009   BILITOT 0.6 12/29/2015 1400   GFRNONAA >60 01/24/2023 1952   GFRAA >60 12/25/2016 1254       Component Value Date/Time   WBC 10.0 01/24/2023 1952   RBC 4.32 01/24/2023 1952   HGB 12.8 01/24/2023 1952   HGB 11.9 06/30/2016 1122   HCT 37.5 01/24/2023 1952   HCT 36.2 06/30/2016 1122   PLT 323 01/24/2023 1952   PLT  237 06/30/2016 1122   MCV 86.8 01/24/2023 1952   MCV 81 06/30/2016 1122  MCH 29.6 01/24/2023 1952   MCHC 34.1 01/24/2023 1952   RDW 12.7 01/24/2023 1952   RDW 14.6 06/30/2016 1122   LYMPHSABS 1.6 10/22/2016 1009   LYMPHSABS 3.6 (H) 06/30/2016 1122   MONOABS 0.3 10/22/2016 1009   EOSABS 0.1 10/22/2016 1009   EOSABS 0.1 06/30/2016 1122   BASOSABS 0.0 10/22/2016 1009   BASOSABS 0.0 06/30/2016 1122    Lithium Lvl  Date Value Ref Range Status  09/29/2022 0.8 0.6 - 1.2 mmol/L Final  09/29/22 lithium 0.8 on 1200 08/13/20 lithium 1200= level 1.2 No results found for: "PHENYTOIN", "PHENOBARB", "VALPROATE", "CBMZ"   .res Assessment: Plan:     Kenzli was seen today for follow-up, depression, manic behavior, anxiety and medication reaction.  Diagnoses and all orders for this visit:  Bipolar I disorder, most recent episode mixed (HCC) -     Lithium level -     Cariprazine HCl 4.5 MG CAPS; Take 1 capsule (4.5 mg total) by mouth daily. -     lithium carbonate 300 MG capsule; TAKE 4 CAPSULES BY MOUTH AT BEDTIME  Panic disorder with agoraphobia -     ALPRAZolam (XANAX) 0.5 MG tablet; TAKE 1 TABLET AT NIGHT AND 1 TABLET DAILY AS NEEDED FOR ANXIETY  Generalized anxiety disorder  Insomnia due to mental condition  Lithium use  Bipolar 1 disorder, manic, moderate (HCC) -     lithium carbonate 300 MG capsule; TAKE 4 CAPSULES BY MOUTH AT BEDTIME    30-minute face to face time with patient was spent on counseling and coordination of care. We discussed her chronically unstable bipolar disorder with a history of abrupt worsening of depression with suicidal ideation.  Most recently she has been manic. Has tended to be sensitive and have difficulty with various medications and hence has taken multiple medications as noted.   Overall mood sx were stable on [name] 120 mg daily but she complained of rhinorrhea which was persistent and the dose was gradually reduced to 60 mg daily at which point she  began to have rapid mixed cycling.  She does not want to go back up in [place] and wants to try something else.  She has not been on Vraylar before.  We discussed the side effects in detail.  Mania resolved on Vraylar 6 mg daily.  Based on her history she may have to go higher than that but we need to be cautious because of her history of intolerance of various medications.  Of note, the hx irritability is not due to [name] because she switched to Ritalin and the irritability was unchanged. [name] being taken from neuro for MS fatigue.  MS stable.  She agrees to continue the lithium 1200 mg daily.  Repeat lithium level. 5/24 =0.8 level Repeat sooner than 6 mos if loses 15# or more with ozempic.   Diarrhea resolved with colestipol.  She stopped this bc of other reasons but tolerating the lithium. Counseled patient regarding potential benefits, risks, and side effects of lithium to include potential risk of lithium affecting thyroid and renal function.  Discussed need for periodic lab monitoring to determine drug level and to assess for potential adverse effects.  Counseled patient regarding signs and symptoms of lithium toxicity and advised that they notify office immediately or seek urgent medical attention if experiencing these signs and symptoms.  Patient advised to contact office with any questions or concerns.  We discussed the short-term risks associated with benzodiazepines including sedation and increased fall risk among others.  Discussed  long-term side effect risk including dependence, potential withdrawal symptoms, and the potential eventual dose-related risk of dementia.  But recent studies from 2020 dispute this association between benzodiazepines and dementia risk. Newer studies in 2020 do not support an association with dementia.  Xanax 0.5-1 mg prn anxiety or sleep.  No med changes.  Continue : Alprazolam 0.5 mg daily as needed anxiety, Vraylar 4.5 mg daily, [name] 60 mg a.m.,  lithium 1200 mg nightly,  Her insomnia is managed fairly well.  Follow-up 3 mos  Meredith Staggers MD, DFAPA   Please see After Visit Summary for patient specific instructions.  No future appointments.      Orders Placed This Encounter  Procedures   Lithium level       -------------------------------

## 2023-05-12 ENCOUNTER — Other Ambulatory Visit: Payer: Self-pay | Admitting: Cardiology

## 2023-05-12 DIAGNOSIS — E7800 Pure hypercholesterolemia, unspecified: Secondary | ICD-10-CM

## 2023-06-07 ENCOUNTER — Ambulatory Visit: Payer: 59 | Admitting: Psychiatry

## 2023-07-05 ENCOUNTER — Other Ambulatory Visit (INDEPENDENT_AMBULATORY_CARE_PROVIDER_SITE_OTHER): Payer: Managed Care, Other (non HMO)

## 2023-07-05 ENCOUNTER — Encounter: Payer: Self-pay | Admitting: Physician Assistant

## 2023-07-05 ENCOUNTER — Ambulatory Visit: Payer: Medicare Other | Attending: Physician Assistant | Admitting: Physician Assistant

## 2023-07-05 VITALS — BP 98/70 | HR 95 | Ht 71.0 in | Wt 218.6 lb

## 2023-07-05 DIAGNOSIS — Z8249 Family history of ischemic heart disease and other diseases of the circulatory system: Secondary | ICD-10-CM | POA: Diagnosis not present

## 2023-07-05 DIAGNOSIS — E1165 Type 2 diabetes mellitus with hyperglycemia: Secondary | ICD-10-CM

## 2023-07-05 DIAGNOSIS — R9431 Abnormal electrocardiogram [ECG] [EKG]: Secondary | ICD-10-CM | POA: Diagnosis not present

## 2023-07-05 DIAGNOSIS — R002 Palpitations: Secondary | ICD-10-CM | POA: Diagnosis not present

## 2023-07-05 DIAGNOSIS — R0609 Other forms of dyspnea: Secondary | ICD-10-CM

## 2023-07-05 DIAGNOSIS — E7800 Pure hypercholesterolemia, unspecified: Secondary | ICD-10-CM

## 2023-07-05 MED ORDER — METOPROLOL TARTRATE 25 MG PO TABS
ORAL_TABLET | ORAL | 3 refills | Status: DC
Start: 2023-07-05 — End: 2023-12-26

## 2023-07-05 NOTE — Progress Notes (Unsigned)
 Applied a 14 day Zio AT monitor to patient in the office  Ganji to read

## 2023-07-05 NOTE — Progress Notes (Signed)
 Cardiology Office Note:  .   Date:  07/05/2023  ID:  Anita Black, DOB June 29, 1980, MRN [id number] PCP: Verl Blalock  [facility] Providers Cardiologist:  None {  History of Present Illness: Anita Black is a 43 y.o. female with a past medical history of multiple sclerosis being treated with twice RB, chronic migraine headaches, bipolar disorder, diabetes mellitus, hypercholesterolemia presenting for new onset dyspnea on exertion and was recently seen in September.  At that time, she noticed marked dyspnea doing even minimal activities to the point where she was afraid to do activities without having to stop as she feels very uncomfortable.  Denied chest pain, chest discomfort.  No other associate symptoms.  Stated that she was under stress when she took care of her dad recently passed on with multiple cardiac events hence initially thought that her dyspnea may be related to stress but even after a past she had not been able to do any other activities without having the feeling of marked dyspnea and she had to stop her activities since she was feeling very uncomfortable.  No orthopnea, palpitations, dizziness or syncope.  It looks like she then went to the ED on 01/24/2023 with complaint of chest pain which she described as left-sided and sharp lasting less than an hour.  Was feeling a little sore at that time as well.  Was eating at the onset and sitting at the table.  Felt some radiation to the left side of her neck.  Some shortness of breath on exertion as well which had been happening for about a month.  She ultimately underwent a coronary CTA which showed a calcium score of 50.3 which places the patient in 99 percentile compared to other folks that are her sex and age.  However, it was still considered to be noninvasive coronary artery disease so aggressive risk reduction was recommended.  The patient, with a history of multiple sclerosis, presents with recurrent  symptoms of shortness of breath and heart racing upon standing, which initially started in April of the previous year. The symptoms had subsided for a period but have recently returned. The patient describes the sensation as her heart racing and difficulty catching her breath, even with minimal exertion such as walking up stairs or from the parking lot. The patient also experiences blackouts and tunnel vision upon standing, especially from a lying position. The patient reports feeling extremely fatigued and often feels like she is going to pass out. The patient had previously seen a cardiologist and undergone several tests, including an EKG, CT scan, and ultrasound of the heart, which were abnormal but not significantly concerning. The patient also has a history of multiple sclerosis and reports frequent yawning, which she describes as an attempt to catch her breath.  Reports no chest pain, pressure, or tightness. No edema, orthopnea, PND.    Discussed the use of AI scribe software for clinical note transcription with the patient, who gave verbal consent to proceed.  ROS: Pertinent ROS in HPI  Studies Reviewed: [name]       Coronary CTA 01/31/2023 Coronary calcium score: The patient's coronary artery calcium score is 50.3, which places the patient in the 99th percentile (compared to a 43 yo cohort).   Coronary arteries: Normal coronary origins.  Right dominance.   Right Coronary Artery: Dominant. Normal artery. Normal R-PDA and R-PLB branches.   Left Main Coronary Artery: Normal. Bifurcates into the LAD and LCX arteries.   Left  Anterior Descending Coronary Artery: Large anterior artery that wraps around the apex with minimal 1-24% mixed proximal stenosis. Large high diagonal branch which bifurcates, no disease.   Left Circumflex Artery: Small lateral LCX artery, no disease. Most of the lateral wall is fed by the diagonal branch of the LAD.   Aorta: Normal size, 27 mm at the mid ascending  aorta (level of the PA bifurcation) measured double oblique. No calcifications. No dissection.   Aortic Valve: Trileaflet. No calcifications.   Other findings:   Normal pulmonary vein drainage into the left atrium.   Normal left atrial appendage without a thrombus.   Normal size of the pulmonary artery.   IMPRESSION: 1. Minimal mixed non-obstructive CAD, CADRADS = 1.   2. Coronary artery calcium score is 50.3, which places the patient in the 99th percentile compared to a 43 yo cohort.   3. Normal coronary origin with right dominance.   4. Aggressive cardiovascular risk factor modification is recommended given age-advanced CAD.      Physical Exam:   VS:  BP 98/70   Pulse 95   Ht 5\' 11"  (1.803 m)   Wt 218 lb 9.6 oz (99.2 kg)   SpO2 98%   BMI 30.49 kg/m    Wt Readings from Last 3 Encounters:  07/05/23 218 lb 9.6 oz (99.2 kg)  01/24/23 220 lb (99.8 kg)  01/20/23 228 lb (103.4 kg)    GEN: Well nourished, well developed in no acute distress NECK: No JVD; No carotid bruits CARDIAC: RRR, no murmurs, rubs, gallops RESPIRATORY:  Clear to auscultation without rales, wheezing or rhonchi  ABDOMEN: Soft, non-tender, non-distended EXTREMITIES:  No edema; No deformity   ASSESSMENT AND PLAN: .   DOE/Orthostatic symptoms and palpitations Symptoms of orthostatic hypotension and palpitations with exertion. Previous cardiac imaging showed no significant coronary disease. Possible supraventricular tachycardia (SVT) given symptoms and elevated resting heart rate. -Order 7-day live zio monitor to evaluate for arrhythmias. -Order carotid ultrasound to evaluate for carotid artery disease. -Prescribe Metoprolol 25mg  PRN for symptomatic relief. -Advise on hydration, avoidance of alcohol and caffeine, and consumption of salty snacks to increase blood pressure. -Consider pulmonary function tests through primary care provider to evaluate for possible lung disease contributing to shortness of  breath.  Follow-up Plan for prompt follow-up after completion of monitoring and ultrasound to discuss results and treatment options.       Signed, Sharlene Dory, PA-C

## 2023-07-05 NOTE — Patient Instructions (Signed)
 Medication Instructions:  Start Metoprolol Tartrate 25 mg as needed for palpitations   *If you need a refill on your cardiac medications before your next appointment, please call your pharmacy*   Lab Work:  If you have labs (blood work) drawn today and your tests are completely normal, you will receive your results only by: MyChart Message (if you have MyChart) OR A paper copy in the mail If you have any lab test that is abnormal or we need to change your treatment, we will call you to review the results.   Testing/Procedures: Your physician has requested that you have a carotid duplex. This test is an ultrasound of the carotid arteries in your neck. It looks at blood flow through these arteries that supply the brain with blood. Allow one hour for this exam. There are no restrictions or special instructions.  ZIO AT Long term monitor-Live Telemetry  Your physician has requested you wear a ZIO patch monitor for 14 days.  This is a single patch monitor. [employer] supplies one patch monitor per enrollment. Additional  stickers are not available.  Please do not apply patch if you will be having a Nuclear Stress Test, Echocardiogram, Cardiac CT, MRI,  or Chest Xray during the period you would be wearing the monitor. The patch cannot be worn during  these tests. You cannot remove and re-apply the ZIO AT patch monitor.  Your ZIO patch monitor will be mailed 3 day USPS to your address on file. It may take 3-5 days to  receive your monitor after you have been enrolled.  Once you have received your monitor, please review the enclosed instructions. Your monitor has  already been registered assigning a specific monitor serial # to you.   Billing and Patient Assistance Program information  Meredeth Ide has been supplied with any insurance information on record for billing. [employer] offers a sliding scale Patient Assistance Program for patients without insurance, or whose  insurance does not completely  cover the cost of the ZIO patch monitor. You must apply for the  Patient Assistance Program to qualify for the discounted rate. To apply, call [employer] at [phone],  select option 4, select option 2 , ask to apply for the Patient Assistance Program, (you can request an  interpreter if needed). [employer] will ask your household income and how many people are in your  household. [employer] will quote your out-of-pocket cost based on this information. They will also be able  to set up a 12 month interest free payment plan if needed.  Applying the monitor   Shave hair from upper left chest.  Hold the abrader disc by orange tab. Rub the abrader in 40 strokes over left upper chest as indicated in  your monitor instructions.  Clean area with 4 enclosed alcohol pads. Use all pads to ensure the area is cleaned thoroughly. Let  dry.  Apply patch as indicated in monitor instructions. Patch will be placed under collarbone on left side of  chest with arrow pointing upward.  Rub patch adhesive wings for 2 minutes. Remove the white label marked "1". Remove the white label  marked "2". Rub patch adhesive wings for 2 additional minutes.  While looking in a mirror, press and release button in center of patch. A small green light will flash 3-4  times. This will be your only indicator that the monitor has been turned on.  Do not shower for the first 24 hours. You may shower after the first 24 hours.  Press the  button if you feel a symptom. You will hear a small click. Record Date, Time and Symptom in  the Patient Log.   Starting the Gateway  In your kit there is a Audiological scientist box the size of a cellphone. This is Buyer, retail. It transmits all your  recorded data to [facility]. This box must always stay within 10 feet of you. Open the box and push the *  button. There will be a light that blinks orange and then green a few times. When the light stops  blinking, the Gateway is connected to the ZIO patch. Call  [employer] at [phone] to confirm your monitor is transmitting.  Returning your monitor  Remove your patch and place it inside the Gateway. In the lower half of the Gateway there is a white  bag with prepaid postage on it. Place Gateway in bag and seal. Mail package back to [place] as soon as  possible. Your physician should have your final report approximately 7 days after you have mailed back  your monitor. Call [facility] Customer Care at [phone] if you have questions regarding your ZIO AT  patch monitor. Call them immediately if you see an orange light blinking on your monitor.  If your monitor falls off in less than 4 days, contact our Monitor department at [phone]. If your  monitor becomes loose or falls off after 4 days call [employer] at [phone] for suggestions on  securing your monitor    Follow-Up: At [facility], you and your health needs are our priority.  As part of our continuing mission to provide you with exceptional heart care, we have created designated Provider Care Teams.  These Care Teams include your primary Cardiologist (physician) and Advanced Practice Providers (APPs -  Physician Assistants and Nurse Practitioners) who all work together to provide you with the care you need, when you need it.  We recommend signing up for the patient portal called "MyChart".  Sign up information is provided on this After Visit Summary.  MyChart is used to connect with patients for Virtual Visits (Telemedicine).  Patients are able to view lab/test results, encounter notes, upcoming appointments, etc.  Non-urgent messages can be sent to your provider as well.   To learn more about what you can do with MyChart, go to [email or web address].    Your next appointment:   3 week(s)  Provider:   Jari Favre, PA-C         Other Instructions

## 2023-07-07 ENCOUNTER — Other Ambulatory Visit: Payer: Self-pay | Admitting: Psychiatry

## 2023-07-07 DIAGNOSIS — F3160 Bipolar disorder, current episode mixed, unspecified: Secondary | ICD-10-CM

## 2023-07-26 ENCOUNTER — Ambulatory Visit (HOSPITAL_COMMUNITY)
Admission: RE | Admit: 2023-07-26 | Discharge: 2023-07-26 | Disposition: A | Discharge status: Home / Self Care | Payer: Medicare Other | Source: Ambulatory Visit | Attending: Internal Medicine | Admitting: Internal Medicine

## 2023-07-26 ENCOUNTER — Encounter: Payer: Self-pay | Admitting: Physician Assistant

## 2023-07-26 DIAGNOSIS — R9431 Abnormal electrocardiogram [ECG] [EKG]: Secondary | ICD-10-CM

## 2023-07-26 DIAGNOSIS — Z8249 Family history of ischemic heart disease and other diseases of the circulatory system: Secondary | ICD-10-CM

## 2023-07-26 DIAGNOSIS — R002 Palpitations: Secondary | ICD-10-CM | POA: Diagnosis present

## 2023-07-26 DIAGNOSIS — R0609 Other forms of dyspnea: Secondary | ICD-10-CM

## 2023-07-26 DIAGNOSIS — R42 Dizziness and giddiness: Secondary | ICD-10-CM

## 2023-07-26 DIAGNOSIS — E7800 Pure hypercholesterolemia, unspecified: Secondary | ICD-10-CM | POA: Diagnosis present

## 2023-07-26 DIAGNOSIS — E1165 Type 2 diabetes mellitus with hyperglycemia: Secondary | ICD-10-CM | POA: Diagnosis present

## 2023-07-28 NOTE — Progress Notes (Unsigned)
 Cardiology Office Note:  .   Date:  07/28/2023  ID:  Anita Black, DOB 1980-10-01, MRN [id number] PCP: Patient, No Pcp Per  [facility] Providers Cardiologist:  None {  History of Present Illness: Anita Black is a 43 y.o. female with a past medical history of multiple sclerosis being treated with twice RB, chronic migraine headaches, bipolar disorder, diabetes mellitus, hypercholesterolemia presenting for new onset dyspnea on exertion and was recently seen in September.  At that time, she noticed marked dyspnea doing even minimal activities to the point where she was afraid to do activities without having to stop as she feels very uncomfortable.  Denied chest pain, chest discomfort.  No other associate symptoms.  Stated that she was under stress when she took care of her dad recently passed on with multiple cardiac events hence initially thought that her dyspnea may be related to stress but even after a past she had not been able to do any other activities without having the feeling of marked dyspnea and she had to stop her activities since she was feeling very uncomfortable.  No orthopnea, palpitations, dizziness or syncope.  It looks like she then went to the ED on 01/24/2023 with complaint of chest pain which she described as left-sided and sharp lasting less than an hour.  Was feeling a little sore at that time as well.  Was eating at the onset and sitting at the table.  Felt some radiation to the left side of her neck.  Some shortness of breath on exertion as well which had been happening for about a month.  She ultimately underwent a coronary CTA which showed a calcium score of 50.3 which places the patient in 99 percentile compared to other folks that are her sex and age.  However, it was still considered to be noninvasive coronary artery disease so aggressive risk reduction was recommended.  07/05/23: The patient, with a history of multiple sclerosis, presents with  recurrent symptoms of shortness of breath and heart racing upon standing, which initially started in April of the previous year. The symptoms had subsided for a period but have recently returned. The patient describes the sensation as her heart racing and difficulty catching her breath, even with minimal exertion such as walking up stairs or from the parking lot. The patient also experiences blackouts and tunnel vision upon standing, especially from a lying position. The patient reports feeling extremely fatigued and often feels like she is going to pass out. The patient had previously seen a cardiologist and undergone several tests, including an EKG, CT scan, and ultrasound of the heart, which were abnormal but not significantly concerning. The patient also has a history of multiple sclerosis and reports frequent yawning, which she describes as an attempt to catch her breath.  Reports no chest pain, pressure, or tightness. No edema, orthopnea, PND.    Discussed the use of AI scribe software for clinical note transcription with the patient, who gave verbal consent to proceed.  ROS: Pertinent ROS in HPI  Studies Reviewed: [name]       Coronary CTA 01/31/2023 Coronary calcium score: The patient's coronary artery calcium score is 50.3, which places the patient in the 99th percentile (compared to a 43 yo cohort).   Coronary arteries: Normal coronary origins.  Right dominance.   Right Coronary Artery: Dominant. Normal artery. Normal R-PDA and R-PLB branches.   Left Main Coronary Artery: Normal. Bifurcates into the LAD and LCX arteries.  Left Anterior Descending Coronary Artery: Large anterior artery that wraps around the apex with minimal 1-24% mixed proximal stenosis. Large high diagonal branch which bifurcates, no disease.   Left Circumflex Artery: Small lateral LCX artery, no disease. Most of the lateral wall is fed by the diagonal branch of the LAD.   Aorta: Normal size, 27 mm at the mid  ascending aorta (level of the PA bifurcation) measured double oblique. No calcifications. No dissection.   Aortic Valve: Trileaflet. No calcifications.   Other findings:   Normal pulmonary vein drainage into the left atrium.   Normal left atrial appendage without a thrombus.   Normal size of the pulmonary artery.   IMPRESSION: 1. Minimal mixed non-obstructive CAD, CADRADS = 1.   2. Coronary artery calcium score is 50.3, which places the patient in the 99th percentile compared to a 43 yo cohort.   3. Normal coronary origin with right dominance.   4. Aggressive cardiovascular risk factor modification is recommended given age-advanced CAD.      Physical Exam:   VS:  There were no vitals taken for this visit.   Wt Readings from Last 3 Encounters:  07/05/23 218 lb 9.6 oz (99.2 kg)  01/24/23 220 lb (99.8 kg)  01/20/23 228 lb (103.4 kg)    GEN: Well nourished, well developed in no acute distress NECK: No JVD; No carotid bruits CARDIAC: RRR, no murmurs, rubs, gallops RESPIRATORY:  Clear to auscultation without rales, wheezing or rhonchi  ABDOMEN: Soft, non-tender, non-distended EXTREMITIES:  No edema; No deformity   ASSESSMENT AND PLAN: .   DOE/Orthostatic symptoms and palpitations Symptoms of orthostatic hypotension and palpitations with exertion. Previous cardiac imaging showed no significant coronary disease. Possible supraventricular tachycardia (SVT) given symptoms and elevated resting heart rate. -Order 7-day live zio monitor to evaluate for arrhythmias. -Order carotid ultrasound to evaluate for carotid artery disease. -Prescribe Metoprolol 25mg  PRN for symptomatic relief. -Advise on hydration, avoidance of alcohol and caffeine, and consumption of salty snacks to increase blood pressure. -Consider pulmonary function tests through primary care provider to evaluate for possible lung disease contributing to shortness of breath.  Follow-up Plan for prompt follow-up after  completion of monitoring and ultrasound to discuss results and treatment options.       Signed, Sharlene Dory, PA-C

## 2023-07-29 ENCOUNTER — Ambulatory Visit: Payer: Medicare Other | Attending: Physician Assistant | Admitting: Physician Assistant

## 2023-07-29 ENCOUNTER — Encounter: Payer: Self-pay | Admitting: Physician Assistant

## 2023-07-29 VITALS — BP 100/72 | HR 63 | Ht 71.0 in | Wt 219.2 lb

## 2023-07-29 DIAGNOSIS — R0609 Other forms of dyspnea: Secondary | ICD-10-CM | POA: Diagnosis not present

## 2023-07-29 DIAGNOSIS — R002 Palpitations: Secondary | ICD-10-CM | POA: Diagnosis not present

## 2023-07-29 DIAGNOSIS — I951 Orthostatic hypotension: Secondary | ICD-10-CM | POA: Diagnosis not present

## 2023-07-29 NOTE — Patient Instructions (Signed)
 Medication Instructions:  Your physician recommends that you continue on your current medications as directed. Please refer to the Current Medication list given to you today.  *If you need a refill on your cardiac medications before your next appointment, please call your pharmacy*   Lab Work: NONE If you have labs (blood work) drawn today and your tests are completely normal, you will receive your results only by: MyChart Message (if you have MyChart) OR A paper copy in the mail If you have any lab test that is abnormal or we need to change your treatment, we will call you to review the results.   Testing/Procedures: NONE   Follow-Up: At [facility], you and your health needs are our priority.  As part of our continuing mission to provide you with exceptional heart care, we have created designated Provider Care Teams.  These Care Teams include your primary Cardiologist (physician) and Advanced Practice Providers (APPs -  Physician Assistants and Nurse Practitioners) who all work together to provide you with the care you need, when you need it.  We recommend signing up for the patient portal called "MyChart".  Sign up information is provided on this After Visit Summary.  MyChart is used to connect with patients for Virtual Visits (Telemedicine).  Patients are able to view lab/test results, encounter notes, upcoming appointments, etc.  Non-urgent messages can be sent to your provider as well.   To learn more about what you can do with MyChart, go to [email or web address].    Your next appointment:   3-4 weeks  Provider:   APP  Other Instructions   1st Floor: - Lobby - Registration  - Pharmacy  - Lab - Cafe  2nd Floor: - PV Lab - Diagnostic Testing (echo, CT, nuclear med)  3rd Floor: - Vacant  4th Floor: - TCTS (cardiothoracic surgery) - AFib Clinic - Structural Heart Clinic - Vascular Surgery  - Vascular Ultrasound  5th Floor: - [facility]  (general and EP) - Clinical Pharmacy for coumadin, hypertension, lipid, weight-loss medications, and med management appointments    Valet parking services will be available as well.

## 2023-08-02 ENCOUNTER — Telehealth: Payer: Self-pay | Admitting: *Deleted

## 2023-08-02 ENCOUNTER — Encounter: Payer: Self-pay | Admitting: Physician Assistant

## 2023-08-02 MED ORDER — METOPROLOL SUCCINATE ER 25 MG PO TB24: 12.5000 mg | ORAL_TABLET | Freq: Every day | ORAL | 2 refills | Status: DC

## 2023-08-02 NOTE — Telephone Encounter (Signed)
-----   Message from Sharlene Dory sent at 08/02/2023  4:05 PM EDT ----- Probably due to her tachycardic response with standing which leads to SOB. Can we try daily metoprolol succinate 12.5mg  and have her track HR and BP?  Thanks! Sharlene Dory, PA-C ----- Message ----- From: Loa Socks, LPN Sent: 1/61/0960   2:49 PM EDT To: Sharlene Dory, PA-C  Pts systolic readings on average running 105-110, vision has improved with increased po water intake, but still some sob when standing. Please advise!  Thanks friend,  Armed forces training and education officer ----- Message ----- From: Sharlene Dory, PA-C Sent: 08/02/2023  10:20 AM EDT To: Loa Socks, LPN; Cv Div Ch St Triage  Your monitor showed predominantly normal sinus rhythm.  Minimal heart rate was 58 maximum was 129.  17 patient activated events that were all either normal sinus rhythm or sinus tachycardia with max heart rate of 113.  Rare extra beats on the bottom chamber of your heart.  Overall, no dangerous arrhythmias and no real clear picture on what is causing your symptoms.  How have you been feeling with a couple of things we discussed at your appointment? How has your BP been?  Thanks! Sharlene Dory, PA-C

## 2023-08-02 NOTE — Telephone Encounter (Signed)
 The patient has been notified of the result and verbalized understanding.  All questions (if any) were answered.  Pt aware that per Jari Favre PA-C, we will start her on Toprol XL 12.5 mg po daily and Tessa would like for her to track her HR/BP daily and periodically send [place] a couple readings through her mychart.  Confirmed the pharmacy of choice with the pt.    Pt verbalized understanding and agrees with this plan.

## 2023-08-04 ENCOUNTER — Other Ambulatory Visit: Payer: Self-pay | Admitting: Psychiatry

## 2023-08-04 DIAGNOSIS — F4001 Agoraphobia with panic disorder: Secondary | ICD-10-CM

## 2023-08-05 ENCOUNTER — Ambulatory Visit: Payer: Medicare Other | Admitting: Nurse Practitioner

## 2023-08-09 ENCOUNTER — Ambulatory Visit (INDEPENDENT_AMBULATORY_CARE_PROVIDER_SITE_OTHER): Payer: 59 | Admitting: Professional Counselor

## 2023-08-09 ENCOUNTER — Encounter: Payer: Self-pay | Admitting: Professional Counselor

## 2023-08-09 DIAGNOSIS — F411 Generalized anxiety disorder: Secondary | ICD-10-CM

## 2023-08-09 DIAGNOSIS — F3160 Bipolar disorder, current episode mixed, unspecified: Secondary | ICD-10-CM | POA: Diagnosis not present
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
## 2023-08-15 ENCOUNTER — Other Ambulatory Visit: Payer: Self-pay | Admitting: Psychiatry

## 2023-08-15 ENCOUNTER — Encounter: Payer: Self-pay | Admitting: Psychiatry

## 2023-08-15 ENCOUNTER — Ambulatory Visit (INDEPENDENT_AMBULATORY_CARE_PROVIDER_SITE_OTHER): Payer: 59 | Admitting: Psychiatry

## 2023-08-15 DIAGNOSIS — F4001 Agoraphobia with panic disorder: Secondary | ICD-10-CM

## 2023-08-15 DIAGNOSIS — F3160 Bipolar disorder, current episode mixed, unspecified: Secondary | ICD-10-CM

## 2023-08-15 DIAGNOSIS — F3112 Bipolar disorder, current episode manic without psychotic features, moderate: Secondary | ICD-10-CM

## 2023-08-15 MED ORDER — VRAYLAR 4.5 MG PO CAPS: 4.5000 mg | ORAL_CAPSULE | Freq: Every day | ORAL | 1 refills | Status: DC

## 2023-08-15 MED ORDER — ALPRAZOLAM 0.25 MG PO TABS: ORAL_TABLET | ORAL | 1 refills | Status: DC

## 2023-08-15 MED ORDER — LITHIUM CARBONATE 300 MG PO CAPS: ORAL_CAPSULE | ORAL | 0 refills | Status: DC

## 2023-08-15 NOTE — Progress Notes (Signed)
 Anita Black [id number] January 08, 1981 43 y.o.   Subjective:   Patient ID:  Anita Black is a 44 y.o. (DOB [birth year]) female.  Chief Complaint:  Chief Complaint  Patient presents with   Follow-up   Depression   Anxiety   Sleeping Problem    [name] presents to the office today for follow-up of chronically unstable bipolar disorder and anxiety.  In March 26 , 2020.  She had been switched to [name] and had a good response.   When seen November 29, 2018 which she had reduced [name] on her own to 500 mg daily because of hair loss.  She was cautioned that this would probably not be enough medication to maintain mood stability but she did not want to make any medicine changes or increase the dose back up she was encouraged to take vitamins which often help with [name] related hair loss eventually.  visit December 12, 2018.  She had stopped [name] on her own due to hair loss and refused to consider taking vitamins to counteract this hair loss despite the fact that the [name] had been effective and she has had a very treatment resistant course with multiple med failures.  She asked about taking [name].  [name] was started at 5 mg a day for 1 week to increase then to 10 mg daily as a mood stabilizer.  She went up quicker.    It's been good.  Less irritable.  No SE.  Not manic.  Not much depression.  No change in chronic anxiety.  Sleep is the same and is OK.  Pretty level.  No worsenging mood sx with switch to [name] 10.  She's aware it is a low bipolar dose but satisfied.    seen January 10, 2019.  She had seen improvement with [name] 10 mg and so no meds were changed. seen May 07, 2019.  She had stopped [name] 3-4 weeks later dT stomach pain which resolved off the meds.  Still taking [name] 120 daily, lamtrogine 200, lithium 900, [name] 60 daily.  We increased lamotrigine to 300 mg daily to try to help with ongoing mood instability.  07/04/19 appt with the followiing  noted: No difference with increased lamotrigine to 300 mg.  Emotional outbursts easily triggered.  I got to do something about the outbursts.   Overall ok with a little moodiness.  Asks about increasing lamotrigine back to 200 BID.   Within last week hair loss stopped.   Anxiety still terrible but I still don't want to add anything else bc I'm on so much. Mostly worry vs physical sx. Ex convinced herself that F would get Covid and had to prepare for it.  No pattern to worry thoughts. No obsessions.  No panic attacks.  Sleep is okay.  Not profoundly depressed.  No hx drug abuse or alcohol abuse..    She reduced the lithium to 900 mg daily bc GI SE.  Somethings causing nausea again.  Higher dosage of lithium with less diarrhea manageable for right now.   Sleep OK. No manic SX.  Didn't realize how unstable she was until feeling stable. Plan: Reduce lamotrigine Back to 200 mg daily.   She agrees to increase the lithium back to 1200 mg daily.  Wait 1 week then get lithium level BMP and TSH   07/10/19 TC with the following noted:  CO having to wean off [name] again DT cost. MD response:We have had to do this before and historically weaning her off  [name] has led to more depression.  I would encourage her to investigate exactly when she meets deductible to ensure that that she wants to take this risk of worsening depression and mood swings. If she feels it necessary to wean off the [name] then cut the dosage in half to 60 mg for 2 weeks and then stop it.  If she has the opportunity to do 60 mg for 2 weeks and then 12:45 120 mg tablet for 2 additional weeks that would be ideal. If her symptoms worsen we could consider loxapine now that it is available again but it is typically not as effective for depression as is [name].  01/08/20 appt with the following noted: Stopped [name] after the samples.  BC high ded policy   Has been fine off the [name].  Patient reports stable mood and denies depressed moods.   Brief periods of sadness over difficulties with MS.  MS is fairly stable. Still some irritability but usually manageable.  Goes away on her own if feels she'll explode which she does at least 4 times weekly.  Same as on [name].  Patient with chronic manageable anxiety.  Patient denies difficulty with sleep initiation or maintenance. Denies appetite disturbance.  Patient reports that energy and motivation have been good.  Patient denies any difficulty with concentration.  Patient denies any suicidal ideation. Tolerating lithium without GI problems she had in the past. Plan: no med changes. She agrees to continue the lithium back to 1200 mg daily.  Labs good as noted lithium 0.8 on 1200.  04/02/2020 patient called for an urgent appointment today: She called on 03/20/2020 asking for something for anxiety.  Because she was on stimulant we gave her hydroxyzine 10 to 20 mg 3 times daily as needed. Says she doubted bipolar dx and now believes it. "I'm so manic".  Can't stop it.  I'm so mean and I can't stop it.  Tense inside and racing inside. Hydroxyzine didn't help. Recognized mania last week.  Felt high and euphoric.  Not crazy thoughts but impulsive and increased spending of $200 in arc[place] yesterday.  Is sleeping bc is exhausted.  Chronic MS fatigue.  Hyperactive.  More research online.  Hypomanic is her self dx.  Increased grandiosity of seeing things more clearly.  Impulsive and irritable alternating with euphoria. Can't stop herself. Been consistent with lithium.  Ran out of lamotrigine several weeks ago and forgot to pick it up.  Close friend noticed the changes and saying she was talking fast. H doesn't believe in mental health problems. Plan: Disc dx mania which she had previously denied but now clearly recognizes.  She has a history of taking [name] with benefit but stopped it due to cost.  She believes her insurance will cover it now because she has a different policy.  We will start with 60 mg  but increase to 120 mg very quickly because she is likely to need the higher dose to manage the mania. She agrees to continue the lithium back to 1200 mg daily.  Labs good as noted lithium 0.8 on 1200. For mania, start [name] 120 mg which she's taken before. Klonopin 1 mg TID until mania resolves  04/11/2020 appointment with the following noted: Seen urgently to follow-up recent mania.  Seen as a work in appointment. I feel much better. Mania more controlled but still feels it.  Arm had been numb from anxiety and it's better but not gone.  Not racing as much.  Mind is clearer.  Still a little distractible but less.  Still feels a little too upbeat. Extreme diarrhea and lost 8# in the week.  Only change was [name].  The first 4-5 nights was sedated but not last night. Doesn't like clonazepam bc very drowsy with it and only takes it when arm goes numb.  Taking only 1 every 2 days. She agrees to continue the lithium back to 1200 mg daily.  Labs good as noted lithium 0.8 on 1200. For mania, continue  [name] 120 mg which she's taken before.  It's helping. Start immodium prn  For diarrhea. Expect diarrhea will resolve as patient gets more used to the [name].  She started right out at a high dose which is unusual. Klonopin 1 mg TID prn  until mania resolves.  Good that she does not use much bc of [name].   04/16/20 TC's. Pt called and said that she is in the middle of a maniac state. She said that she is taking latuda and klonopin. However the klonopin is making her heart race and she is unable to rest. She has tried cutting it in half and still the same. She would like something else called into her pharmacy so she can rest She recently switched back to [name] 120 mg which is so far not managing the mania.  She continues on lithium. The clonazepam is not making her heart race.  That is caused by the mania.  However I know she does not like the clonazepam so I will switch it to lorazepam 1 mg every 6  hours as needed agitation or insomnia. To better control the mania we will temporarily add haloperidol 5 mg nightly.  She is always concerned about weight gain and haloperidol does not have a significant risk of headache weight gain and is a good antimanic med.  Once the mania is controlled for a week or 2 then she can stop the haloperidol.   07/02/20 appt noted: Took Haldol 5 mg for a week and it calmed down and stopped it. It caused drowsiness. Manic sx resolved but still real irritable.  Insurance covering [place] so far.  Threw clonazepam away bc didn't seem to help.  Took lorazepam 1 mg and it seemed to have better calming effect.  Irritability in sudden bursts of 30 min and then resolves.  Cannot control it well.  Kind of weepy too.   EFA.  Sleep 7 hours.  Good function. On lithium 1200, [name] 120, [name] 60. Off lamotrigine since October.  GI rx colestipol and diarrhea stopped.  Took Ritalin 10 days without [name] and irritability without change. Tolerating medications  Plan: Add Luvox 50 mg HS for anxiety and irritability.  09/17/20 appt noted: Fluvoxamine for 3 weeks caused worsening runny nose and stopped.  Irritability got better and hasn't returned.  Mood has been good.  Anxiety is fine with nothing off the charts.  Sleep is good. SE only runny nose.  Diarrhea managed with colestipol. Plan: She agrees to continue the lithium back to 1200 mg daily.  08/13/20 lithium 1200= level 1.2 Diarrhea resolved with colestipol. Continue [name] 120  01/19/2021 appointment with the following noted: Doing good.  Pretty stable since here.   CO weight gain eating at night to take it.  Too sleepy if takes with dinner. Not had Covid. CO lorazepam 1 mg prn anxiety not being effective.  Using it 3 times weekly. Plan: OK change BZ to infrequent use of Xanax 0.5-1 mg prn anxiety about 3 times per week.  DC lorazepam BC ineffective.  05/19/2021 appt noted: Wants to change [name] bc nose running and no  sex drive. Mood good without mania nor depression. No increased SE lithium and no tremors.   Not irritability nor agitation. Patient reports stable mood and denies depressed or irritable moods.  Patient denies any recent difficulty with anxiety.  Patient denies difficulty with sleep initiation or maintenance. Denies appetite disturbance.  Patient reports that energy and motivation have been good.  Patient denies any difficulty with concentration.  Patient denies any suicidal ideation. Plan: CO runny nose and SE [name] 120 mg daily.  Did ok without it briefly in past  Trial reduction [name] to 60 mg daily. If starts having mood sx start Vrayalar 1.5 mg and increase to 3 mg daily.  07/22/2021 appointment with the following noted: Less runny nose with less [name] at 60 mg daily and feels her personality.   Asks about reducing lithium. No diarrhea on Monjoru but switched to low dose Ozempic. Mood stable .  Patient reports stable mood and denies depressed or irritable moods.  Patient denies any recent difficulty with anxiety.  Patient denies difficulty with sleep initiation or maintenance. Denies appetite disturbance.  Patient reports that energy and motivation have been good.  Patient denies any difficulty with concentration.  Patient denies any suicidal ideation. Plan: CO runny nose and SE [name] 120 mg daily.  Did ok without it briefly in past  Ok with reduction [name] to 60 mg daily. If starts having mood sx start Vrayalar 1.5 mg and increase to 3 mg daily.  08/28/2021 phone call with mixed manic symptoms.  With some thoughts of death yes thank you with no suicidal plan. MD response: The most reliably effective strategy would be to increase the [name] back to 80 or more milligrams because we know that has worked before.  However she has complained of a runny nose and wanted the dose reduced.  Obviously the lower dose of [name] is not adequate.  As we discussed at the last appointment we can try  Vraylar but there is no guarantee that it will work.  If she wants to try something unproven in order to try to avoid the runny nose side effect of [name], then she can pick up some samples of Vraylar 1.5 mg each morning for 1 week and then increase to 3 mg daily and continue the current dose of [name] for now.  09/07/21 appt noted:   Did not start Vraylar yet bc had sx. Calmed some now.  Was cycling from fine to cloudy and confused with racing thoughts.  Somewhat exhausted from the hypomania to normal ultra rapid cycling. Asked about dosing andn SE Vraylar and wanted to know before starting it. Burning bridges left and right last week. Plan: We will wean [name] over 10 days or so and start Vraylar 1.5 mg for 1 week and then increase to 3 mg daily.  09/28/2021 appointment with the following noted: LOving the Vraylar.  Clearer mind. Mood seems stable and out of the swing she was in. Irritability and depression resolved. SE none so far. Runny nose lessened but didn't resolve. Needs Xanax to go to sleep bc anxious thoughts at night.  Then awakens 3 and restless thereafter. Goes to bed 930 and enough sleep. Energy chronically low with MS. Better interests with Vraylar bc dulled with [name] Plan: Trial clonidine off label for anxiety and agitation 0.1 mg  Continue Vraylar 3 mg daily New alprazolam 0.5 mg 3 times daily  as needed anxiety, Continue [name] 60 mg daily for MS fatigue Continue lithium 1200 mg daily  10/27/2021 appointment with the following noted: Still loves the Vraylar and is much better with it.  Irritability is so much better H notices. Still some anxiety but not extreme, usually. Last week had episode of driving anxiety and had to pull over but it's not always that bad. Takes Xanax in evening and not day bc gets groggy with it. Overall feels the anxiety is manageable though it is worse in the evening and she has to take Xanax in the evening.  However because the mood symptoms  are so much more stable she feels like the anxiety is more manageable as well. SE mild restlessness You should put everybody on Vraylar.  More of her personality. Plan: no med changes  03/17/22 appt noted: I still think it is a miracle drug.  Some weeks thinks she should go up a dose bc can tell mild cycling but doesn't feel bad.  I can just tell.  But could n't tolerate morte restlessness which is persistent about the same but some days not noticeable but usually only 2-3 hours in the AM and then fine.  Much less irritable with Vraylar. Otherwise SE ok. Thinks may need something more for anxiety.  L arm goes numb with anxiety and did so in her 24s with anxiety and it makes her real scared.  Happens daily in the evening. Takes Xanax in PM when it happens and it lessens the sx. But needs to take 1 mg Xanax to help this. Would like something more to prevent this if possible. Plan: no med changes  06/10/22 TC: Patient said she is manic. She is only getting 1-2 hours of sleep a night, unable to get to sleep or stay asleep. Feels like she is losing her mind, risky behaviors, acting out of character, feels like she is crawling out of her skin.  Denies irritability, yelling. Denies depression, "happy as a clam." Anxiety is 8/10. No SI.  Meds:  Alprazolam - 1-2 TID, needs that much currently, doesn't need it daily  Propranolol - stopped it because she didn't feel it was helpful  Lithium 300 mg, 4 capsules at bedtime  Vraylar 3 mg qd - said it was a miracle drug until it wasn't.  Is requesting something for sleep in addition to needing adjustment of meds.  MD response:  For mania and increase the Vraylar to 6 mg doubling up on the current 3 mg capsules and call us back next week and let us know how she is doing.  For sleep there is not a lot that I can give her that is compatible with the Xanax.  She can take Xanax up to 2 mg at night if it will work. Make sure she is taking 4 capsules of lithium every day  and please get the lithium level as soon as possible.     07/22/22 appt noted: Taking Xanax 0.5 mg HS, Vraylar 6, lithium 1200 mg .  No propranolol bc didn't do anything.  I think I'm normal .  Is sleeping again 9-6..  With mania was worst thing Had compulsive gambling $40K online and didn't care.  Never did it before.  Wasn't eating while manic and then started eating more.   Seems like a blur.   Also spending too much on things she didn't need.  Didn't realize she was manic until 2 weeks in.  Couldn't sleep.  When increased Vraylar gradually  better for 3 weeks to baseline.  Best friend said she thought something was off.  Only lately told H she was bipolar.   F has dementia and multiple myeloma and living with them at 43 yo.   No SE.   Mood is still good without depression and mania.   Having a lot of anxiety but not panic.  Worry over wrecks or people dying.  Last few years anxiety over driving or being late.   Plan: no changes  09/29/22 appt noted: Less irritable and very good since increasing the Vraylar. No sig mania.  Depression under control. F passed away with normal grief.  No SE Meds as above.   Sleep good with Xanax.  Some awakening. Health is ok.  Started Ozempic which is constipating. No med changes  12/30/2022 appointment noted: Psych meds: Alprazolam 0.5 mg daily as needed anxiety, Vraylar 6 mg daily, [name] 60 mg a.m., lithium 1200 mg nightly, Have tremor but not taking propranolol. Still doing well on Vraylar "miracle drug".  No mania and no depression. On Ozempic 2 and lost 15 #.   No other sig SE Still some anxiety worse at night.  Using CBT at night to help. Sleep is good taking Xanax without SE  02/18/23 reduced Vraylar from 6 to 4.5 bc SOB with exertion.  No CP.  Had cardiac workup was negative.  SOB got better after a couple of weeks.   03/31/23 appt noted: Psych meds: Alprazolam 0.5 mg daily as needed anxiety, Vraylar 6 mg daily, [name] 60 mg a.m., lithium  1200 mg nightly, Good I feel clear, even tempered, calm.  Doesn't fly off the handle.  Better self control.   Xanax at night and goes to sleep.  Awakens an hour.  Then back to sleep.  Getting 7 hours of sleep.   Plan: No med changes.  Continue : Alprazolam 0.5 mg daily as needed anxiety, Vraylar 4.5 mg daily, [name] 60 mg a.m., lithium 1200 mg nightly, Check lithium level  08/15/23 appt noted: Med: as above  takes Xanax at night with mind racing controlled but not without Xanax. Mind races on world px at night.  If not taking the Xanax. Occ amnesia at night Forgot lithium level. I feel normal.  No med changes nor SE.  past psych meds:  [name] 1500 good resp but hair loss,, lithium 1200, CBZ, Equetro helped but GI side effects, Trileptal,  lamotrigine, [name] retrial 10 GI, Rexulti,  Vraylar  [name] 120 helped but SE runny nose and low libido Seroquel low dosages with weight gain, Haldol 5 drowsy  Zoloft 150, Trintellix, , mirtazapine, Lexapro, Duloxetine side effects, long history of sertraline, Lexapro headaches, Trintellix,  doxazosin.   trazodone, mirtazapine,   Ambien too sedated Clonazepam less effective than lorazepam not as good as Xanax Remote high dose Xanax 2 mg.  Clonidine 0.1 mg NR and no SE  Adderall for MS fatigue, [name] for MS fatigue, armodafinil NR, Ritalin NR 20 BID without [name]  Review of Systems:  Review of Systems  Constitutional:  Positive for fatigue.  HENT:         Runny nose  Respiratory:  Positive for shortness of breath.   Gastrointestinal:  Negative for diarrhea, nausea and vomiting.  Skin:        Hair loss  Neurological:  Negative for tremors and weakness.       Balance problems  Psychiatric/Behavioral:  Negative for agitation, behavioral problems, confusion, decreased concentration, dysphoric mood, hallucinations, self-injury, sleep disturbance  and suicidal ideas. The patient is nervous/anxious. The patient is not hyperactive.    Depression resolved  Medications: I have reviewed the patient's current medications.  Current Outpatient Medications  Medication Sig Dispense Refill   aspirin (ASPIRIN CHILDRENS) 81 MG chewable tablet Chew 1 tablet (81 mg total) by mouth daily.     atorvastatin (LIPITOR) 40 MG tablet TAKE 1 TABLET BY MOUTH EVERY DAY 90 tablet 2   azithromycin (ZITHROMAX) 250 MG tablet Take 250 mg by mouth as directed. Pt direct to take for 5 day.     ipratropium (ATROVENT) 0.03 % nasal spray Place 2 sprays into both nostrils 2 (two) times daily.     ketorolac (TORADOL) 15 MG/ML injection Inject 15 mg into the muscle every 8 (eight) hours as needed for moderate pain (pain score 4-6).     lisdexamfetamine ([name]) 60 MG capsule Take 1 capsule (60 mg total) by mouth every morning. 30 capsule 0   metoprolol succinate (TOPROL XL) 25 MG 24 hr tablet Take 0.5 tablets (12.5 mg total) by mouth daily. 45 tablet 2   metoprolol tartrate (LOPRESSOR) 25 MG tablet Take one tablet (25 mg) by mouth as needed for palpitations. 90 tablet 3   NURTEC 75 MG TBDP Take 1 tablet by mouth as needed.     ocrelizumab (OCREVUS) 300 MG/10ML injection Inject into the vein.     OZEMPIC, 2 MG/DOSE, 8 MG/3ML SOPN Inject 2 mg into the skin once a week.     promethazine (PHENERGAN) 25 MG tablet 25 mg every 8 (eight) hours as needed for nausea or vomiting.     rizatriptan (MAXALT-MLT) 10 MG disintegrating tablet Take 1 tablet (10 mg total) by mouth as needed for migraine. May repeat in 2 hours if needed 9 tablet 11   spironolactone (ALDACTONE) 100 MG tablet Take 100 mg by mouth daily.     SPRINTEC 28 0.25-35 MG-MCG tablet      valACYclovir (VALTREX) 1000 MG tablet 2000 mg every 12 hours for 2 doses. 8 tablet 3   zonisamide (ZONEGRAN) 50 MG capsule Take 50 mg by mouth daily.     ALPRAZolam (XANAX) 0.25 MG tablet TAKE 1 and 1/2 or 2 TABLET AT NIGHT AND 1 TABLET DAILY AS NEEDED FOR ANXIETY 75 tablet 1   Cariprazine HCl (VRAYLAR) 4.5 MG CAPS Take  1 capsule (4.5 mg total) by mouth daily at 12 noon. 90 capsule 1   lithium carbonate 300 MG capsule TAKE 4 CAPSULES BY MOUTH AT BEDTIME 360 capsule 0   No current facility-administered medications for this visit.    Medication Side Effects: .diarrhea resolved.  [name] runny nose improved with dose reduction  Allergies: No Known Allergies  Past Medical History:  Diagnosis Date   Anxiety    Depression    Diabetes type 2, controlled (HCC)    Hyperlipidemia    Migraine    Multiple sclerosis (HCC)    receives Tysabri infusion q 28 days    Family History  Problem Relation Age of Onset   Diabetes Mother    Hypertension Mother    Dementia Mother    Bipolar disorder Mother    Heart attack Father 88   Heart failure Father    Hypertension Father    Heart disease Father    Stroke Father     Social History   Socioeconomic History   Marital status: Married    Spouse name: Rollen Sox   Number of children: 1   Years of education: [place]  Highest education level: Not on file  Occupational History    Employer: [employer]  Tobacco Use   Smoking status: Former    Current packs/day: 0.50    Average packs/day: 0.5 packs/day for 18.3 years (9.1 ttl pk-yrs)    Types: Cigarettes    Start date: 2007   Smokeless tobacco: Never  Vaping Use   Vaping status: Never Used  Substance and Sexual Activity   Alcohol use: Yes    Comment: socially; once a week   Drug use: No   Sexual activity: Yes    Birth control/protection: Pill  Other Topics Concern   Not on file  Social History Narrative   Patient lives at home with family.   Caffeine Use: 1-2 cups daily   No tobacco, recreational drugs. Social drinking only.   Wears her seatbelt, has smoke alarm in the home.   Feels safe in her relationships.   Social Drivers of Corporate investment banker Strain: Not on file  Food Insecurity: No Food Insecurity (06/09/2021)   Received [facility], [facility]   Hunger Vital Sign     Worried About Running Out of Food in the Last Year: Never true    Ran Out of Food in the Last Year: Never true  Transportation Needs: Not on file  Physical Activity: Not on file  Stress: Not on file  Social Connections: Unknown (09/06/2021)   Received from [facility], [facility]   Social Network    Social Network: Not on file  Intimate Partner Violence: Unknown (08/12/2021)   Received from [facility], [facility]   HITS    Physically Hurt: Not on file    Insult or Talk Down To: Not on file    Threaten Physical Harm: Not on file    Scream or Curse: Not on file    Past Medical History, Surgical history, Social history, and Family history were reviewed and updated as appropriate.   Please see review of systems for further details on the patient's review from today.   Objective:   Physical Exam:  There were no vitals taken for this visit.  Physical Exam Constitutional:      General: She is not in acute distress. Musculoskeletal:        General: No deformity.  Neurological:     Mental Status: She is alert and oriented to person, place, and time.     Cranial Nerves: No dysarthria.     Coordination: Coordination normal.  Psychiatric:        Attention and Perception: Attention and perception normal. She does not perceive auditory or visual hallucinations.        Mood and Affect: Mood is anxious. Mood is not depressed. Affect is not angry or tearful.        Speech: Speech is not rapid and pressured.        Behavior: Behavior is not agitated, aggressive or hyperactive. Behavior is cooperative.        Thought Content: Thought content normal. Thought content is not paranoid or delusional. Thought content does not include homicidal or suicidal ideation. Thought content does not include suicidal plan.        Cognition and Memory: Cognition and memory normal.        Judgment: Judgment normal.     Comments: Insight intact No mania      Lab Review:     Component Value  Date/Time   NA 136 01/24/2023 1952   NA 137 04/15/2014 1554  K 3.9 01/24/2023 1952   CL 103 01/24/2023 1952   CO2 23 01/24/2023 1952   GLUCOSE 172 (H) 01/24/2023 1952   BUN 12 01/24/2023 1952   BUN 15 04/15/2014 1554   CREATININE 0.74 01/24/2023 1952   CREATININE 0.81 09/29/2022 1014   CALCIUM 9.0 01/24/2023 1952   PROT 6.2 (L) 10/22/2016 1009   PROT 6.9 12/29/2015 1400   ALBUMIN 3.3 (L) 10/22/2016 1009   ALBUMIN 4.5 12/29/2015 1400   AST 26 10/22/2016 1009   ALT 25 10/22/2016 1009   ALKPHOS 81 10/22/2016 1009   BILITOT 1.0 10/22/2016 1009   BILITOT 0.6 12/29/2015 1400   GFRNONAA >60 01/24/2023 1952   GFRAA >60 12/25/2016 1254       Component Value Date/Time   WBC 10.0 01/24/2023 1952   RBC 4.32 01/24/2023 1952   HGB 12.8 01/24/2023 1952   HGB 11.9 06/30/2016 1122   HCT 37.5 01/24/2023 1952   HCT 36.2 06/30/2016 1122   PLT 323 01/24/2023 1952   PLT 237 06/30/2016 1122   MCV 86.8 01/24/2023 1952   MCV 81 06/30/2016 1122   MCH 29.6 01/24/2023 1952   MCHC 34.1 01/24/2023 1952   RDW 12.7 01/24/2023 1952   RDW 14.6 06/30/2016 1122   LYMPHSABS 1.6 10/22/2016 1009   LYMPHSABS 3.6 (H) 06/30/2016 1122   MONOABS 0.3 10/22/2016 1009   EOSABS 0.1 10/22/2016 1009   EOSABS 0.1 06/30/2016 1122   BASOSABS 0.0 10/22/2016 1009   BASOSABS 0.0 06/30/2016 1122    Lithium Lvl  Date Value Ref Range Status  09/29/2022 0.8 0.6 - 1.2 mmol/L Final  09/29/22 lithium 0.8 on 1200 08/13/20 lithium 1200= level 1.2 No results found for: "PHENYTOIN", "PHENOBARB", "VALPROATE", "CBMZ"   .res Assessment: Plan:     Prim was seen today for follow-up, depression, anxiety and sleeping problem.  Diagnoses and all orders for this visit:  Panic disorder with agoraphobia -     ALPRAZolam (XANAX) 0.25 MG tablet; TAKE 1 and 1/2 or 2 TABLET AT NIGHT AND 1 TABLET DAILY AS NEEDED FOR ANXIETY  Bipolar I disorder, most recent episode mixed (HCC) -     Cariprazine HCl (VRAYLAR) 4.5 MG CAPS; Take 1  capsule (4.5 mg total) by mouth daily at 12 noon. -     lithium carbonate 300 MG capsule; TAKE 4 CAPSULES BY MOUTH AT BEDTIME  Bipolar 1 disorder, manic, moderate (HCC) -     lithium carbonate 300 MG capsule; TAKE 4 CAPSULES BY MOUTH AT BEDTIME     30-minute face to face time with patient was spent on counseling and coordination of care. We discussed her chronically unstable bipolar disorder with a history of abrupt worsening of depression with suicidal ideation.  Most recently she has been manic. Has tended to be sensitive and have difficulty with various medications and hence has taken multiple medications as noted.   Overall mood sx were stable on [name] 120 mg daily but she complained of rhinorrhea which was persistent and the dose was gradually reduced to 60 mg daily at which point she began to have rapid mixed cycling.  She does not want to go back up in the [place] and wanted to try something else.  She had not been on Vraylar before.  We discussed the side effects in detail. Mania resolved on Vraylar 6 mg daily.  Based on her history she may have to go higher than that but we need to be cautious because of her history of intolerance of various  medications.  Of note, the hx irritability is not due to [name] because she switched to Ritalin and the irritability was unchanged. [name] being taken from neuro for MS fatigue.  MS stable.  She agrees to continue the lithium 1200 mg daily.  Repeat lithium level. 5/24 =0.8 level Repeat sooner than 6 mos if loses 15# or more with ozempic.   Diarrhea resolved with colestipol.  She stopped this bc of other reasons but tolerating the lithium. Counseled patient regarding potential benefits, risks, and side effects of lithium to include potential risk of lithium affecting thyroid and renal function.  Discussed need for periodic lab monitoring to determine drug level and to assess for potential adverse effects.  Counseled patient regarding signs and  symptoms of lithium toxicity and advised that they notify office immediately or seek urgent medical attention if experiencing these signs and symptoms.  Patient advised to contact office with any questions or concerns.  We discussed the short-term risks associated with benzodiazepines including sedation and increased fall risk among others.  Discussed long-term side effect risk including dependence, potential withdrawal symptoms, and the potential eventual dose-related risk of dementia.  But recent studies from 2020 dispute this association between benzodiazepines and dementia risk. Newer studies in 2020 do not support an association with dementia.  No med changes.  Continue : Alprazolam 0.5 mg daily as needed anxiety, Vraylar 4.5 mg daily, [name] 60 mg a.m., lithium 1200 mg nightly,  Her insomnia is managed fairly well. Doesn't like Xanax and would rather alternative.  Disc Belsomra etc.  Or switch Bz.  Disc slow taper over couple mos if she wants. First try reducing Xanax 0.75 mg HS  Follow-up 4-5 mos  Meredith Staggers MD, DFAPA   Please see After Visit Summary for patient specific instructions.  Future Appointments  Date Time Provider Department Center  09/23/2023  9:15 AM Perlie Gold, PA-C [facility]  10/12/2023 11:00 AM Gaspar Bidding, [facility] None  10/26/2023 10:00 AM Gaspar Bidding, [facility] None  11/09/2023  9:00 AM Gaspar Bidding, [facility] None        No orders of the defined types were placed in this encounter.      -------------------------------

## 2023-09-08 ENCOUNTER — Ambulatory Visit (INDEPENDENT_AMBULATORY_CARE_PROVIDER_SITE_OTHER): Payer: Self-pay | Admitting: Professional Counselor

## 2023-09-08 DIAGNOSIS — Z0389 Encounter for observation for other suspected diseases and conditions ruled out: Secondary | ICD-10-CM

## 2023-09-08 NOTE — Progress Notes (Signed)
 Patient did not show for scheduled appointment. No message.  Bartolo Darter, [facility]

## 2023-09-23 ENCOUNTER — Ambulatory Visit: Admitting: Cardiology

## 2023-10-12 ENCOUNTER — Ambulatory Visit: Admitting: Professional Counselor

## 2023-10-13 ENCOUNTER — Ambulatory Visit: Attending: Nurse Practitioner | Admitting: Nurse Practitioner

## 2023-10-13 NOTE — Progress Notes (Deleted)
 Office Visit    Patient Name: Anita Black Date of Encounter: 10/13/2023  Primary Care Provider:  Patient, No Pcp Per Primary Cardiologist:  Knox Perl, MD  Chief Complaint    43 year old female with a history of elevated coronary artery calcium  score, dyspnea on exertion, sinus tachycardia, palpitations, hyperlipidemia, type 2 diabetes, multiple sclerosis, chronic migraine headaches, and bipolar disorder who presents for follow-up related to dyspnea on exertion.  Past Medical History    Past Medical History:  Diagnosis Date   Anxiety    Depression    Diabetes type 2, controlled (HCC)    Hyperlipidemia    Migraine    Multiple sclerosis (HCC)    receives Tysabri  infusion q 28 days   Past Surgical History:  Procedure Laterality Date   ABDOMINOPLASTY     CESAREAN SECTION     CHOLECYSTECTOMY  2001    Allergies  No Known Allergies   Labs/Other Studies Reviewed    The following studies were reviewed today:  Cardiac Studies & Procedures   ______________________________________________________________________________________________     ECHOCARDIOGRAM  ECHOCARDIOGRAM COMPLETE 02/09/2023  Narrative ECHOCARDIOGRAM REPORT    Patient Name:   PRAPTI GRUSSING Date of Exam: 02/09/2023 Medical Rec #:  [id number]        Height:       71.0 in Accession #:    [id number]       Weight:       220.0 lb Date of Birth:  03-25-1981        BSA:          2.196 m Patient Age:    42 years         BP:           120/80 mmHg Patient Gender: F                HR:           61 bpm. Exam Location:  [address]  Procedure: 2D Echo, Cardiac Doppler, Color Doppler, 3D Echo and Strain Analysis  Indications:    R06.00 SOB  History:        Patient has no prior history of Echocardiogram examinations. Abnormal ECG, Arrythmias:RBBB, Signs/Symptoms:Shortness of Breath; Risk Factors:Dyslipidemia, Diabetes, Former Smoker and Family History of Coronary Artery Disease.  Sonographer:     Lula Sale RDCS Referring Phys: [id number] Knox Perl   Sonographer Comments: No subcostal window. IMPRESSIONS   1. Left ventricular ejection fraction, by estimation, is 55 to 60%. Left ventricular ejection fraction by 3D volume is 55 %. The left ventricle has normal function. The left ventricle has no regional wall motion abnormalities. Left ventricular diastolic parameters were normal. The average left ventricular global longitudinal strain is -18.6 %. The global longitudinal strain is normal. 2. Right ventricular systolic function is normal. The right ventricular size is normal. Tricuspid regurgitation signal is inadequate for assessing PA pressure. 3. The aortic valve is tricuspid. Aortic valve regurgitation is not visualized. No aortic stenosis is present. 4. Evidence of atrial level shunting detected by color flow Doppler (image 42 and 50) consider either a trival or small PFO. No subcostal images. 5. The mitral valve is normal in structure. No evidence of mitral valve regurgitation. No evidence of mitral stenosis. 6. Rhythm strip during this exam demonstrates normal sinus rhythm.  Comparison(s): No prior Echocardiogram.  Conclusion(s)/Recommendation(s): Cannot exclude either a trivial or small PFO. Consider either limited echocardiogram with bubble study or transesophageal echocardiogram, if clinically indicated.  FINDINGS Left Ventricle: Left  ventricular ejection fraction, by estimation, is 55 to 60%. Left ventricular ejection fraction by 3D volume is 55 %. The left ventricle has normal function. The left ventricle has no regional wall motion abnormalities. The average left ventricular global longitudinal strain is -18.6 %. The global longitudinal strain is normal. The left ventricular internal cavity size was normal in size. There is no left ventricular hypertrophy. Left ventricular diastolic parameters were normal.  Right Ventricle: The right ventricular size is normal. No increase in  right ventricular wall thickness. Right ventricular systolic function is normal. Tricuspid regurgitation signal is inadequate for assessing PA pressure.  Left Atrium: Left atrial size was normal in size.  Right Atrium: Right atrial size was normal in size.  Pericardium: There is no evidence of pericardial effusion.  Mitral Valve: The mitral valve is normal in structure. No evidence of mitral valve regurgitation. No evidence of mitral valve stenosis.  Tricuspid Valve: The tricuspid valve is normal in structure. Tricuspid valve regurgitation is not demonstrated. No evidence of tricuspid stenosis.  Aortic Valve: The aortic valve is tricuspid. Aortic valve regurgitation is not visualized. No aortic stenosis is present.  Pulmonic Valve: The pulmonic valve was normal in structure. Pulmonic valve regurgitation is not visualized. No evidence of pulmonic stenosis.  Aorta: The aortic root and ascending aorta are structurally normal, with no evidence of dilitation.  Venous: The inferior vena cava was not well visualized.  IAS/Shunts: Evidence of atrial level shunting detected by color flow Doppler. Agitated saline contrast bubble study was negative, with no evidence of any interatrial shunt.  EKG: Rhythm strip during this exam demonstrates normal sinus rhythm.   LEFT VENTRICLE PLAX 2D LVIDd:         4.30 cm         Diastology LVIDs:         2.60 cm         LV e' medial:    7.29 cm/s LV PW:         1.20 cm         LV E/e' medial:  7.8 LV IVS:        0.90 cm         LV e' lateral:   9.68 cm/s LVOT diam:     2.00 cm         LV E/e' lateral: 5.9 LV SV:         49 LV SV Index:   22              2D LVOT Area:     3.14 cm        Longitudinal Strain 2D Strain GLS  18.7 % (A2C): 2D Strain GLS  16.0 % (A3C): 2D Strain GLS  21.1 % (A4C): 2D Strain GLS  -18.6 % Avg:  3D Volume EF LV 3D EF:    Left ventricul ar ejection fraction by 3D volume is 55 %.  3D Volume EF: 3D EF:        55  % LV EDV:       105 ml LV ESV:       48 ml LV SV:        58 ml  RIGHT VENTRICLE RV S prime:     11.20 cm/s TAPSE (M-mode): 1.8 cm  LEFT ATRIUM           Index        RIGHT ATRIUM           Index LA diam:  3.20 cm 1.46 cm/m   RA Pressure: 3.00 mmHg LA Vol (A2C): 29.3 ml 13.34 ml/m  RA Area:     8.20 cm LA Vol (A4C): 41.8 ml 19.04 ml/m  RA Volume:   15.30 ml  6.97 ml/m AORTIC VALVE LVOT Vmax:   89.90 cm/s LVOT Vmean:  56.800 cm/s LVOT VTI:    0.156 m  AORTA Ao Root diam: 3.10 cm Ao Asc diam:  3.00 cm  MITRAL VALVE               TRICUSPID VALVE MV Area (PHT): 2.33 cm    Estimated RAP:  3.00 mmHg MV Decel Time: 325 msec MV E velocity: 57.10 cm/s  SHUNTS MV A velocity: 53.50 cm/s  Systemic VTI:  0.16 m MV E/A ratio:  1.07        Systemic Diam: 2.00 cm  Sunit Tolia Electronically signed by Olinda Bertrand Signature Date/Time: 02/09/2023/1:31:36 PM    Final    MONITORS  LONG TERM MONITOR-LIVE TELEMETRY (3-14 DAYS) 07/29/2023  Narrative Images from the original result were not included. Zio patch extended EKG monitoring for 14 days starting 07/05/2023: Predominant rhythm was normal sinus rhythm.  Minimum heart rate 58 bpm at 9:08 AM and maximum heart rate of 129 bpm at 7:22 AM. There were 17 patient activated events all revealing normal sinus rhythm and sinus tachycardia with a maximum heart rate of 113 bpm. Rare PVCs were noted otherwise no other significant arrhythmias. Unremarkable monitoring.   CT SCANS  CT CORONARY MORPH W/CTA COR W/SCORE 01/31/2023  Addendum 02/19/2023  4:45 PM ADDENDUM REPORT: 02/19/2023 16:42  EXAM: OVER-READ INTERPRETATION  CT CHEST  The following report is an over-read performed by radiologist Dr. Chadwick Colonel of [facility] on 02/19/2023. This over-read does not include interpretation of cardiac or coronary anatomy or pathology. The coronary CTA interpretation by the cardiologist is attached.  COMPARISON:  No  prior chest CT.  FINDINGS: Vascular: No aortic atherosclerosis. The included aorta is normal in caliber.  Mediastinum/nodes: No adenopathy or mass. Unremarkable esophagus.  Lungs: No focal airspace disease. No pulmonary nodule. No pleural fluid. The included airways are patent.  Upper abdomen: No acute findings. Decreased hepatic density typical of steatosis.  Musculoskeletal: There are no acute or suspicious osseous abnormalities.  IMPRESSION: No unexpected intrathoracic findings. Incidental note of hepatic steatosis.   Electronically Signed By: Chadwick Colonel M.D. On: 02/19/2023 16:42  Narrative HISTORY: 43 yo female, ECG abnormal, high CAD risk  EXAM: Cardiac/Coronary CTA  TECHNIQUE: The patient was scanned on a [employer].  PROTOCOL: A 110 kV prospective scan was triggered in the descending thoracic aorta at 111 HU's. Axial non-contrast 3 mm slices were carried out through the heart. The data set was analyzed on a dedicated work station and scored using the Agatson method. Gantry rotation speed was 250 msecs and collimation was .6 mm. Beta blockade and 0.8 mg of sl NTG was given. The 3D data set was reconstructed in 5% intervals of the 35-75 % of the R-R cycle. Diastolic phases were analyzed on a dedicated work station using MPR, MIP and VRT modes. The patient received 95 ml omnipaque  contrast.  FINDINGS: Quality: Good, HR 71  Coronary calcium  score: The patient's coronary artery calcium  score is 50.3, which places the patient in the 99th percentile (compared to a 43 yo cohort).  Coronary arteries: Normal coronary origins.  Right dominance.  Right Coronary Artery: Dominant. Normal artery. Normal R-PDA and R-PLB branches.  Left Main  Coronary Artery: Normal. Bifurcates into the LAD and LCX arteries.  Left Anterior Descending Coronary Artery: Large anterior artery that wraps around the apex with minimal 1-24% mixed proximal stenosis. Large  high diagonal branch which bifurcates, no disease.  Left Circumflex Artery: Small lateral LCX artery, no disease. Most of the lateral wall is fed by the diagonal branch of the LAD.  Aorta: Normal size, 27 mm at the mid ascending aorta (level of the PA bifurcation) measured double oblique. No calcifications. No dissection.  Aortic Valve: Trileaflet. No calcifications.  Other findings:  Normal pulmonary vein drainage into the left atrium.  Normal left atrial appendage without a thrombus.  Normal size of the pulmonary artery.  IMPRESSION: 1. Minimal mixed non-obstructive CAD, CADRADS = 1.  2. Coronary artery calcium  score is 50.3, which places the patient in the 99th percentile compared to a 43 yo cohort.  3. Normal coronary origin with right dominance.  4. Aggressive cardiovascular risk factor modification is recommended given age-advanced CAD.  Electronically Signed: By: Hazle Lites M.D. On: 01/31/2023 18:34     ______________________________________________________________________________________________     Recent Labs: 01/24/2023: BUN 12; Creatinine, Ser 0.74; Hemoglobin 12.8; Platelets 323; Potassium 3.9; Sodium 136  Recent Lipid Panel    Component Value Date/Time   CHOL 165 01/31/2023 1431   TRIG 107 01/31/2023 1431   HDL 77 01/31/2023 1431   LDLCALC 69 01/31/2023 1431    History of Present Illness    43 year old female with the above past medical history including elevated coronary artery calcium  score, dyspnea on exertion, sinus tachycardia, palpitations, hyperlipidemia, type 2 diabetes, multiple sclerosis, chronic migraine headaches, and bipolar disorder.  She has a family history of heart disease in her father.  Neri CTA in 01/2023 in the setting of dyspnea on exertion revealed coronary calcium  score 50.3 (99 percentile).  Echocardiogram in 02/2023 showed EF 55 to 60%, normal LV function, no RWMA, normal RV systolic function, no significant valvular  abnormalities.  Carotid ultrasound in 07/2023 the setting of intermittent lightheadedness showed no evidence of carotid artery stenosis.  Monitor in 07/2023 in the setting of palpitations revealed commonly normal sinus rhythm, sinus tachycardia, rare PVCs, no significant arrhythmia. She was last seen in office on 07/29/2023 and reported intermittent lightheadedness, tunnel vision, palpitations, nausea, most notable with position changes.  Orthostatic vital signs were negative.  Supportive therapy was recommended for symptoms consistent with POTS.  She was started on metoprolol  succinate 12.5 mg daily.  She presents today for follow-up.  Since her last visit  Elevated coronary artery calcium  score/dyspnea on exertion: Sinus tachycardia/palpitations: Hyperlipidemia: Type 2 diabetes: Disposition:  Home Medications    Current Outpatient Medications  Medication Sig Dispense Refill   ALPRAZolam  (XANAX ) 0.25 MG tablet TAKE 1 and 1/2 or 2 TABLET AT NIGHT AND 1 TABLET DAILY AS NEEDED FOR ANXIETY 75 tablet 1   aspirin  (ASPIRIN  CHILDRENS) 81 MG chewable tablet Chew 1 tablet (81 mg total) by mouth daily.     atorvastatin  (LIPITOR) 40 MG tablet TAKE 1 TABLET BY MOUTH EVERY DAY 90 tablet 2   azithromycin  (ZITHROMAX ) 250 MG tablet Take 250 mg by mouth as directed. Pt direct to take for 5 day.     Cariprazine  HCl (VRAYLAR ) 4.5 MG CAPS Take 1 capsule (4.5 mg total) by mouth daily at 12 noon. 90 capsule 1   ipratropium (ATROVENT) 0.03 % nasal spray Place 2 sprays into both nostrils 2 (two) times daily.     ketorolac  (TORADOL ) 15 MG/ML injection Inject  15 mg into the muscle every 8 (eight) hours as needed for moderate pain (pain score 4-6).     lisdexamfetamine ([name] ) 60 MG capsule Take 1 capsule (60 mg total) by mouth every morning. 30 capsule 0   lithium  carbonate 300 MG capsule TAKE 4 CAPSULES BY MOUTH AT BEDTIME 360 capsule 0   metoprolol  succinate (TOPROL  XL) 25 MG 24 hr tablet Take 0.5 tablets (12.5 mg  total) by mouth daily. 45 tablet 2   metoprolol  tartrate (LOPRESSOR ) 25 MG tablet Take one tablet (25 mg) by mouth as needed for palpitations. 90 tablet 3   NURTEC 75 MG TBDP Take 1 tablet by mouth as needed.     ocrelizumab (OCREVUS) 300 MG/10ML injection Inject into the vein.     OZEMPIC, 2 MG/DOSE, 8 MG/3ML SOPN Inject 2 mg into the skin once a week.     promethazine  (PHENERGAN ) 25 MG tablet 25 mg every 8 (eight) hours as needed for nausea or vomiting.     rizatriptan  (MAXALT -MLT) 10 MG disintegrating tablet Take 1 tablet (10 mg total) by mouth as needed for migraine. May repeat in 2 hours if needed 9 tablet 11   spironolactone (ALDACTONE) 100 MG tablet Take 100 mg by mouth daily.     SPRINTEC 28 0.25-35 MG-MCG tablet      valACYclovir  (VALTREX ) 1000 MG tablet 2000 mg every 12 hours for 2 doses. 8 tablet 3   zonisamide (ZONEGRAN) 50 MG capsule Take 50 mg by mouth daily.     No current facility-administered medications for this visit.     Review of Systems    ***.  All other systems reviewed and are otherwise negative except as noted above.    Physical Exam    VS:  There were no vitals taken for this visit. , BMI There is no height or weight on file to calculate BMI.     GEN: Well nourished, well developed, in no acute distress. HEENT: normal. Neck: Supple, no JVD, carotid bruits, or masses. Cardiac: RRR, no murmurs, rubs, or gallops. No clubbing, cyanosis, edema.  Radials/DP/PT 2+ and equal bilaterally.  Respiratory:  Respirations regular and unlabored, clear to auscultation bilaterally. GI: Soft, nontender, nondistended, BS + x 4. MS: no deformity or atrophy. Skin: warm and dry, no rash. Neuro:  Strength and sensation are intact. Psych: Normal affect.  Accessory Clinical Findings    ECG personally reviewed by me today -    - no acute changes.   Lab Results  Component Value Date   WBC 10.0 01/24/2023   HGB 12.8 01/24/2023   HCT 37.5 01/24/2023   MCV 86.8 01/24/2023    PLT 323 01/24/2023   Lab Results  Component Value Date   CREATININE 0.74 01/24/2023   BUN 12 01/24/2023   NA 136 01/24/2023   K 3.9 01/24/2023   CL 103 01/24/2023   CO2 23 01/24/2023   Lab Results  Component Value Date   ALT 25 10/22/2016   AST 26 10/22/2016   ALKPHOS 81 10/22/2016   BILITOT 1.0 10/22/2016   Lab Results  Component Value Date   CHOL 165 01/31/2023   HDL 77 01/31/2023   LDLCALC 69 01/31/2023   TRIG 107 01/31/2023    Lab Results  Component Value Date   HGBA1C 6.5 09/30/2016    Assessment & Plan    1.  ***  No BP recorded.  {Refresh Note OR Click here to enter BP  :1}***   Jude Norton, NP 10/13/2023, 5:35  AM

## 2023-10-26 ENCOUNTER — Ambulatory Visit: Admitting: Professional Counselor

## 2023-11-09 ENCOUNTER — Ambulatory Visit: Admitting: Professional Counselor

## 2023-11-12 ENCOUNTER — Other Ambulatory Visit: Payer: Self-pay | Admitting: Psychiatry

## 2023-11-12 DIAGNOSIS — F3112 Bipolar disorder, current episode manic without psychotic features, moderate: Secondary | ICD-10-CM

## 2023-11-12 DIAGNOSIS — F3160 Bipolar disorder, current episode mixed, unspecified: Secondary | ICD-10-CM

## 2023-11-15 ENCOUNTER — Other Ambulatory Visit: Payer: Self-pay | Admitting: Psychiatry

## 2023-11-15 DIAGNOSIS — F4001 Agoraphobia with panic disorder: Secondary | ICD-10-CM

## 2023-12-26 ENCOUNTER — Ambulatory Visit: Attending: Cardiology | Admitting: Cardiology

## 2023-12-26 ENCOUNTER — Encounter: Payer: Self-pay | Admitting: Cardiology

## 2023-12-26 VITALS — BP 113/81 | Resp 16 | Ht 71.0 in | Wt 221.0 lb

## 2023-12-26 DIAGNOSIS — R0609 Other forms of dyspnea: Secondary | ICD-10-CM | POA: Diagnosis not present

## 2023-12-26 DIAGNOSIS — R002 Palpitations: Secondary | ICD-10-CM | POA: Diagnosis not present

## 2023-12-26 DIAGNOSIS — E7800 Pure hypercholesterolemia, unspecified: Secondary | ICD-10-CM | POA: Diagnosis not present

## 2023-12-26 DIAGNOSIS — E1165 Type 2 diabetes mellitus with hyperglycemia: Secondary | ICD-10-CM

## 2023-12-26 NOTE — Patient Instructions (Signed)
 Medication Instructions:  Your physician recommends that you continue on your current medications as directed. Please refer to the Current Medication list given to you today.  *If you need a refill on your cardiac medications before your next appointment, please call your pharmacy*  Lab Work: Have lab work done today in the lab on the first floor--Lipids and Lp(a) If you have labs (blood work) drawn today and your tests are completely normal, you will receive your results only by: MyChart Message (if you have MyChart) OR A paper copy in the mail If you have any lab test that is abnormal or we need to change your treatment, we will call you to review the results.  Testing/Procedures: none  Follow-Up: At [facility], you and your health needs are our priority.  As part of our continuing mission to provide you with exceptional heart care, our providers are all part of one team.  This team includes your primary Cardiologist (physician) and Advanced Practice Providers or APPs (Physician Assistants and Nurse Practitioners) who all work together to provide you with the care you need, when you need it.  Your next appointment:   As needed  Provider:   Gordy Bergamo, MD    We recommend signing up for the patient portal called MyChart.  Sign up information is provided on this After Visit Summary.  MyChart is used to connect with patients for Virtual Visits (Telemedicine).  Patients are able to view lab/test results, encounter notes, upcoming appointments, etc.  Non-urgent messages can be sent to your provider as well.   To learn more about what you can do with MyChart, go to [email or web address].   Other Instructions

## 2023-12-26 NOTE — Progress Notes (Signed)
 Cardiology Office Note:  .   Date:  12/26/2023  ID:  Anita Black, DOB 28-Jan-1981, MRN [id number] PCP: [facility]  [facility] Providers Cardiologist:  Gordy Bergamo, MD   History of Present Illness: .   Anita Black is a 43 y.o. Caucasian female patient with multiple sclerosis, chronic migraines, bipolar disorder, diabetes mellitus, autonomic insufficiency with orthostatic hypotension, hypercholesterolemia, family history of premature coronary disease in her father and also in her brother with MI at age 29, has coronary calcium  score in the 99th percentile by CTA on 01/31/2023 with no significant coronary artery disease presents for follow-up of palpitations, dizziness and dyspnea.  Cardiac Studies relevent.     LONG TERM MONITOR-LIVE TELEMETRY (3-14 DAYS) 07/29/2023  Zio patch extended EKG monitoring for 14 days starting 07/05/2023: Predominant rhythm was normal sinus rhythm.  Minimum heart rate 58 bpm at 9:08 AM and maximum heart rate of 129 bpm at 7:22 AM. There were 17 patient activated events all revealing normal sinus rhythm and sinus tachycardia with a maximum heart rate of 113 bpm. Rare PVCs were noted otherwise no other significant arrhythmias. Unremarkable monitoring.  Coronary CTA 01/31/2023: Total coronary calcium  score of 50.3, in the 99th percentile. Right dominant circulation.  No significant coronary artery disease.  Essentially normal with proximal LAD showing 1-24 percent mixed plaque.  Normal aorta.  Echocardiogram 02/09/2023:  Normal LVEF, normal diastolic function, possible small PFO.    Discussed the use of AI scribe software for clinical note transcription with the patient, who gave verbal consent to proceed.  History of Present Illness Anita Black is a 43 year old female with diabetes and hyperlipidemia who presents with shortness of breath and palpitations.  She experiences persistent shortness of breath that limits her  ability to walk and causes weakness. Her coronary CT angiogram in September 2024 shows a coronary calcium  score in the 99th percentile. She occasionally experiences dizziness when short of breath. No wheezing is present.  She experiences palpitations, described as a racing heart, particularly during exercise. A monitor in March recorded a heart rate of 113 beats per minute during these episodes without abnormal rhythms. She takes metoprolol  succinate 12.5 mg once daily for palpitations.  She manages diabetes with Jardiance and recently stopped Ozempic due to hair loss. She is interested in trying another GLP-1 agonist. Her LDL cholesterol has increased from 69 in September 2024 to 105, and she is on atorvastatin , which was recently increased.  She takes spironolactone 100 mg for cystic acne. She has experienced blood pressure drops upon standing.   Labs   Lab Results  Component Value Date   CHOL 165 01/31/2023   HDL 77 01/31/2023   LDLCALC 69 01/31/2023   TRIG 107 01/31/2023   No results found for: LIPOA  Recent Labs    01/24/23 1952  NA 136  K 3.9  CL 103  CO2 23  GLUCOSE 172*  BUN 12  CREATININE 0.74  CALCIUM  9.0  GFRNONAA >60    Lab Results  Component Value Date   ALT 25 10/22/2016   AST 26 10/22/2016   ALKPHOS 81 10/22/2016   BILITOT 1.0 10/22/2016      Latest Ref Rng & Units 01/24/2023    7:52 PM 12/25/2016   12:54 PM 10/22/2016   10:09 AM  CBC  WBC 4.0 - 10.5 K/uL 10.0  8.1  7.6   Hemoglobin 12.0 - 15.0 g/dL 87.1  87.9  89.6   Hematocrit 36.0 -  46.0 % 37.5  35.8  30.7   Platelets 150 - 400 K/uL 323  255  231    Lab Results  Component Value Date   HGBA1C 6.5 09/30/2016    Lab Results  Component Value Date   TSH 3.12 09/29/2022    [facility] labs 07/25/2023:  Total cholesterol 218, triglycerides 158, HDL 84, LDL 105  ROS  Review of Systems  Cardiovascular:  Positive for dyspnea on exertion and palpitations. Negative for chest pain, leg swelling and  paroxysmal nocturnal dyspnea.   Physical Exam:   VS:  BP 113/81 (BP Location: Left Arm, Patient Position: Sitting, Cuff Size: Large)   Resp 16   Ht 5' 11 (1.803 m)   Wt 221 lb (100.2 kg)   SpO2 98%   BMI 30.82 kg/m    Wt Readings from Last 3 Encounters:  12/26/23 221 lb (100.2 kg)  07/29/23 219 lb 3.2 oz (99.4 kg)  07/05/23 218 lb 9.6 oz (99.2 kg)    BP Readings from Last 3 Encounters:  12/26/23 113/81  07/29/23 100/72  07/05/23 98/70   Physical Exam Constitutional:      Appearance: She is obese.  Neck:     Vascular: No carotid bruit or JVD.  Cardiovascular:     Rate and Rhythm: Normal rate and regular rhythm.     Pulses: Intact distal pulses.     Heart sounds: Normal heart sounds. No murmur heard.    No gallop.  Pulmonary:     Effort: Pulmonary effort is normal.     Breath sounds: Normal breath sounds.  Abdominal:     General: Bowel sounds are normal.     Palpations: Abdomen is soft.  Musculoskeletal:     Right lower leg: No edema.     Left lower leg: No edema.    EKG:    EKG Interpretation Date/Time:  Monday December 26 2023 08:18:41 EDT Ventricular Rate:  94 PR Interval:  148 QRS Duration:  82 QT Interval:  358 QTC Calculation: 447 R Axis:   17  Text Interpretation: EKG 12/26/2023: Normal sinus rhythm at rate of 94 bpm, normal EKG.  Compared to 01/24/2023, no change. Confirmed by Eythan Jayne, Jagadeesh ([id number]) on 12/26/2023 8:29:58 AM    ASSESSMENT AND PLAN: .      ICD-10-CM   1. DOE (dyspnea on exertion)  R06.09 EKG 12-Lead    2. Palpitations  R00.2     3. Type 2 diabetes mellitus with hyperglycemia, without long-term current use of insulin (HCC)  E11.65     4. Pure hypercholesterolemia  E78.00 Lipid Profile    Lipoprotein A (LPA)     Assessment & Plan Shortness of breath and palpitations due to deconditioning Shortness of breath and palpitations are attributed to deconditioning rather than cardiac issues. Coronary CT angiogram from September 2024 showed  no plaque buildup causing blockages.  Echocardiogram suggested a possible PFO, but it is unlikely to be the cause of symptoms. Symptoms are likely due to lack of exercise, leading to increased heart rate during activity. No evidence of AFib or dangerous arrhythmias on heart monitor. Symptoms are life-altering but not life-threatening.  Patient will monitor her her exercise with pulse oximeter and only if she has significant desaturation of <90% then we could consider further evaluation of PFO for platypnea orthodeoxia syndrome - Encourage joining an exercise group for a year to improve conditioning. - Monitor oxygen saturation during exercise; report if it drops below 90%. - Continue metoprolol  succinate 12.5 mg once daily;  may stop if desired.  Type 2 diabetes mellitus Type 2 diabetes mellitus is currently managed with Jardiance. Previous use of Ozempic was discontinued due to hair loss. There is a need for aggressive management due to young age and associated cardiovascular risks. Consideration for GLP-1 agonist therapy to aid in weight loss and diabetes control. - Contact [facility] to set up an appointment for diabetes management. - Consider starting a GLP-1 agonist like Mounjaro for diabetes control and weight loss.  Did not tolerate Ozempic due to hair loss.  Hyperlipidemia Hyperlipidemia with recent increase in LDL from 69 to 105. Atorvastatin  dose was increased. High coronary calcium  score in the 99th percentile, necessitating aggressive lipid management to prevent future cardiovascular events. Triglycerides have also increased. - Order lipid profile and LPA test today. - Aim for LDL target closer to 55, ideally below 70, due to diabetic status and high coronary calcium  score. - Continue atorvastatin  and adjust based on lipid profile results.   Follow up: PRN  Signed,  Gordy Bergamo, MD, [facility] 12/26/2023, 7:36 PM [facility] [address] [place], [place]  [address] Phone: [phone]. Fax:  [phone]

## 2023-12-27 ENCOUNTER — Ambulatory Visit: Payer: Self-pay | Admitting: *Deleted

## 2023-12-27 LAB — LIPOPROTEIN A (LPA): Lipoprotein (a): <8.4 nmol/L (ref <75.0)

## 2023-12-27 LAB — LIPID PANEL
Chol/HDL Ratio: 2.8 ratio (ref 0.0–4.4)
Cholesterol, Total: 189 mg/dL (ref 100–199)
HDL: 67 mg/dL (ref >39)
LDL Chol Calc (NIH): 79 mg/dL (ref 0–99)
Triglycerides: 269 mg/dL — ABNORMAL HIGH (ref 0–149)
VLDL Cholesterol Cal: 43 mg/dL — ABNORMAL HIGH (ref 5–40)

## 2023-12-27 NOTE — Progress Notes (Signed)
 Your lipids are excellent, however I truly would like to get LDL to <70 preferably closer to 55 in view of your young age and diabetes status.  You are presently on 40 mg of Lipitor, could consider addition of Zetia 10 mg daily and recheck lipids in about 2 months with your PCP.  I truly believe that addition of Zetia will certainly bring your cholesterol to goal.  Your triglycerides are elevated due to diabetes.  Control of diabetes will improve triglycerides as well.  If you are willing to start Zetia, happy to send the Rx.

## 2024-01-17 ENCOUNTER — Ambulatory Visit: Admitting: Psychiatry

## 2024-01-25 ENCOUNTER — Other Ambulatory Visit: Payer: Self-pay | Admitting: Cardiology

## 2024-01-25 ENCOUNTER — Ambulatory Visit (INDEPENDENT_AMBULATORY_CARE_PROVIDER_SITE_OTHER): Payer: Self-pay | Admitting: Psychiatry

## 2024-01-25 DIAGNOSIS — Z91199 Patient's noncompliance with other medical treatment and regimen due to unspecified reason: Secondary | ICD-10-CM

## 2024-01-25 DIAGNOSIS — E7800 Pure hypercholesterolemia, unspecified: Secondary | ICD-10-CM

## 2024-01-25 NOTE — Progress Notes (Signed)
 No show

## 2024-02-14 ENCOUNTER — Other Ambulatory Visit: Payer: Self-pay | Admitting: Psychiatry

## 2024-02-14 DIAGNOSIS — F3160 Bipolar disorder, current episode mixed, unspecified: Secondary | ICD-10-CM

## 2024-02-20 ENCOUNTER — Ambulatory Visit: Admitting: Psychiatry

## 2024-02-20 ENCOUNTER — Encounter: Payer: Self-pay | Admitting: Psychiatry

## 2024-02-20 DIAGNOSIS — F3160 Bipolar disorder, current episode mixed, unspecified: Secondary | ICD-10-CM | POA: Diagnosis not present

## 2024-02-20 DIAGNOSIS — F411 Generalized anxiety disorder: Secondary | ICD-10-CM | POA: Diagnosis not present

## 2024-02-20 DIAGNOSIS — F902 Attention-deficit hyperactivity disorder, combined type: Secondary | ICD-10-CM

## 2024-02-20 DIAGNOSIS — F4001 Agoraphobia with panic disorder: Secondary | ICD-10-CM

## 2024-02-20 DIAGNOSIS — F5105 Insomnia due to other mental disorder: Secondary | ICD-10-CM

## 2024-02-20 DIAGNOSIS — Z79899 Other long term (current) drug therapy: Secondary | ICD-10-CM

## 2024-02-20 NOTE — Progress Notes (Signed)
 Anita Black [id number] [birth year] 43 y.o.   Subjective:   Patient ID:  Anita Black is a 43 y.o. (DOB 1981/02/05) female.  Chief Complaint:  Chief Complaint  Patient presents with   Follow-up   Depression   Anxiety   ADD   Manic Behavior    Anita Black presents to the office today for follow-up of chronically unstable bipolar disorder and anxiety.  In March 26 , 2020.  She had been switched to [name]  and had a good response.   When seen November 29, 2018 which she had reduced [name]  on her own to 500 mg daily because of hair loss.  She was cautioned that this would probably not be enough medication to maintain mood stability but she did not want to make any medicine changes or increase the dose back up she was encouraged to take vitamins which often help with [name]  related hair loss eventually.  visit December 12, 2018.  She had stopped [name]  on her own due to hair loss and refused to consider taking vitamins to counteract this hair loss despite the fact that the [name]  had been effective and she has had a very treatment resistant course with multiple med failures.  She asked about taking [name] .  [name]  was started at 5 mg a day for 1 week to increase then to 10 mg daily as a mood stabilizer.  She went up quicker.    It's been good.  Less irritable.  No SE.  Not manic.  Not much depression.  No change in chronic anxiety.  Sleep is the same and is OK.  Pretty level.  No worsenging mood sx with switch to [name]  10.  She's aware it is a low bipolar dose but satisfied.    seen January 10, 2019.  She had seen improvement with [name]  10 mg and so no meds were changed. seen May 07, 2019.  She had stopped [name]  3-4 weeks later dT stomach pain which resolved off the meds.  Still taking [name]  120 daily, lamtrogine 200, lithium  900, [name]  60 daily.  We increased lamotrigine  to 300 mg daily to try to help with ongoing mood instability.  07/04/19 appt with the  followiing noted: No difference with increased lamotrigine  to 300 mg.  Emotional outbursts easily triggered.  I got to do something about the outbursts.   Overall ok with a little moodiness.  Asks about increasing lamotrigine  back to 200 BID.   Within last week hair loss stopped.   Anxiety still terrible but I still don't want to add anything else bc I'm on so much. Mostly worry vs physical sx. Ex convinced herself that F would get Covid and had to prepare for it.  No pattern to worry thoughts. No obsessions.  No panic attacks.  Sleep is okay.  Not profoundly depressed.  No hx drug abuse or alcohol abuse..    She reduced the lithium  to 900 mg daily bc GI SE.  Somethings causing nausea again.  Higher dosage of lithium  with less diarrhea manageable for right now.   Sleep OK. No manic SX.  Didn't realize how unstable she was until feeling stable. Plan: Reduce lamotrigine  Back to 200 mg daily.   She agrees to increase the lithium  back to 1200 mg daily.  Wait 1 week then get lithium  level BMP and TSH   07/10/19 TC with the following noted:  CO having to wean off [name]  again DT cost. MD response:We have had to do this before and historically  weaning her off [name]  has led to more depression.  I would encourage her to investigate exactly when she meets deductible to ensure that that she wants to take this risk of worsening depression and mood swings. If she feels it necessary to wean off the [name]  then cut the dosage in half to 60 mg for 2 weeks and then stop it.  If she has the opportunity to do 60 mg for 2 weeks and then 12:45 120 mg tablet for 2 additional weeks that would be ideal. If her symptoms worsen we could consider loxapine now that it is available again but it is typically not as effective for depression as is [name] .  01/08/20 appt with the following noted: Stopped [name]  after the samples.  BC high ded policy   Has been fine off the [name] .  Patient reports stable mood and denies depressed  moods.  Brief periods of sadness over difficulties with MS.  MS is fairly stable. Still some irritability but usually manageable.  Goes away on her own if feels she'll explode which she does at least 4 times weekly.  Same as on [name] .  Patient with chronic manageable anxiety.  Patient denies difficulty with sleep initiation or maintenance. Denies appetite disturbance.  Patient reports that energy and motivation have been good.  Patient denies any difficulty with concentration.  Patient denies any suicidal ideation. Tolerating lithium  without GI problems she had in the past. Plan: no med changes. She agrees to continue the lithium  back to 1200 mg daily.  Labs good as noted lithium  0.8 on 1200.  04/02/2020 patient called for an urgent appointment today: She called on 03/20/2020 asking for something for anxiety.  Because she was on stimulant we gave her hydroxyzine  10 to 20 mg 3 times daily as needed. Says she doubted bipolar dx and now believes it. I'm so manic.  Can't stop it.  I'm so mean and I can't stop it.  Tense inside and racing inside. Hydroxyzine  didn't help. Recognized mania last week.  Felt high and euphoric.  Not crazy thoughts but impulsive and increased spending of [place]200 in [place] yesterday.  Is sleeping bc is exhausted.  Chronic MS fatigue.  Hyperactive.  More research online.  Hypomanic is her self dx.  Increased grandiosity of seeing things more clearly.  Impulsive and irritable alternating with euphoria. Can't stop herself. Been consistent with lithium .  Ran out of lamotrigine  several weeks ago and forgot to pick it up.  Close friend noticed the changes and saying she was talking fast. H doesn't believe in mental health problems. Plan: Disc dx mania which she had previously denied but now clearly recognizes.  She has a history of taking [name]  with benefit but stopped it due to cost.  She believes her insurance will cover it now because she has a different policy.  We will start with  60 mg but increase to 120 mg very quickly because she is likely to need the higher dose to manage the mania. She agrees to continue the lithium  back to 1200 mg daily.  Labs good as noted lithium  0.8 on 1200. For mania, start [name]  120 mg which she's taken before. Klonopin  1 mg TID until mania resolves  04/11/2020 appointment with the following noted: Seen urgently to follow-up recent mania.  Seen as a work in appointment. I feel much better. Mania more controlled but still feels it.  Arm had been numb from anxiety and it's better but not gone.  Not racing as much.  Mind  is clearer.  Still a little distractible but less.  Still feels a little too upbeat. Extreme diarrhea and lost 8# in the week.  Only change was [name] .  The first 4-5 nights was sedated but not last night. Doesn't like clonazepam  bc very drowsy with it and only takes it when arm goes numb.  Taking only 1 every 2 days. She agrees to continue the lithium  back to 1200 mg daily.  Labs good as noted lithium  0.8 on 1200. For mania, continue  [name]  120 mg which she's taken before.  It's helping. Start immodium prn  For diarrhea. Expect diarrhea will resolve as patient gets more used to the [name] .  She started right out at a high dose which is unusual. Klonopin  1 mg TID prn  until mania resolves.  Good that she does not use much bc of [name] .   04/16/20 TC's. Pt called and said that she is in the middle of a maniac state. She said that she is taking latuda  and klonopin . However the klonopin  is making her heart race and she is unable to rest. She has tried cutting it in half and still the same. She would like something else called into her pharmacy so she can rest She recently switched back to [name]  120 mg which is so far not managing the mania.  She continues on lithium . The clonazepam  is not making her heart race.  That is caused by the mania.  However I know she does not like the clonazepam  so I will switch it to lorazepam  1 mg  every 6 hours as needed agitation or insomnia. To better control the mania we will temporarily add haloperidol  5 mg nightly.  She is always concerned about weight gain and haloperidol  does not have a significant risk of headache weight gain and is a good antimanic med.  Once the mania is controlled for a week or 2 then she can stop the haloperidol .   07/02/20 appt noted: Took Haldol  5 mg for a week and it calmed down and stopped it. It caused drowsiness. Manic sx resolved but still real irritable.  Insurance covering [name]  so far.  Threw clonazepam  away bc didn't seem to help.  Took lorazepam  1 mg and it seemed to have better calming effect.  Irritability in sudden bursts of 30 min and then resolves.  Cannot control it well.  Kind of weepy too.   EFA.  Sleep 7 hours.  Good function. On lithium  1200, [name]  120, [name]  60. Off lamotrigine  since October.  GI rx colestipol and diarrhea stopped.  Took Ritalin 10 days without [name]  and irritability without change. Tolerating medications  Plan: Add Luvox  50 mg HS for anxiety and irritability.  09/17/20 appt noted: Fluvoxamine  for 3 weeks caused worsening runny nose and stopped.  Irritability got better and hasn't returned.  Mood has been good.  Anxiety is fine with nothing off the charts.  Sleep is good. SE only runny nose.  Diarrhea managed with colestipol. Plan: She agrees to continue the lithium  back to 1200 mg daily.  08/13/20 lithium  1200= level 1.2 Diarrhea resolved with colestipol. Continue [name]  120  01/19/2021 appointment with the following noted: Doing good.  Pretty stable since here.   CO weight gain eating at night to take it.  Too sleepy if takes with dinner. Not had Covid. CO lorazepam  1 mg prn anxiety not being effective.  Using it 3 times weekly. Plan: OK change BZ to infrequent use of Xanax  0.5-1 mg prn anxiety about 3  times per week. DC lorazepam  BC ineffective.  05/19/2021 appt noted: Wants to change [name]  bc nose  running and no sex drive. Mood good without mania nor depression. No increased SE lithium  and no tremors.   Not irritability nor agitation. Patient reports stable mood and denies depressed or irritable moods.  Patient denies any recent difficulty with anxiety.  Patient denies difficulty with sleep initiation or maintenance. Denies appetite disturbance.  Patient reports that energy and motivation have been good.  Patient denies any difficulty with concentration.  Patient denies any suicidal ideation. Plan: CO runny nose and SE [name]  120 mg daily.  Did ok without it briefly in past  Trial reduction [name]  to 60 mg daily. If starts having mood sx start Vrayalar 1.5 mg and increase to 3 mg daily.  07/22/2021 appointment with the following noted: Less runny nose with less [name]  at 60 mg daily and feels her personality.   Asks about reducing lithium . No diarrhea on Monjoru but switched to low dose Ozempic. Mood stable .  Patient reports stable mood and denies depressed or irritable moods.  Patient denies any recent difficulty with anxiety.  Patient denies difficulty with sleep initiation or maintenance. Denies appetite disturbance.  Patient reports that energy and motivation have been good.  Patient denies any difficulty with concentration.  Patient denies any suicidal ideation. Plan: CO runny nose and SE [name]  120 mg daily.  Did ok without it briefly in past  Ok with reduction [name]  to 60 mg daily. If starts having mood sx start Vrayalar 1.5 mg and increase to 3 mg daily.  08/28/2021 phone call with mixed manic symptoms.  With some thoughts of death yes thank you with no suicidal plan. MD response: The most reliably effective strategy would be to increase the [name]  back to 80 or more milligrams because we know that has worked before.  However she has complained of a runny nose and wanted the dose reduced.  Obviously the lower dose of [name]  is not adequate.  As we discussed at the last appointment  we can try Vraylar  but there is no guarantee that it will work.  If she wants to try something unproven in order to try to avoid the runny nose side effect of [name] , then she can pick up some samples of Vraylar  1.5 mg each morning for 1 week and then increase to 3 mg daily and continue the current dose of [name]  for now.  09/07/21 appt noted:   Did not start Vraylar  yet bc had sx. Calmed some now.  Was cycling from fine to cloudy and confused with racing thoughts.  Somewhat exhausted from the hypomania to normal ultra rapid cycling. Asked about dosing andn SE Vraylar  and wanted to know before starting it. Burning bridges left and right last week. Plan: We will wean [name]  over 10 days or so and start Vraylar  1.5 mg for 1 week and then increase to 3 mg daily.  09/28/2021 appointment with the following noted: LOving the Vraylar .  Clearer mind. Mood seems stable and out of the swing she was in. Irritability and depression resolved. SE none so far. Runny nose lessened but didn't resolve. Needs Xanax  to go to sleep bc anxious thoughts at night.  Then awakens 3 and restless thereafter. Goes to bed 930 and enough sleep. Energy chronically low with MS. Better interests with Vraylar  bc dulled with [name]  Plan: Trial clonidine  off label for anxiety and agitation 0.1 mg  Continue Vraylar  3 mg daily New alprazolam  0.5 mg  3 times daily as needed anxiety, Continue [name]  60 mg daily for MS fatigue Continue lithium  1200 mg daily  10/27/2021 appointment with the following noted: Still loves the Vraylar  and is much better with it.  Irritability is so much better H notices. Still some anxiety but not extreme, usually. Last week had episode of driving anxiety and had to pull over but it's not always that bad. Takes Xanax  in evening and not day bc gets groggy with it. Overall feels the anxiety is manageable though it is worse in the evening and she has to take Xanax  in the evening.  However because the mood  symptoms are so much more stable she feels like the anxiety is more manageable as well. SE mild restlessness You should put everybody on Vraylar .  More of her personality. Plan: no med changes  03/17/22 appt noted: I still think it is a miracle drug.  Some weeks thinks she should go up a dose bc can tell mild cycling but doesn't feel bad.  I can just tell.  But could n't tolerate morte restlessness which is persistent about the same but some days not noticeable but usually only 2-3 hours in the AM and then fine.  Much less irritable with Vraylar . Otherwise SE ok. Thinks may need something more for anxiety.  L arm goes numb with anxiety and did so in her 3s with anxiety and it makes her real scared.  Happens daily in the evening. Takes Xanax  in PM when it happens and it lessens the sx. But needs to take 1 mg Xanax  to help this. Would like something more to prevent this if possible. Plan: no med changes  06/10/22 TC: Patient said she is manic. She is only getting 1-2 hours of sleep a night, unable to get to sleep or stay asleep. Feels like she is losing her mind, risky behaviors, acting out of character, feels like she is crawling out of her skin.  Denies irritability, yelling. Denies depression, happy as a clam. Anxiety is 8/10. No SI.  Meds:  Alprazolam  - 1-2 TID, needs that much currently, doesn't need it daily  Propranolol  - stopped it because she didn't feel it was helpful  Lithium  300 mg, 4 capsules at bedtime  Vraylar  3 mg qd - said it was a miracle drug until it wasn't.  Is requesting something for sleep in addition to needing adjustment of meds.  MD response:  For mania and increase the Vraylar  to 6 mg doubling up on the current 3 mg capsules and call us  back next week and let us  know how she is doing.  For sleep there is not a lot that I can give her that is compatible with the Xanax .  She can take Xanax  up to 2 mg at night if it will work. Make sure she is taking 4 capsules of lithium   every day and please get the lithium  level as soon as possible.     07/22/22 appt noted: Taking Xanax  0.5 mg HS, Vraylar  6, lithium  1200 mg .  No propranolol  bc didn't do anything.  I think I'm normal .  Is sleeping again 9-6..  With mania was worst thing Had compulsive gambling $40K online and didn't care.  Never did it before.  Wasn't eating while manic and then started eating more.   Seems like a blur.   Also spending too much on things she didn't need.  Didn't realize she was manic until 2 weeks in.  Couldn't sleep.  When  increased Vraylar  gradually better for 3 weeks to baseline.  Best friend said she thought something was off.  Only lately told H she was bipolar.   F has dementia and multiple myeloma and living with them at 43 yo.   No SE.   Mood is still good without depression and mania.   Having a lot of anxiety but not panic.  Worry over wrecks or people dying.  Last few years anxiety over driving or being late.   Plan: no changes  09/29/22 appt noted: Less irritable and very good since increasing the Vraylar . No sig mania.  Depression under control. F passed away with normal grief.  No SE Meds as above.   Sleep good with Xanax .  Some awakening. Health is ok.  Started Ozempic which is constipating. No med changes  12/30/2022 appointment noted: Psych meds: Alprazolam  0.5 mg daily as needed anxiety, Vraylar  6 mg daily, [name]  60 mg a.m., lithium  1200 mg nightly, Have tremor but not taking propranolol . Still doing well on Vraylar  miracle drug.  No mania and no depression. On Ozempic 2 and lost 15 #.   No other sig SE Still some anxiety worse at night.  Using CBT at night to help. Sleep is good taking Xanax  without SE  02/18/23 reduced Vraylar  from 6 to 4.5 bc SOB with exertion.  No CP.  Had cardiac workup was negative.  SOB got better after a couple of weeks.   03/31/23 appt noted: Psych meds: Alprazolam  0.5 mg daily as needed anxiety, Vraylar  6 mg daily, [name]  60 mg a.m.,  lithium  1200 mg nightly, Good I feel clear, even tempered, calm.  Doesn't fly off the handle.  Better self control.   Xanax  at night and goes to sleep.  Awakens an hour.  Then back to sleep.  Getting 7 hours of sleep.   Plan: No med changes.  Continue : Alprazolam  0.5 mg daily as needed anxiety, Vraylar  4.5 mg daily, [name]  60 mg a.m., lithium  1200 mg nightly, Check lithium  level  08/15/23 appt noted: Med: as above  takes Xanax  at night with mind racing controlled but not without Xanax . Mind races on world px at night.  If not taking the Xanax . Occ amnesia at night Forgot lithium  level. I feel normal.  No med changes nor SE.  02/20/24 appt noted: Psych meds: Alprazolam  0.5 mg daily as needed anxiety, Vraylar  6 mg daily, [name]  60 mg a.m., lithium  1200 mg nightly, Less irritable.  No flashes anger like before.  Mood more stable. Xanax  a couple of times per week. Still some racing thoughts at night and causes some anxiety.  Not much daytime.   Some tolerance [name]  without SE.  But still focus benefit. No SE other than can't cry No health px now but did have flare MS and better now.  Still in MS treatment.    past psych meds:  [name]  1500 good resp but hair loss,, lithium  1200, CBZ, Equetro  helped but GI side effects, Trileptal ,  lamotrigine , [name]  retrial 10 GI, Rexulti,  Vraylar   [name]  120 helped but SE runny nose and low libido Seroquel low dosages with weight gain, Haldol  5 drowsy  Zoloft 150, Trintellix , , mirtazapine, Lexapro, Duloxetine side effects, long history of sertraline, Lexapro headaches, Trintellix ,  doxazosin.   trazodone, mirtazapine,   Ambien too sedated Clonazepam  less effective than lorazepam  not as good as Xanax  Remote high dose Xanax  2 mg.  Clonidine  0.1 mg NR and no SE  Adderall  for MS fatigue, [name]  for  MS fatigue, armodafinil NR, Ritalin NR 20 BID without [name]   Review of Systems:  Review of Systems  Constitutional:  Positive for fatigue.   HENT:         Runny nose  Respiratory:  Positive for shortness of breath.   Gastrointestinal:  Negative for diarrhea, nausea and vomiting.  Skin:        Hair loss  Neurological:  Negative for tremors and weakness.       Balance problems  Psychiatric/Behavioral:  Negative for agitation, behavioral problems, confusion, decreased concentration, dysphoric mood, hallucinations, self-injury, sleep disturbance and suicidal ideas. The patient is nervous/anxious. The patient is not hyperactive.   Depression resolved  Medications: I have reviewed the patient's current medications.  Current Outpatient Medications  Medication Sig Dispense Refill   ALPRAZolam  (XANAX ) 0.25 MG tablet TAKE 1 AND 1/2 OR 2 TABLET AT NIGHT AND 1 TABLET DAILY AS NEEDED FOR ANXIETY 75 tablet 2   aspirin  (ASPIRIN  CHILDRENS) 81 MG chewable tablet Chew 1 tablet (81 mg total) by mouth daily.     atorvastatin  (LIPITOR) 40 MG tablet TAKE 1 TABLET BY MOUTH EVERY DAY 90 tablet 2   Cariprazine  HCl (VRAYLAR ) 4.5 MG CAPS TAKE 1 CAPSULE (4.5 MG TOTAL) BY MOUTH DAILY AT 12 NOON. 90 capsule 0   cholecalciferol (VITAMIN D3) 25 MCG (1000 UNIT) tablet Take 10,000 Units by mouth.     ipratropium (ATROVENT) 0.03 % nasal spray Place 2 sprays into both nostrils 2 (two) times daily.     JARDIANCE 10 MG TABS tablet Take 10 mg by mouth daily.     ketorolac  (TORADOL ) 15 MG/ML injection Inject 15 mg into the muscle every 8 (eight) hours as needed for moderate pain (pain score 4-6).     lisdexamfetamine ([name] ) 60 MG capsule Take 1 capsule (60 mg total) by mouth every morning. 30 capsule 0   lithium  300 MG tablet TAKE 4 CAPSULES BY MOUTH AT BEDTIME 360 tablet 1   metoprolol  succinate (TOPROL  XL) 25 MG 24 hr tablet Take 0.5 tablets (12.5 mg total) by mouth daily. 45 tablet 2   NURTEC 75 MG TBDP Take 1 tablet by mouth as needed.     ocrelizumab (OCREVUS) 300 MG/10ML injection Inject into the vein.     promethazine  (PHENERGAN ) 25 MG tablet 25 mg every  8 (eight) hours as needed for nausea or vomiting.     rizatriptan  (MAXALT -MLT) 10 MG disintegrating tablet Take 1 tablet (10 mg total) by mouth as needed for migraine. May repeat in 2 hours if needed 9 tablet 11   spironolactone (ALDACTONE) 100 MG tablet Take 100 mg by mouth daily.     SPRINTEC 28 0.25-35 MG-MCG tablet      No current facility-administered medications for this visit.    Medication Side Effects: .diarrhea resolved.  [name]  runny nose improved with dose reduction  Allergies: No Known Allergies  Past Medical History:  Diagnosis Date   Anxiety    Depression    Diabetes type 2, controlled (HCC)    Hyperlipidemia    Migraine    Multiple sclerosis    receives Tysabri  infusion q 28 days    Family History  Problem Relation Age of Onset   Diabetes Mother    Hypertension Mother    Dementia Mother    Bipolar disorder Mother    Heart attack Father 41   Heart failure Father    Hypertension Father    Heart disease Father    Stroke Father  Social History   Socioeconomic History   Marital status: Married    Spouse name: Donnell   Number of children: 1   Years of education: MA   Highest education level: Not on file  Occupational History    Employer: [employer]  Tobacco Use   Smoking status: Former    Current packs/day: 0.50    Average packs/day: 0.5 packs/day for 18.8 years (9.4 ttl pk-yrs)    Types: Cigarettes    Start date: 2007   Smokeless tobacco: Never  Vaping Use   Vaping status: Never Used  Substance and Sexual Activity   Alcohol use: Yes    Comment: socially; once a week   Drug use: No   Sexual activity: Yes    Birth control/protection: Pill  Other Topics Concern   Not on file  Social History Narrative   Patient lives at home with family.   Caffeine Use: 1-2 cups daily   No tobacco, recreational drugs. Social drinking only.   Wears her seatbelt, has smoke alarm in the home.   Feels safe in her relationships.   Social Drivers of  Corporate investment banker Strain: Not on file  Food Insecurity: No Food Insecurity (06/09/2021)   Received from [facility]   Hunger Vital Sign    Within the past 12 months, you worried that your food would run out before you got the money to buy more.: Never true    Within the past 12 months, the food you bought just didn't last and you didn't have money to get more.: Never true  Transportation Needs: Not on file  Physical Activity: Not on file  Stress: Not on file  Social Connections: Unknown (09/06/2021)   Received from [facility]   Social Network    Social Network: Not on file  Intimate Partner Violence: Unknown (08/12/2021)   Received from [facility]   HITS    Physically Hurt: Not on file    Insult or Talk Down To: Not on file    Threaten Physical Harm: Not on file    Scream or Curse: Not on file    Past Medical History, Surgical history, Social history, and Family history were reviewed and updated as appropriate.   Please see review of systems for further details on the patient's review from today.   Objective:   Physical Exam:  There were no vitals taken for this visit.  Physical Exam Constitutional:      General: She is not in acute distress. Musculoskeletal:        General: No deformity.  Neurological:     Mental Status: She is alert and oriented to person, place, and time.     Cranial Nerves: No dysarthria.     Coordination: Coordination normal.  Psychiatric:        Attention and Perception: Attention and perception normal. She does not perceive auditory or visual hallucinations.        Mood and Affect: Mood is anxious. Mood is not depressed. Affect is not angry or tearful.        Speech: Speech is not rapid and pressured.        Behavior: Behavior is not agitated, aggressive or hyperactive. Behavior is cooperative.        Thought Content: Thought content normal. Thought content is not paranoid or delusional. Thought content does not include homicidal or  suicidal ideation. Thought content does not include suicidal plan.        Cognition and Memory: Cognition  and memory normal.        Judgment: Judgment normal.     Comments: Insight intact No mania  Generally euthymic and anxiety manageable.     Lab Review:     Component Value Date/Time   NA 136 01/24/2023 1952   NA 137 04/15/2014 1554   K 3.9 01/24/2023 1952   CL 103 01/24/2023 1952   CO2 23 01/24/2023 1952   GLUCOSE 172 (H) 01/24/2023 1952   BUN 12 01/24/2023 1952   BUN 15 04/15/2014 1554   CREATININE 0.74 01/24/2023 1952   CREATININE 0.81 09/29/2022 1014   CALCIUM  9.0 01/24/2023 1952   PROT 6.2 (L) 10/22/2016 1009   PROT 6.9 12/29/2015 1400   ALBUMIN 3.3 (L) 10/22/2016 1009   ALBUMIN 4.5 12/29/2015 1400   AST 26 10/22/2016 1009   ALT 25 10/22/2016 1009   ALKPHOS 81 10/22/2016 1009   BILITOT 1.0 10/22/2016 1009   BILITOT 0.6 12/29/2015 1400   GFRNONAA >60 01/24/2023 1952   GFRAA >60 12/25/2016 1254       Component Value Date/Time   WBC 10.0 01/24/2023 1952   RBC 4.32 01/24/2023 1952   HGB 12.8 01/24/2023 1952   HGB 11.9 06/30/2016 1122   HCT 37.5 01/24/2023 1952   HCT 36.2 06/30/2016 1122   PLT 323 01/24/2023 1952   PLT 237 06/30/2016 1122   MCV 86.8 01/24/2023 1952   MCV 81 06/30/2016 1122   MCH 29.6 01/24/2023 1952   MCHC 34.1 01/24/2023 1952   RDW 12.7 01/24/2023 1952   RDW 14.6 06/30/2016 1122   LYMPHSABS 1.6 10/22/2016 1009   LYMPHSABS 3.6 (H) 06/30/2016 1122   MONOABS 0.3 10/22/2016 1009   EOSABS 0.1 10/22/2016 1009   EOSABS 0.1 06/30/2016 1122   BASOSABS 0.0 10/22/2016 1009   BASOSABS 0.0 06/30/2016 1122    Lithium  Lvl  Date Value Ref Range Status  09/29/2022 0.8 0.6 - 1.2 mmol/L Final  09/29/22 lithium  0.8 on 1200 08/13/20 lithium  1200= level 1.2 No results found for: PHENYTOIN, PHENOBARB, VALPROATE, CBMZ   .res Assessment: Plan:     Nao was seen today for follow-up, depression, anxiety, add and manic behavior.  Diagnoses and  all orders for this visit:  Bipolar I disorder, most recent episode mixed (HCC) -     Lithium  level  Panic disorder with agoraphobia  Attention deficit hyperactivity disorder (ADHD), combined type  Generalized anxiety disorder  Insomnia due to mental condition  Lithium  use   30-minute face to face time with patient was spent on counseling and coordination of care. We discussed her chronically unstable bipolar disorder with a history of abrupt worsening of depression with suicidal ideation.  Most recently she has been manic. Has tended to be sensitive and have difficulty with various medications and hence has taken multiple medications as noted.   Overall mood sx were stable on [name]  120 mg daily but she complained of rhinorrhea which was persistent and the dose was gradually reduced to 60 mg daily at which point she began to have rapid mixed cycling.  She does not want to go back up in the [name]  and wanted to try something else.  She had not been on Vraylar  before.  We discussed the side effects in detail. Mania resolved on Vraylar  6 mg daily.  Based on her history she may have to go higher than that but we need to be cautious because of her history of intolerance of various medications.  Of note, the hx irritability is not  due to [name]  because she switched to Ritalin and the irritability was unchanged. [name]  being taken from neuro for MS fatigue.  MS stable.  She agrees to continue the lithium  1200 mg daily.  Repeat lithium  level. 5/24 =0.8 level Repeat sooner than 6 mos if loses 15# or more with ozempic.   Diarrhea resolved with colestipol.  She stopped this bc of other reasons but tolerating the lithium . Counseled patient regarding potential benefits, risks, and side effects of lithium  to include potential risk of lithium  affecting thyroid  and renal function.  Discussed need for periodic lab monitoring to determine drug level and to assess for potential adverse effects.   Counseled patient regarding signs and symptoms of lithium  toxicity and advised that they notify office immediately or seek urgent medical attention if experiencing these signs and symptoms.  Patient advised to contact office with any questions or concerns.  We discussed the short-term risks associated with benzodiazepines including sedation and increased fall risk among others.  Discussed long-term side effect risk including dependence, potential withdrawal symptoms, and the potential eventual dose-related risk of dementia.  But recent studies from 2020 dispute this association between benzodiazepines and dementia risk. Newer studies in 2020 do not support an association with dementia.  No med changes.  Continue : Alprazolam  0.5 mg daily as needed anxiety, Vraylar  4.5 mg daily, [name]  60 mg a.m., lithium  1200 mg nightly,  Follow-up 4-5 mos  Lorene Macintosh MD, DFAPA   Please see After Visit Summary for patient specific instructions.  No future appointments.       Orders Placed This Encounter  Procedures   Lithium  level       -------------------------------

## 2024-04-21 ENCOUNTER — Other Ambulatory Visit: Payer: Self-pay | Admitting: Physician Assistant

## 2024-05-11 ENCOUNTER — Other Ambulatory Visit: Payer: Self-pay | Admitting: Psychiatry

## 2024-05-11 DIAGNOSIS — F3160 Bipolar disorder, current episode mixed, unspecified: Secondary | ICD-10-CM

## 2024-05-11 DIAGNOSIS — F3112 Bipolar disorder, current episode manic without psychotic features, moderate: Secondary | ICD-10-CM

## 2024-05-15 ENCOUNTER — Other Ambulatory Visit: Payer: Self-pay | Admitting: Psychiatry

## 2024-05-15 DIAGNOSIS — F3160 Bipolar disorder, current episode mixed, unspecified: Secondary | ICD-10-CM

## 2024-05-23 ENCOUNTER — Other Ambulatory Visit: Payer: Self-pay | Admitting: Psychiatry

## 2024-05-23 DIAGNOSIS — F4001 Agoraphobia with panic disorder: Secondary | ICD-10-CM

## 2024-06-26 ENCOUNTER — Ambulatory Visit (INDEPENDENT_AMBULATORY_CARE_PROVIDER_SITE_OTHER): Admitting: Psychiatry
